# Patient Record
Sex: Female | Born: 1950 | Race: White | Hispanic: No | Smoking: Current every day smoker
Health system: Southern US, Community
[De-identification: ages and names within clinical notes are randomized; demographics above are authoritative.]

## PROBLEM LIST (undated history)

## (undated) DIAGNOSIS — I10 Essential (primary) hypertension: Secondary | ICD-10-CM

## (undated) DIAGNOSIS — T7840XA Allergy, unspecified, initial encounter: Secondary | ICD-10-CM

## (undated) DIAGNOSIS — F1011 Alcohol abuse, in remission: Secondary | ICD-10-CM

## (undated) DIAGNOSIS — F419 Anxiety disorder, unspecified: Secondary | ICD-10-CM

## (undated) DIAGNOSIS — F329 Major depressive disorder, single episode, unspecified: Secondary | ICD-10-CM

## (undated) DIAGNOSIS — Z8616 Personal history of COVID-19: Secondary | ICD-10-CM

## (undated) HISTORY — DX: Personal history of COVID-19: Z86.16

## (undated) HISTORY — DX: Anxiety disorder, unspecified: F41.9

## (undated) HISTORY — PX: COLONOSCOPY: SHX174

## (undated) HISTORY — DX: Essential (primary) hypertension: I10

## (undated) HISTORY — PX: ABLATION: SHX5711

## (undated) HISTORY — PX: DILATION AND CURETTAGE OF UTERUS: SHX78

## (undated) HISTORY — PX: ANAL FISSURE REPAIR: SHX2312

## (undated) HISTORY — DX: Alcohol abuse, in remission: F10.11

## (undated) HISTORY — DX: Allergy, unspecified, initial encounter: T78.40XA

## (undated) HISTORY — PX: TONSILLECTOMY: SUR1361

## (undated) HISTORY — DX: Major depressive disorder, single episode, unspecified: F32.9

---

## 1997-08-27 ENCOUNTER — Other Ambulatory Visit: Admission: RE | Admit: 1997-08-27 | Discharge: 1997-08-27 | Payer: Self-pay | Admitting: Family Medicine

## 1997-11-12 ENCOUNTER — Other Ambulatory Visit: Admission: RE | Admit: 1997-11-12 | Discharge: 1997-11-12 | Payer: Self-pay | Admitting: Family Medicine

## 2000-02-06 ENCOUNTER — Other Ambulatory Visit: Admission: RE | Admit: 2000-02-06 | Discharge: 2000-02-06 | Payer: Self-pay | Admitting: Obstetrics & Gynecology

## 2000-03-31 ENCOUNTER — Ambulatory Visit (HOSPITAL_COMMUNITY): Admission: RE | Admit: 2000-03-31 | Discharge: 2000-03-31 | Payer: Self-pay | Admitting: Obstetrics & Gynecology

## 2000-03-31 ENCOUNTER — Encounter (INDEPENDENT_AMBULATORY_CARE_PROVIDER_SITE_OTHER): Payer: Self-pay

## 2004-02-23 ENCOUNTER — Other Ambulatory Visit: Admission: RE | Admit: 2004-02-23 | Discharge: 2004-02-23 | Payer: Self-pay | Admitting: Obstetrics and Gynecology

## 2004-04-29 ENCOUNTER — Ambulatory Visit: Payer: Self-pay | Admitting: Cardiology

## 2004-07-06 ENCOUNTER — Ambulatory Visit: Payer: Self-pay | Admitting: Cardiology

## 2004-07-14 ENCOUNTER — Ambulatory Visit: Payer: Self-pay | Admitting: Cardiology

## 2004-08-04 ENCOUNTER — Ambulatory Visit: Payer: Self-pay | Admitting: Family Medicine

## 2004-08-16 ENCOUNTER — Ambulatory Visit: Payer: Self-pay | Admitting: Family Medicine

## 2004-10-18 ENCOUNTER — Ambulatory Visit: Payer: Self-pay | Admitting: Cardiology

## 2005-01-20 ENCOUNTER — Ambulatory Visit: Payer: Self-pay | Admitting: Cardiology

## 2005-07-20 ENCOUNTER — Other Ambulatory Visit: Admission: RE | Admit: 2005-07-20 | Discharge: 2005-07-20 | Payer: Self-pay | Admitting: Obstetrics & Gynecology

## 2006-03-08 ENCOUNTER — Encounter: Admission: RE | Admit: 2006-03-08 | Discharge: 2006-03-08 | Payer: Self-pay | Admitting: Obstetrics & Gynecology

## 2007-04-04 ENCOUNTER — Ambulatory Visit: Payer: Self-pay | Admitting: Cardiology

## 2007-11-05 ENCOUNTER — Ambulatory Visit: Payer: Self-pay | Admitting: Family Medicine

## 2007-11-05 DIAGNOSIS — J069 Acute upper respiratory infection, unspecified: Secondary | ICD-10-CM | POA: Insufficient documentation

## 2008-03-25 ENCOUNTER — Ambulatory Visit: Payer: Self-pay | Admitting: Cardiology

## 2009-02-03 ENCOUNTER — Telehealth (INDEPENDENT_AMBULATORY_CARE_PROVIDER_SITE_OTHER): Payer: Self-pay | Admitting: Internal Medicine

## 2009-03-24 ENCOUNTER — Telehealth: Payer: Self-pay | Admitting: Cardiology

## 2009-04-02 ENCOUNTER — Encounter (INDEPENDENT_AMBULATORY_CARE_PROVIDER_SITE_OTHER): Payer: Self-pay | Admitting: *Deleted

## 2009-04-27 DIAGNOSIS — E785 Hyperlipidemia, unspecified: Secondary | ICD-10-CM | POA: Insufficient documentation

## 2009-04-27 DIAGNOSIS — I10 Essential (primary) hypertension: Secondary | ICD-10-CM | POA: Insufficient documentation

## 2009-04-27 HISTORY — DX: Hyperlipidemia, unspecified: E78.5

## 2009-04-27 HISTORY — DX: Essential (primary) hypertension: I10

## 2009-06-09 ENCOUNTER — Encounter (INDEPENDENT_AMBULATORY_CARE_PROVIDER_SITE_OTHER): Payer: Self-pay | Admitting: *Deleted

## 2010-06-14 NOTE — Letter (Signed)
Summary: Appointment - Missed  [facility]     [place], [place]    Phone:   Fax:      June 09, 2009 MRN: [id number]   [facility] [address] [place], [place]  [address]   Dear [name] [name],  Our records indicate you missed your appointment on 06-08-2009 with  Dr. Daleen Squibb  It is very important that we reach you to reschedule this appointment. We look forward to participating in your health care needs. Please contact us at the number listed above at your earliest convenience to reschedule this appointment.     Sincerely,   Lorne Skeens  [facility] Scheduling Team

## 2010-09-27 NOTE — Assessment & Plan Note (Signed)
[facility]                            CARDIOLOGY OFFICE NOTE   NAME:Grotz, SHARYAH BOSTWICK                         MRN:          [id number]  DATE:04/04/2007                            DOB:          1950-06-28    Ms. Teems returns today for further management of her multiple  cardiovascular risk factors.   It has been a couple of years since I have last seen her.   She says don't yell at me 'cause I stopped my statin, and I'm still  smoking.   She has had no symptoms of angina or ischemia.   Unfortunately, her last lipid profile showed a total cholesterol of 244,  HDL of 39, LDL of 178, triglycerides of 134.  She had stopped her  Vytorin last year because she cramped in her legs and was an unhappy  person.   She is still smoking, but has cut back significantly.  She is not  walking on a regular basis.  She says her blood pressure is under good  control.   MEDICATIONS:  1. Diovan 160/25 daily.  2. Vitamin D 1000 units daily.  3. Caltrate 600 mg daily.  4. Fish oil 1000 mg.  5. Flax seed oil.   PHYSICAL EXAMINATION:  VITAL SIGNS:  Her blood pressure today is 138/78,  pulse 78 and regular, weight is 155.  HEENT:  Normocephalic and atraumatic.  Pupils are equal, round and  reactive to light and accommodation.  Extraocular movements intact.  Sclerae are clear.  Facial symmetry is normal.  Carotid upstrokes are  equal bilaterally without bruits.  No JVD.  Thyroid is not enlarged.  Trachea is midline.  LUNGS:  Clear.  HEART:  Soft S1 and S2 without gallops, rubs, or murmurs.  PMI cannot be  appreciated.  EXTREMITIES:  No clubbing, cyanosis, or edema.  Pulses are intact.  NEUROLOGIC:  Intact.   EKG is normal.   ASSESSMENT AND PLAN:  I had a nice chat with Mrs. Reinoso.  I have  recommended 3 hours of walking per week, try Crestor 10 mg a day, which  she had tried in the past and seemed to tolerate, keep blood pressure at  130/80 and monitored on a  regular basis, and cut cigarettes as much as  possible.   Will check lipids and LFT's in about 2 months.  I will see her back in a  year.     Thomas C. Daleen Squibb, MD, [facility]  Electronically Signed    TCW/MedQ  DD: 04/04/2007  DT: 04/04/2007  Job #: [id number]   cc:   Freddy Finner, M.D.

## 2010-09-27 NOTE — Assessment & Plan Note (Signed)
[facility]                            CARDIOLOGY OFFICE NOTE   NAME:[name], [name] IGOE                         MRN:          [id number]  DATE:03/25/2008                            DOB:          1950/07/27    [name] [name] comes in today for followup of her multiple cardiovascular  risk factors.   She took my message to heart and quit smoking.  She has quit for 6  months!   She is stressed because she has gained some weight.  She also recently  developed some problems with her left ankle and has not been able to  walk.   MEDICATIONS:  She is taking her Crestor 10 mg a day, Diovan HCTZ 160/25  daily.  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  She  takes Caltrate and vitamin D.   She denies any orthopnea, PND, peripheral edema, tachy palpitations, or  chest pain.   PHYSICAL EXAMINATION:  VITAL SIGNS:  Her blood pressure today is 141/87  and her pulse is 67 and regular.  HEENT:  Normal.  NECK:  Carotid upstrokes were equal bilaterally without bruits, no JVD.  Thyroid is not enlarged.  Trachea is midline.  LUNGS:  Clear to auscultation and percussion without rhonchi or rales.  HEART:  A poorly appreciated PMI.  Normal S1 and S2.  ABDOMEN:  Soft, good bowel sounds.  No midline bruits.  EXTREMITIES:  No cyanosis, clubbing, or edema.  Pulses are intact.   Her EKG is normal.   I am delighted that [name] [name] is doing so well.  We will have her  return for a fasting CMP and lipids.  She will continue to take her  current medications.  I have encouraged her to try to drop her weight.  Now, that she has been quit smoking for 6 months.  I will see her back  again in 3 months for accountability.     Thomas C. Daleen Squibb, MD, [facility]  Electronically Signed    TCW/MedQ  DD: 03/25/2008  DT: 03/26/2008  Job #: [id number]   cc:   Freddy Finner, M.D.

## 2010-09-30 NOTE — Op Note (Signed)
[facility]  Patient:    [name] [name], [name] [name]                       MRN: [id number] Proc. Date: 03/31/00 Adm. Date:  [id number] Disc. Date: [id number] Attending:  Minette Headland                           Operative Report  PREOPERATIVE DIAGNOSIS:       Postmenopausal bleeding on iron replacement therapy, echogenic focus within the endometrium possibly consistent with a polyp.  Endometrial stripe thickness 6 mm.  POSTOPERATIVE DIAGNOSIS:      Postmenopausal bleeding on iron replacement therapy, echogenic focus within the endometrium possibly consistent with a polyp.  Endometrial stripe thickness 6 mm.  OPERATIVE PROCEDURE:          Hysteroscopy, dilation and curettage.  SURGEON:                      Freddy Finner, M.D.  ANESTHESIA:                   General.  INTRAOPERATIVE COMPLICATIONS:                None.  ESTIMATED BLOOD LOSS:         20 cc.  INTRAOPERATIVE SORBITOL DEFICIT:                      20 cc.  INDICATIONS:                  Patient is a 60 year old gravida 3, para 2, who was started on ______ for hormone replacement therapy and has had an episode of irregular vaginal bleeding.  This persisted for several days.  A pelvic ultrasound was obtained in the office which showed an area of echogenic focus within the uterine cavity possibly consistent with a polyp and showed an endometrial stripe of 6 mm.  Patient is now admitted for hysteroscopy and D&C. Intraoperative findings were obscured by blood clots within the endometrial cavity but no obvious polyps were seen and no obvious abnormality was seen. The uterus itself sounded to 8 to 8.5 cm.  DESCRIPTION OF PROCEDURE:     Following admission, the patient was brought to the operating room, placed under adequate general anesthesia, and placed in the dorsal lithotomy position using the [facility] stirrup system.  Betadine prep was carried out in the usual fashion.  Sterile drapes were  applied.  The cervix was visualized, grasped with a single toothed tenaculum, and progressively dilated with the [facility] dilators to 23.  The 12.5 degree [facility] hysteroscope was introduced using Sorbitol 3% as the distending medium. Examination of the cavity revealed no definite abnormalities, but it was obscured by clots and fibrin deposition.  Gentle thorough curettage and exploration with Randall stone forceps was accomplished.  The cavity then appeared to be normal on reinspection.  The procedure was terminated at this point.  Patient was awakened, taken to recovery in good condition, and will be discharged in the immediate postoperative period to follow up in approximately two weeks.  She is to call for fever, severe pain, or heavy bleeding.  She is to take Darvocet-N 100 as needed for postoperative pain. DD:  04/03/00 TD:  04/03/00 Job: [id number] LKG/MW102

## 2012-11-12 LAB — HM PAP SMEAR: HM Pap smear: NORMAL

## 2013-02-12 ENCOUNTER — Encounter: Payer: Self-pay | Admitting: Gastroenterology

## 2013-04-07 ENCOUNTER — Ambulatory Visit (AMBULATORY_SURGERY_CENTER): Payer: Self-pay

## 2013-04-07 ENCOUNTER — Encounter: Payer: Self-pay | Admitting: Gastroenterology

## 2013-04-07 VITALS — Ht 60.5 in | Wt 152.0 lb

## 2013-04-07 DIAGNOSIS — Z1211 Encounter for screening for malignant neoplasm of colon: Secondary | ICD-10-CM

## 2013-04-07 MED ORDER — SUPREP BOWEL PREP KIT 17.5-3.13-1.6 GM/177ML PO SOLN: 1.0000 | Freq: Once | ORAL | Status: DC

## 2013-04-22 ENCOUNTER — Ambulatory Visit (AMBULATORY_SURGERY_CENTER): Payer: BC Managed Care – PPO | Admitting: Gastroenterology

## 2013-04-22 ENCOUNTER — Encounter: Payer: Self-pay | Admitting: Gastroenterology

## 2013-04-22 VITALS — BP 104/78 | HR 63 | Temp 97.5°F | Resp 25 | Ht 60.0 in | Wt 152.0 lb

## 2013-04-22 DIAGNOSIS — D126 Benign neoplasm of colon, unspecified: Secondary | ICD-10-CM

## 2013-04-22 DIAGNOSIS — K648 Other hemorrhoids: Secondary | ICD-10-CM

## 2013-04-22 DIAGNOSIS — Z1211 Encounter for screening for malignant neoplasm of colon: Secondary | ICD-10-CM

## 2013-04-22 MED ORDER — SODIUM CHLORIDE 0.9 % IV SOLN: 500.0000 mL | INTRAVENOUS | Status: DC

## 2013-04-22 NOTE — Progress Notes (Signed)
Called to room to assist during endoscopic procedure.  Patient ID and intended procedure confirmed with present staff. Received instructions for my participation in the procedure from the performing physician.  

## 2013-04-22 NOTE — Op Note (Addendum)
[facility] [address] [place], [address]   COLONOSCOPY PROCEDURE REPORT  PATIENT: [name] [name], [name] J.  MR#: [id number] BIRTHDATE: Feb 11, 1951 , 62  yrs. old GENDER: Female ENDOSCOPIST: Louis Meckel, MD REFERRED BY: PROCEDURE DATE:  04/22/2013 PROCEDURE:   Colonoscopy with cold biopsy polypectomy First Screening Colonoscopy - Avg.  risk and is 50 yrs.  old or older Yes.  Prior Negative Screening - Now for repeat screening. N/A  History of Adenoma - Now for follow-up colonoscopy & has been > or = to 3 yrs.  N/A  Polyps Removed Today? Yes. ASA CLASS:   Class II INDICATIONS:average risk screening. MEDICATIONS: MAC sedation, administered by CRNA and propofol (Diprivan) 300mg  IV  DESCRIPTION OF PROCEDURE:   After the risks benefits and alternatives of the procedure were thoroughly explained, informed consent was obtained.  A digital rectal exam revealed several skin tags.   The LB [id number]  endoscope was introduced through the anus and advanced to the cecum, which was identified by both the appendix and ileocecal valve. No adverse events experienced. The quality of the prep was Suprep good  The instrument was then slowly withdrawn as the colon was fully examined.      COLON FINDINGS: Two sessile polyps were found at the hepatic flexure.  A polypectomy was performed with cold forceps.   Internal hemorrhoids were found.   The colon was otherwise normal.  There was no diverticulosis, inflammation, polyps or cancers unless previously stated.  Retroflexed views revealed no abnormalities. The time to cecum=4 minutes 28 seconds.  Withdrawal time=9 minutes 19 seconds.  The scope was withdrawn and the procedure completed. COMPLICATIONS: There were no complications.  ENDOSCOPIC IMPRESSION: 1.   Two sessile polyps were found at the hepatic flexure; polypectomy was performed with cold forceps 2.   Internal hemorrhoids 3.   The colon was otherwise  normal  RECOMMENDATIONS: You will receive a letter within 2 weeks with followup recommendations based on the pathology findings. Band ligation of internal hemorrhoids   eSigned:  Louis Meckel, MD 04/30/2013 2:43 PM Revised: 04/30/2013 2:43 PM  cc:   PATIENT NAME:  [name]. MR#: [id number]

## 2013-04-22 NOTE — Progress Notes (Signed)
Report to pacu rn, vss, bbs=clear 

## 2013-04-22 NOTE — Patient Instructions (Signed)

## 2013-04-22 NOTE — Progress Notes (Signed)
Patient did not experience any of the following events: a burn prior to discharge; a fall within the facility; wrong site/side/patient/procedure/implant event; or a hospital transfer or hospital admission upon discharge from the facility. (G8907) Patient did not have preoperative order for IV antibiotic SSI prophylaxis. (G8918)  

## 2013-04-23 ENCOUNTER — Telehealth: Payer: Self-pay | Admitting: *Deleted

## 2013-04-23 NOTE — Telephone Encounter (Signed)
Message copied by Marlowe Kays on Wed Apr 23, 2013  8:21 AM ------      Message from: Melvia Heaps D      Created: Tue Apr 22, 2013 10:40 AM       Please schedule this patient for band ligation ------

## 2013-04-23 NOTE — Telephone Encounter (Signed)
Name identifier, left message, follow-up 

## 2013-04-28 NOTE — Telephone Encounter (Signed)
Letter mailed today of appointment

## 2013-04-30 ENCOUNTER — Encounter: Payer: Self-pay | Admitting: Gastroenterology

## 2013-06-02 ENCOUNTER — Encounter: Payer: Self-pay | Admitting: Gastroenterology

## 2013-06-02 ENCOUNTER — Ambulatory Visit (INDEPENDENT_AMBULATORY_CARE_PROVIDER_SITE_OTHER): Payer: BC Managed Care – PPO | Admitting: Gastroenterology

## 2013-06-02 VITALS — BP 130/80 | HR 72 | Ht 60.25 in | Wt 155.1 lb

## 2013-06-02 DIAGNOSIS — K648 Other hemorrhoids: Secondary | ICD-10-CM | POA: Insufficient documentation

## 2013-06-02 HISTORY — PX: HEMORRHOID BANDING: SHX5850

## 2013-06-02 NOTE — Progress Notes (Signed)
PROCEDURE NOTE: The patient presents with symptomatic grade *2**  hemorrhoids, requesting rubber band ligation of his/her hemorrhoidal disease.  All risks, benefits and alternative forms of therapy were described and informed consent was obtained.   The anorectum was pre-medicated with lubricant and nitroglycerine ointment The decision was made to band the *right anterior** internal hemorrhoid, and the [place] was used to perform band ligation without complication.  Digital anorectal examination was then performed to assure proper positioning of the band, and to adjust the banded tissue as required.  The patient was discharged home without pain or other issues.  Dietary and behavioral recommendations were given and along with follow-up instructions.    The patient will return in *2** for  follow-up and possible additional banding as required. No complications were encountered and the patient tolerated the procedure well.

## 2013-06-02 NOTE — Patient Instructions (Addendum)
You have been given a separate informational sheet regarding your tobacco use, the importance of quitting and local resources to help you quit.  Your 2nd  banding is scheduled on 06/27/2013 at 10:30am  [place]   1. The procedure you have had should have been relatively painless since the banding of the area involved does not have nerve endings and there is no pain sensation.  The rubber band cuts off the blood supply to the hemorrhoid and the band may fall off as soon as 48 hours after the banding (the band may occasionally be seen in the toilet bowl following a bowel movement). You may notice a temporary feeling of fullness in the rectum which should respond adequately to plain Tylenol or Motrin.  2. Following the banding, avoid strenuous exercise that evening and resume full activity the next day.  A sitz bath (soaking in a warm tub) or bidet is soothing, and can be useful for cleansing the area after bowel movements.     3. To avoid constipation, take two tablespoons of natural wheat bran, natural oat bran, flax, Benefiber or any over the counter fiber supplement and increase your water intake to 7-8 glasses daily.    4. Unless you have been prescribed anorectal medication, do not put anything inside your rectum for two weeks: No suppositories, enemas, fingers, etc.  5. Occasionally, you may have more bleeding than usual after the banding procedure.  This is often from the untreated hemorrhoids rather than the treated one.  Don't be concerned if there is a tablespoon or so of blood.  If there is more blood than this, lie flat with your bottom higher than your head and apply an ice pack to the area. If the bleeding does not stop within a half an hour or if you feel faint, call our office at [phone] or go to the emergency room.  6. Problems are not common; however, if there is a substantial amount of bleeding, severe pain, chills, fever or  difficulty passing urine (very rare) or other problems, you should call us at [phone] or report to the nearest emergency room.  7. Do not stay seated continuously for more than 2-3 hours for a day or two after the procedure.  Tighten your buttock muscles 10-15 times every two hours and take 10-15 deep breaths every 1-2 hours.  Do not spend more than a few minutes on the toilet if you cannot empty your bowel; instead re-visit the toilet at a later time.

## 2013-06-27 ENCOUNTER — Encounter: Payer: BC Managed Care – PPO | Admitting: Gastroenterology

## 2013-07-17 ENCOUNTER — Encounter: Payer: Self-pay | Admitting: Internal Medicine

## 2013-07-17 ENCOUNTER — Encounter: Payer: BC Managed Care – PPO | Admitting: Gastroenterology

## 2013-07-17 ENCOUNTER — Ambulatory Visit (INDEPENDENT_AMBULATORY_CARE_PROVIDER_SITE_OTHER): Payer: BC Managed Care – PPO | Admitting: Internal Medicine

## 2013-07-17 VITALS — BP 118/80 | HR 72 | Temp 98.2°F | Ht 60.5 in | Wt 153.0 lb

## 2013-07-17 DIAGNOSIS — Z0000 Encounter for general adult medical examination without abnormal findings: Secondary | ICD-10-CM

## 2013-07-17 DIAGNOSIS — Z23 Encounter for immunization: Secondary | ICD-10-CM

## 2013-07-17 LAB — COMPREHENSIVE METABOLIC PANEL
ALK PHOS: 46 U/L (ref 39–117)
ALT: 26 U/L (ref 0–35)
AST: 21 U/L (ref 0–37)
Albumin: 4.3 g/dL (ref 3.5–5.2)
BILIRUBIN TOTAL: 0.7 mg/dL (ref 0.3–1.2)
BUN: 16 mg/dL (ref 6–23)
CO2: 28 meq/L (ref 19–32)
CREATININE: 0.7 mg/dL (ref 0.4–1.2)
Calcium: 9.7 mg/dL (ref 8.4–10.5)
Chloride: 101 mEq/L (ref 96–112)
GFR: 87.52 mL/min (ref >60.00)
GLUCOSE: 93 mg/dL (ref 70–99)
Potassium: 3.9 mEq/L (ref 3.5–5.1)
Sodium: 136 mEq/L (ref 135–145)
Total Protein: 7.2 g/dL (ref 6.0–8.3)

## 2013-07-17 LAB — LIPID PANEL
Cholesterol: 275 mg/dL — ABNORMAL HIGH (ref 0–200)
HDL: 41 mg/dL (ref >39.00)
LDL Cholesterol: 209 mg/dL — ABNORMAL HIGH (ref 0–99)
TRIGLYCERIDES: 125 mg/dL (ref 0.0–149.0)
Total CHOL/HDL Ratio: 7
VLDL: 25 mg/dL (ref 0.0–40.0)

## 2013-07-17 LAB — CBC
HEMATOCRIT: 42.6 % (ref 36.0–46.0)
HEMOGLOBIN: 14.1 g/dL (ref 12.0–15.0)
MCHC: 33.2 g/dL (ref 30.0–36.0)
MCV: 98.1 fl (ref 78.0–100.0)
Platelets: 239 10*3/uL (ref 150.0–400.0)
RBC: 4.34 Mil/uL (ref 3.87–5.11)
RDW: 13.9 % (ref 11.5–14.6)
WBC: 7.8 10*3/uL (ref 4.5–10.5)

## 2013-07-17 LAB — HEMOGLOBIN A1C: Hgb A1c MFr Bld: 5.8 % (ref 4.6–6.5)

## 2013-07-17 LAB — TSH: TSH: 3.51 u[IU]/mL (ref 0.35–5.50)

## 2013-07-17 MED ORDER — AMLODIPINE-VALSARTAN-HCTZ 5-160-25 MG PO TABS: 1.0000 | ORAL_TABLET | Freq: Every day | ORAL | Status: DC

## 2013-07-17 NOTE — Addendum Note (Signed)
Addended by: Lurlean Nanny on: 07/17/2013 11:52 AM   Modules accepted: Orders

## 2013-07-17 NOTE — Patient Instructions (Addendum)

## 2013-07-17 NOTE — Progress Notes (Signed)
HPI  Pt presents to the clinic today to establish care. [name] [name] is transferring care from Dr. Nori Riis. [name] [name] has no concerns today.  Flu: 2012 Tetanus: more than 10 years ago Zostovax: never Pap Smear: 01/2013  Mammogram: 01/2013 Colon Screening: 05/2013 Eye Doctor: yearly Dentist: as needed   Past Medical History  Diagnosis Date  . Anxiety   . Hypertension   . Allergy     Current Outpatient Prescriptions  Medication Sig Dispense Refill  . ALPRAZolam (XANAX) 0.25 MG tablet Take 0.125 mg by mouth at bedtime as needed for anxiety.       . Amlodipine-Valsartan-HCTZ 5-160-25 MG TABS Take by mouth.      . B Complex-C (SUPER B COMPLEX PO) Take 1 capsule by mouth daily as needed.      . Cholecalciferol (VITAMIN D-3 PO) Take 1 capsule by mouth daily.      . Flaxseed, Linseed, (FLAX SEEDS PO) Take 1 capsule by mouth daily.      . Multiple Vitamin (MULTIVITAMIN) tablet Take 1 tablet by mouth daily.      . Multiple Vitamins-Minerals (HAIR/SKIN/NAILS PO) Take 1 capsule by mouth daily.      . Omega-3 Fatty Acids (FISH OIL) 1000 MG CAPS Take 2 capsules by mouth daily.      Marland Kitchen oxyCODONE-acetaminophen (PERCOCET/ROXICET) 5-325 MG per tablet Take 0.5 tablets by mouth every 4 (four) hours as needed for severe pain (after eye surgery).       No current facility-administered medications for this visit.    Allergies  Allergen Reactions  . Erythromycin     REACTION: GI upset    Family History  Problem Relation Age of Onset  . Colon cancer Neg Hx   . Diabetes Mother   . Stroke Mother   . Other Mother     colon sugery  . Polymyalgia rheumatica Mother   . Heart disease Father   . Diabetes Father     History   Social History  . Marital Status: Married    Spouse Name: N/A    Number of Children: 2  . Years of Education: N/A   Occupational History  . self employed Other   Social History Main Topics  . Smoking status: Current Every Day Smoker    Types: Cigarettes  . Smokeless tobacco: Never  Used  . Alcohol Use: 6.0 oz/week    10 Cans of beer per week  . Drug Use: No  . Sexual Activity: Not on file   Other Topics Concern  . Not on file   Social History Narrative  . No narrative on file    ROS:  Constitutional: Denies fever, malaise, fatigue, headache or abrupt weight changes.  HEENT: Denies eye pain, eye redness, ear pain, ringing in the ears, wax buildup, runny nose, nasal congestion, bloody nose, or sore throat. Respiratory: Denies difficulty breathing, shortness of breath, cough or sputum production.   Cardiovascular: Denies chest pain, chest tightness, palpitations or swelling in the hands or feet.  Gastrointestinal: Denies abdominal pain, bloating, constipation, diarrhea or blood in the stool.  GU: Denies frequency, urgency, pain with urination, blood in urine, odor or discharge. Musculoskeletal: Denies decrease in range of motion, difficulty with gait, muscle pain or joint pain and swelling.  Skin: Denies redness, rashes, lesions or ulcercations.  Neurological: Denies dizziness, difficulty with memory, difficulty with speech or problems with balance and coordination.   No other specific complaints in a complete review of systems (except as listed in HPI above).  PE:  BP 118/80  Pulse 72  Temp(Src) 98.2 F (36.8 C) (Oral)  Ht 5' 0.5" (1.537 m)  Wt 153 lb (69.4 kg)  BMI 29.38 kg/m2  SpO2 99% Wt Readings from Last 3 Encounters:  07/17/13 153 lb (69.4 kg)  06/02/13 155 lb 2 oz (70.364 kg)  04/22/13 152 lb (68.947 kg)    General: Appears her stated age, overweight but well developed, well nourished in NAD. HEENT: Head: normal shape and size; Eyes: sclera white, no icterus, conjunctiva pink, PERRLA and EOMs intact; Ears: Tm's gray and intact, normal light reflex; Nose: mucosa pink and moist, septum midline; Throat/Mouth: Teeth present, mucosa pink and moist, no lesions or ulcerations noted.  Neck: Normal range of motion. Neck supple, trachea midline. No  massses, lumps or thyromegaly present.  Cardiovascular: Normal rate and rhythm. S1,S2 noted.  No murmur, rubs or gallops noted. No JVD or BLE edema. No carotid bruits noted. Pulmonary/Chest: Normal effort and positive vesicular breath sounds. No respiratory distress. No wheezes, rales or ronchi noted.  Abdomen: Soft and nontender. Normal bowel sounds, no bruits noted. No distention or masses noted. Liver, spleen and kidneys non palpable. Musculoskeletal: Normal range of motion. No signs of joint swelling. No difficulty with gait.  Neurological: Alert and oriented. Cranial nerves II-XII intact. Coordination normal. +DTRs bilaterally. Psychiatric: Mood and affect normal. Behavior is normal. Judgment and thought content normal.      Assessment and Plan:  Prevent Health Maintenance:  Pt declines flu vaccine today Will give Tdap today [name] [name] will find out about cost of shingles vaccine Will obtain screening labs today Encouraged her to stop smoking Encouraged her to work on diet and exercise  RTC in 1 year or sooner if needed

## 2013-07-17 NOTE — Progress Notes (Signed)
Pre visit review using our clinic review tool, if applicable. No additional management support is needed unless otherwise documented below in the visit note. 

## 2013-07-17 NOTE — Addendum Note (Signed)
Addended by: Lurlean Nanny on: 07/17/2013 11:55 AM   Modules accepted: Orders

## 2014-01-27 ENCOUNTER — Telehealth: Payer: Self-pay

## 2014-01-27 MED ORDER — VALSARTAN-HYDROCHLOROTHIAZIDE 160-25 MG PO TABS: 1.0000 | ORAL_TABLET | Freq: Every day | ORAL | Status: DC

## 2014-01-27 NOTE — Telephone Encounter (Signed)
Pt left v/m requesting refill valsartan-HCTZ 160-25 to [facility] [place]; spoke with Seth Bake at [facility] and pt has been getting valsartan- HCTZ 160-25; pts med list has amlodipine-losartan-HCTZ 5-160-25.Please advise. Pt request cb when refilled.

## 2014-01-27 NOTE — Telephone Encounter (Signed)
Please refill valsartan hct 160-25. Take other medication off med list

## 2014-01-27 NOTE — Addendum Note (Signed)
Addended by: Lurlean Nanny on: 01/27/2014 11:43 AM   Modules accepted: Orders

## 2014-01-27 NOTE — Telephone Encounter (Signed)
Rx changed and sent to pharmacy as instructed

## 2014-10-22 ENCOUNTER — Encounter: Payer: Self-pay | Admitting: Internal Medicine

## 2014-10-22 ENCOUNTER — Ambulatory Visit (INDEPENDENT_AMBULATORY_CARE_PROVIDER_SITE_OTHER): Payer: 59 | Admitting: Internal Medicine

## 2014-10-22 VITALS — BP 126/82 | HR 71 | Temp 98.3°F | Wt 142.0 lb

## 2014-10-22 DIAGNOSIS — F411 Generalized anxiety disorder: Secondary | ICD-10-CM | POA: Insufficient documentation

## 2014-10-22 DIAGNOSIS — E785 Hyperlipidemia, unspecified: Secondary | ICD-10-CM | POA: Diagnosis not present

## 2014-10-22 DIAGNOSIS — F329 Major depressive disorder, single episode, unspecified: Secondary | ICD-10-CM | POA: Insufficient documentation

## 2014-10-22 DIAGNOSIS — I10 Essential (primary) hypertension: Secondary | ICD-10-CM | POA: Diagnosis not present

## 2014-10-22 DIAGNOSIS — T753XXA Motion sickness, initial encounter: Secondary | ICD-10-CM

## 2014-10-22 DIAGNOSIS — F419 Anxiety disorder, unspecified: Secondary | ICD-10-CM | POA: Insufficient documentation

## 2014-10-22 HISTORY — DX: Generalized anxiety disorder: F41.1

## 2014-10-22 LAB — CBC
HEMATOCRIT: 43.2 % (ref 36.0–46.0)
Hemoglobin: 14.6 g/dL (ref 12.0–15.0)
MCHC: 33.7 g/dL (ref 30.0–36.0)
MCV: 98.5 fl (ref 78.0–100.0)
PLATELETS: 259 10*3/uL (ref 150.0–400.0)
RBC: 4.39 Mil/uL (ref 3.87–5.11)
RDW: 14.1 % (ref 11.5–15.5)
WBC: 9.5 10*3/uL (ref 4.0–10.5)

## 2014-10-22 LAB — COMPREHENSIVE METABOLIC PANEL
ALBUMIN: 4.6 g/dL (ref 3.5–5.2)
ALK PHOS: 56 U/L (ref 39–117)
ALT: 23 U/L (ref 0–35)
AST: 20 U/L (ref 0–37)
BUN: 21 mg/dL (ref 6–23)
CHLORIDE: 100 meq/L (ref 96–112)
CO2: 30 mEq/L (ref 19–32)
Calcium: 9.9 mg/dL (ref 8.4–10.5)
Creatinine, Ser: 0.66 mg/dL (ref 0.40–1.20)
GFR: 96.36 mL/min (ref >60.00)
Glucose, Bld: 95 mg/dL (ref 70–99)
Potassium: 3.7 mEq/L (ref 3.5–5.1)
Sodium: 135 mEq/L (ref 135–145)
TOTAL PROTEIN: 7.4 g/dL (ref 6.0–8.3)
Total Bilirubin: 0.4 mg/dL (ref 0.2–1.2)

## 2014-10-22 LAB — LIPID PANEL
CHOL/HDL RATIO: 6
CHOLESTEROL: 287 mg/dL — AB (ref 0–200)
HDL: 51.4 mg/dL (ref >39.00)
LDL CALC: 219 mg/dL — AB (ref 0–99)
NonHDL: 235.6
Triglycerides: 81 mg/dL (ref 0.0–149.0)
VLDL: 16.2 mg/dL (ref 0.0–40.0)

## 2014-10-22 MED ORDER — ALPRAZOLAM 0.25 MG PO TABS: 0.1250 mg | ORAL_TABLET | Freq: Every evening | ORAL | Status: DC | PRN

## 2014-10-22 MED ORDER — VALSARTAN-HYDROCHLOROTHIAZIDE 160-25 MG PO TABS: 1.0000 | ORAL_TABLET | Freq: Every day | ORAL | Status: DC

## 2014-10-22 MED ORDER — SCOPOLAMINE 1 MG/3DAYS TD PT72: 1.0000 | MEDICATED_PATCH | TRANSDERMAL | Status: DC

## 2014-10-22 NOTE — Patient Instructions (Signed)

## 2014-10-22 NOTE — Progress Notes (Signed)
Subjective:    Patient ID: Golden Circle, female    DOB: 18-Jul-1950, 64 y.o.   MRN: [id number]  HPI  Pt presents to the clinic today to follow up chronic medical condtions:  HTN: She is taking the Valsartan HCT daily as prescribed. She has not noticed any adverse effects. Her BP today is 126/82.  HLD: Her last LDL was 209. She is not on a statin (intolerant) but reports she takes red yeast rice and fish oil. She does try to consume a low fat diet.  Anxiety: Chronic but stable. She lost both of her parents in the last 96 months. She takes Xanax, only 90 tablets in the last 2 years. She does break them in half. She denies depression, SI/HI.  She reports she is going on a month long vacation to [place]. She would like a Scopolamine patch to prevent motion sickness for the flight. Review of Systems  Past Medical History  Diagnosis Date  . Anxiety   . Hypertension   . Allergy     Current Outpatient Prescriptions  Medication Sig Dispense Refill  . ALPRAZolam (XANAX) 0.25 MG tablet Take 0.125 mg by mouth at bedtime as needed for anxiety.     . B Complex-C (SUPER B COMPLEX PO) Take 1 capsule by mouth daily as needed.    . Cholecalciferol (VITAMIN D-3 PO) Take 1 capsule by mouth daily.    . Flaxseed, Linseed, (FLAX SEEDS PO) Take 1 capsule by mouth daily.    . Multiple Vitamin (MULTIVITAMIN) tablet Take 1 tablet by mouth daily.    . Multiple Vitamins-Minerals (HAIR/SKIN/NAILS PO) Take 1 capsule by mouth daily.    . Omega-3 Fatty Acids (FISH OIL) 1000 MG CAPS Take 2 capsules by mouth daily.    Marland Kitchen oxyCODONE-acetaminophen (PERCOCET/ROXICET) 5-325 MG per tablet Take 0.5 tablets by mouth every 4 (four) hours as needed for severe pain (after eye surgery).    . valsartan-hydrochlorothiazide (DIOVAN-HCT) 160-25 MG per tablet Take 1 tablet by mouth daily. 90 tablet 2   No current facility-administered medications for this visit.    Allergies  Allergen Reactions  . Erythromycin    REACTION: GI upset    Family History  Problem Relation Age of Onset  . Colon cancer Neg Hx   . Cancer Neg Hx   . Diabetes Mother   . Stroke Mother   . Other Mother     colon sugery  . Polymyalgia rheumatica Mother   . Heart disease Father   . Diabetes Father     History   Social History  . Marital Status: Married    Spouse Name: N/A  . Number of Children: 2  . Years of Education: N/A   Occupational History  . self employed Other   Social History Main Topics  . Smoking status: Current Every Day Smoker -- 0.75 packs/day    Types: Cigarettes  . Smokeless tobacco: Never Used  . Alcohol Use: 6.0 oz/week    10 Cans of beer per week  . Drug Use: No  . Sexual Activity: Not on file   Other Topics Concern  . Not on file   Social History Narrative     Constitutional: Denies fever, malaise, fatigue, headache or abrupt weight changes.  Respiratory: Denies difficulty breathing, shortness of breath, cough or sputum production.   Cardiovascular: Denies chest pain, chest tightness, palpitations or swelling in the hands or feet.  Skin: Denies redness, rashes, lesions or ulcercations.  Neurological: Denies dizziness, difficulty with memory,  difficulty with speech or problems with balance and coordination.  Psych: Pt reports anxiety. Denies depression, SI/HI.  No other specific complaints in a complete review of systems (except as listed in HPI above).     Objective:   Physical Exam  BP 126/82 mmHg  Pulse 71  Temp(Src) 98.3 F (36.8 C) (Oral)  Wt 142 lb (64.411 kg)  SpO2 99% Wt Readings from Last 3 Encounters:  10/22/14 142 lb (64.411 kg)  07/17/13 153 lb (69.4 kg)  06/02/13 155 lb 2 oz (70.364 kg)    General: Appears her stated age, well developed, well nourished in NAD. Skin: Warm, dry and intact. No rashes, lesions or ulcerations noted. Cardiovascular: Normal rate and rhythm. S1,S2 noted.  No murmur, rubs or gallops noted. No BLE edema. No carotid bruits  noted. Pulmonary/Chest: Normal effort and positive vesicular breath sounds. No respiratory distress. No wheezes, rales or ronchi noted.  eys non palpable. Neurological: Alert and oriented.  Psychiatric: Mood and affect normal. Behavior is normal. Judgment and thought content normal.     BMET    Component Value Date/Time   NA 136 07/17/2013 1154   K 3.9 07/17/2013 1154   CL 101 07/17/2013 1154   CO2 28 07/17/2013 1154   GLUCOSE 93 07/17/2013 1154   BUN 16 07/17/2013 1154   CREATININE 0.7 07/17/2013 1154   CALCIUM 9.7 07/17/2013 1154    Lipid Panel     Component Value Date/Time   CHOL 275* 07/17/2013 1154   TRIG 125.0 07/17/2013 1154   HDL 41.00 07/17/2013 1154   CHOLHDL 7 07/17/2013 1154   VLDL 25.0 07/17/2013 1154   LDLCALC 209* 07/17/2013 1154    CBC    Component Value Date/Time   WBC 7.8 07/17/2013 1154   RBC 4.34 07/17/2013 1154   HGB 14.1 07/17/2013 1154   HCT 42.6 07/17/2013 1154   PLT 239.0 07/17/2013 1154   MCV 98.1 07/17/2013 1154   MCHC 33.2 07/17/2013 1154   RDW 13.9 07/17/2013 1154    Hgb A1C Lab Results  Component Value Date   HGBA1C 5.8 07/17/2013         Assessment & Plan:   Motion Sickness:  eRx for Scopolamine patch  Make an appt for your annual exam, RTC in 6 months to follow up chronic conditions

## 2014-10-22 NOTE — Assessment & Plan Note (Signed)
Well controlled on Diovan HCT Will check CBC and CMET today Medication refilled per request

## 2014-10-22 NOTE — Progress Notes (Signed)
Pre visit review using our clinic review tool, if applicable. No additional management support is needed unless otherwise documented below in the visit note. 

## 2014-10-22 NOTE — Assessment & Plan Note (Signed)
LDL not a goal She is statin intolerant Will check Lipid profile and CMET today Continue red yeast rice and fish oil

## 2014-10-22 NOTE — Assessment & Plan Note (Signed)
Controlled with prn Xanax CMET today  Medication refilled.

## 2014-12-03 ENCOUNTER — Telehealth: Payer: Self-pay

## 2014-12-03 NOTE — Telephone Encounter (Signed)
Called patient but was unable to reach. I left a voicemail notifying patient that she was due for a mammogram. Will await call back.

## 2014-12-24 ENCOUNTER — Other Ambulatory Visit: Payer: Self-pay | Admitting: Internal Medicine

## 2014-12-24 DIAGNOSIS — Z0000 Encounter for general adult medical examination without abnormal findings: Secondary | ICD-10-CM

## 2014-12-30 ENCOUNTER — Other Ambulatory Visit (INDEPENDENT_AMBULATORY_CARE_PROVIDER_SITE_OTHER): Payer: 59

## 2014-12-30 DIAGNOSIS — Z0000 Encounter for general adult medical examination without abnormal findings: Secondary | ICD-10-CM

## 2014-12-30 LAB — LIPID PANEL
CHOL/HDL RATIO: 7
Cholesterol: 281 mg/dL — ABNORMAL HIGH (ref 0–200)
HDL: 41 mg/dL (ref >39.00)
LDL Cholesterol: 212 mg/dL — ABNORMAL HIGH (ref 0–99)
NONHDL: 240.3
TRIGLYCERIDES: 144 mg/dL (ref 0.0–149.0)
VLDL: 28.8 mg/dL (ref 0.0–40.0)

## 2014-12-30 LAB — CBC
HEMATOCRIT: 43.8 % (ref 36.0–46.0)
HEMOGLOBIN: 14.9 g/dL (ref 12.0–15.0)
MCHC: 34 g/dL (ref 30.0–36.0)
MCV: 98 fl (ref 78.0–100.0)
PLATELETS: 260 10*3/uL (ref 150.0–400.0)
RBC: 4.47 Mil/uL (ref 3.87–5.11)
RDW: 14 % (ref 11.5–15.5)
WBC: 7.5 10*3/uL (ref 4.0–10.5)

## 2014-12-30 LAB — COMPREHENSIVE METABOLIC PANEL
ALT: 18 U/L (ref 0–35)
AST: 19 U/L (ref 0–37)
Albumin: 4.4 g/dL (ref 3.5–5.2)
Alkaline Phosphatase: 49 U/L (ref 39–117)
BILIRUBIN TOTAL: 0.6 mg/dL (ref 0.2–1.2)
BUN: 18 mg/dL (ref 6–23)
CALCIUM: 9.8 mg/dL (ref 8.4–10.5)
CO2: 30 meq/L (ref 19–32)
Chloride: 100 mEq/L (ref 96–112)
Creatinine, Ser: 0.7 mg/dL (ref 0.40–1.20)
GFR: 89.98 mL/min (ref >60.00)
GLUCOSE: 109 mg/dL — AB (ref 70–99)
POTASSIUM: 3.9 meq/L (ref 3.5–5.1)
Sodium: 137 mEq/L (ref 135–145)
Total Protein: 7.2 g/dL (ref 6.0–8.3)

## 2015-01-04 ENCOUNTER — Encounter: Payer: 59 | Admitting: Internal Medicine

## 2015-01-08 ENCOUNTER — Ambulatory Visit (INDEPENDENT_AMBULATORY_CARE_PROVIDER_SITE_OTHER): Payer: 59 | Admitting: Internal Medicine

## 2015-01-08 ENCOUNTER — Encounter: Payer: Self-pay | Admitting: Internal Medicine

## 2015-01-08 VITALS — BP 118/80 | HR 78 | Temp 98.2°F | Ht 60.0 in | Wt 143.0 lb

## 2015-01-08 DIAGNOSIS — E785 Hyperlipidemia, unspecified: Secondary | ICD-10-CM

## 2015-01-08 DIAGNOSIS — Z0000 Encounter for general adult medical examination without abnormal findings: Secondary | ICD-10-CM | POA: Diagnosis not present

## 2015-01-08 DIAGNOSIS — R7301 Impaired fasting glucose: Secondary | ICD-10-CM

## 2015-01-08 NOTE — Patient Instructions (Signed)

## 2015-01-08 NOTE — Progress Notes (Signed)
Subjective:    Patient ID: Golden Circle, female    DOB: 10-31-1950, 64 y.o.   MRN: [id number]  HPI  Pt presents to the clinic today for her annual exam.  Flu: never Tetanus: 07/2013 Zostovax: never, but would like to get one Pap Smear: 01/2013 Mammogram: 01/2013 Bone Density: 01/2013 Colon Screening: 04/2013 Vision Screening: biannually Dentist: annually  Diet: She eats fish twice a week. She does eat fruits and veggies. She tries to avoid fried foods. She drinks mostly water. Exercise: She has been getting 10000 steps in on the weekends  She does continue to smoke and is not ready to quit.  Review of Systems      Past Medical History  Diagnosis Date  . Anxiety   . Hypertension   . Allergy     Current Outpatient Prescriptions  Medication Sig Dispense Refill  . ALPRAZolam (XANAX) 0.25 MG tablet Take 0.5 tablets (0.125 mg total) by mouth at bedtime as needed for anxiety. 30 tablet 0  . B Complex-C (SUPER B COMPLEX PO) Take 1 capsule by mouth daily as needed.    . Cholecalciferol (VITAMIN D-3 PO) Take 1 capsule by mouth daily.    . Flaxseed, Linseed, (FLAX SEEDS PO) Take 1 capsule by mouth daily.    . Multiple Vitamin (MULTIVITAMIN) tablet Take 1 tablet by mouth daily.    . Multiple Vitamins-Minerals (HAIR/SKIN/NAILS PO) Take 1 capsule by mouth daily.    . Omega-3 Fatty Acids (FISH OIL) 1000 MG CAPS Take 2 capsules by mouth daily.    Marland Kitchen oxyCODONE-acetaminophen (PERCOCET/ROXICET) 5-325 MG per tablet Take 0.5 tablets by mouth every 4 (four) hours as needed for severe pain (after eye surgery).    Marland Kitchen scopolamine (TRANSDERM-SCOP) 1 MG/3DAYS Place 1 patch (1.5 mg total) onto the skin every 3 (three) days. 10 patch 0  . valsartan-hydrochlorothiazide (DIOVAN-HCT) 160-25 MG per tablet Take 1 tablet by mouth daily. 90 tablet 0   No current facility-administered medications for this visit.    Allergies  Allergen Reactions  . Erythromycin     REACTION: GI upset    Family History    Problem Relation Age of Onset  . Colon cancer Neg Hx   . Cancer Neg Hx   . Diabetes Mother   . Stroke Mother   . Other Mother     colon sugery  . Polymyalgia rheumatica Mother   . Heart disease Father   . Diabetes Father     Social History   Social History  . Marital Status: Married    Spouse Name: N/A  . Number of Children: 2  . Years of Education: N/A   Occupational History  . self employed Other   Social History Main Topics  . Smoking status: Current Every Day Smoker -- 0.75 packs/day    Types: Cigarettes  . Smokeless tobacco: Never Used  . Alcohol Use: 6.0 oz/week    10 Cans of beer per week  . Drug Use: No  . Sexual Activity: Not on file   Other Topics Concern  . Not on file   Social History Narrative     Constitutional: Denies fever, malaise, fatigue, headache or abrupt weight changes.  HEENT: Denies eye pain, eye redness, ear pain, ringing in the ears, wax buildup, runny nose, nasal congestion, bloody nose, or sore throat. Respiratory: Denies difficulty breathing, shortness of breath, cough or sputum production.   Cardiovascular: Denies chest pain, chest tightness, palpitations or swelling in the hands or feet.  Gastrointestinal: Denies  abdominal pain, bloating, constipation, diarrhea or blood in the stool.  GU: Denies urgency, frequency, pain with urination, burning sensation, blood in urine, odor or discharge. Musculoskeletal: Denies decrease in range of motion, difficulty with gait, muscle pain or joint pain and swelling.  Skin: Denies redness, rashes, lesions or ulcercations.  Neurological: Denies dizziness, difficulty with memory, difficulty with speech or problems with balance and coordination.  Psych: Denies anxiety, depression, SI/HI.  No other specific complaints in a complete review of systems (except as listed in HPI above).  Objective:   Physical Exam BP 118/80 mmHg  Pulse 78  Temp(Src) 98.2 F (36.8 C) (Oral)  Ht 5' (1.524 m)  Wt 143 lb  (64.864 kg)  BMI 27.93 kg/m2  SpO2 98% Wt Readings from Last 3 Encounters:  01/08/15 143 lb (64.864 kg)  10/22/14 142 lb (64.411 kg)  07/17/13 153 lb (69.4 kg)    General: Appears her stated age, obese in NAD. Skin: Warm, dry and intact. No rashes, lesions or ulcerations noted. HEENT: Head: normal shape and size; Eyes: sclera white, no icterus, conjunctiva pink, PERRLA and EOMs intact; Ears: Tm's gray and intact, normal light reflex; Throat/Mouth: Teeth present, mucosa pink and moist, no exudate, lesions or ulcerations noted.  Neck: Neck supple, trachea midline. No masses, lumps or thyromegaly present.  Cardiovascular: Normal rate and rhythm. S1,S2 noted.  No murmur, rubs or gallops noted. No JVD or BLE edema. No carotid bruits noted. Pulmonary/Chest: Normal effort and positive vesicular breath sounds with decreased expiratory phase. No respiratory distress. No wheezes, rales or ronchi noted.  Abdomen: Soft and nontender. Normal bowel sounds, no bruits noted. No distention or masses noted. Liver, spleen and kidneys non palpable. Musculoskeletal: Normal range of motion. Strength 5/5 BUE/BLE. No difficulty with gait.  Neurological: Alert and oriented. Cranial nerves II-XII grossly intact. Coordination normal.  Psychiatric: Mood and affect normal. Behavior is normal. Judgment and thought content normal.     BMET    Component Value Date/Time   NA 137 12/30/2014 0927   K 3.9 12/30/2014 0927   CL 100 12/30/2014 0927   CO2 30 12/30/2014 0927   GLUCOSE 109* 12/30/2014 0927   BUN 18 12/30/2014 0927   CREATININE 0.70 12/30/2014 0927   CALCIUM 9.8 12/30/2014 0927    Lipid Panel     Component Value Date/Time   CHOL 281* 12/30/2014 0927   TRIG 144.0 12/30/2014 0927   HDL 41.00 12/30/2014 0927   CHOLHDL 7 12/30/2014 0927   VLDL 28.8 12/30/2014 0927   LDLCALC 212* 12/30/2014 0927    CBC    Component Value Date/Time   WBC 7.5 12/30/2014 0927   RBC 4.47 12/30/2014 0927   HGB 14.9  12/30/2014 0927   HCT 43.8 12/30/2014 0927   PLT 260.0 12/30/2014 0927   MCV 98.0 12/30/2014 0927   MCHC 34.0 12/30/2014 0927   RDW 14.0 12/30/2014 0927    Hgb A1C Lab Results  Component Value Date   HGBA1C 5.8 07/17/2013          Assessment & Plan:   Preventative Health Maintenance:  Advised her to get a flu shot in the fall of 2016 She will call insurance company to see where her shingles vaccine is covered Tetanus UTD Pap Smear due 2019 She will call and schedule an appt for mammogram and bone density - done at [facility] CBC, CMET and Lipid profile from 8/17 reviewed- advised her to consume a low fat diet (will recheck lipid profile and A1C in 3  months) Encouraged her to continue seeing an eye doctor and dentist annually  RTC in 4 months to follow up chronic conditions

## 2015-01-08 NOTE — Progress Notes (Signed)
Pre visit review using our clinic review tool, if applicable. No additional management support is needed unless otherwise documented below in the visit note. 

## 2015-01-21 ENCOUNTER — Other Ambulatory Visit: Payer: Self-pay | Admitting: Internal Medicine

## 2015-04-13 ENCOUNTER — Other Ambulatory Visit (INDEPENDENT_AMBULATORY_CARE_PROVIDER_SITE_OTHER): Payer: 59

## 2015-04-13 ENCOUNTER — Encounter: Payer: Self-pay | Admitting: Radiology

## 2015-04-13 DIAGNOSIS — R7301 Impaired fasting glucose: Secondary | ICD-10-CM

## 2015-04-13 DIAGNOSIS — E785 Hyperlipidemia, unspecified: Secondary | ICD-10-CM | POA: Diagnosis not present

## 2015-04-13 LAB — LIPID PANEL
CHOL/HDL RATIO: 7
Cholesterol: 262 mg/dL — ABNORMAL HIGH (ref 0–200)
HDL: 39.9 mg/dL (ref >39.00)
LDL CALC: 196 mg/dL — AB (ref 0–99)
NonHDL: 221.79
TRIGLYCERIDES: 127 mg/dL (ref 0.0–149.0)
VLDL: 25.4 mg/dL (ref 0.0–40.0)

## 2015-04-13 LAB — HEMOGLOBIN A1C: Hgb A1c MFr Bld: 5.7 % (ref 4.6–6.5)

## 2015-04-29 ENCOUNTER — Encounter: Payer: Self-pay | Admitting: Internal Medicine

## 2015-07-12 ENCOUNTER — Ambulatory Visit: Payer: 59 | Admitting: Internal Medicine

## 2015-08-03 ENCOUNTER — Other Ambulatory Visit: Payer: Self-pay | Admitting: Internal Medicine

## 2016-01-28 ENCOUNTER — Other Ambulatory Visit: Payer: Self-pay | Admitting: Internal Medicine

## 2016-04-28 ENCOUNTER — Other Ambulatory Visit: Payer: Self-pay

## 2016-04-28 MED ORDER — VALSARTAN-HYDROCHLOROTHIAZIDE 160-25 MG PO TABS: 1.0000 | ORAL_TABLET | Freq: Every day | ORAL | 0 refills | Status: DC

## 2016-04-28 NOTE — Telephone Encounter (Signed)
Pt request refill valsartan HCTZ to [facility]. Pt has annual scheduled 05/10/16; last annual 01/08/15. Pt needs 90 days due to ins. Refilled x 1 and pt will keep appt.

## 2016-05-10 ENCOUNTER — Encounter: Payer: Self-pay | Admitting: Internal Medicine

## 2016-05-10 ENCOUNTER — Ambulatory Visit (INDEPENDENT_AMBULATORY_CARE_PROVIDER_SITE_OTHER): Payer: BLUE CROSS/BLUE SHIELD | Admitting: Internal Medicine

## 2016-05-10 VITALS — BP 132/80 | HR 65 | Temp 98.3°F | Ht 60.0 in | Wt 135.5 lb

## 2016-05-10 DIAGNOSIS — Z23 Encounter for immunization: Secondary | ICD-10-CM | POA: Diagnosis not present

## 2016-05-10 DIAGNOSIS — Z1239 Encounter for other screening for malignant neoplasm of breast: Secondary | ICD-10-CM

## 2016-05-10 DIAGNOSIS — Z1159 Encounter for screening for other viral diseases: Secondary | ICD-10-CM | POA: Diagnosis not present

## 2016-05-10 DIAGNOSIS — Z0000 Encounter for general adult medical examination without abnormal findings: Secondary | ICD-10-CM | POA: Diagnosis not present

## 2016-05-10 DIAGNOSIS — I10 Essential (primary) hypertension: Secondary | ICD-10-CM | POA: Diagnosis not present

## 2016-05-10 DIAGNOSIS — E7800 Pure hypercholesterolemia, unspecified: Secondary | ICD-10-CM | POA: Diagnosis not present

## 2016-05-10 DIAGNOSIS — Z1231 Encounter for screening mammogram for malignant neoplasm of breast: Secondary | ICD-10-CM

## 2016-05-10 DIAGNOSIS — F411 Generalized anxiety disorder: Secondary | ICD-10-CM | POA: Diagnosis not present

## 2016-05-10 DIAGNOSIS — Z114 Encounter for screening for human immunodeficiency virus [HIV]: Secondary | ICD-10-CM

## 2016-05-10 MED ORDER — TRAMADOL HCL 50 MG PO TABS: 50.0000 mg | ORAL_TABLET | Freq: Three times a day (TID) | ORAL | 0 refills | Status: DC | PRN

## 2016-05-10 MED ORDER — ALPRAZOLAM 0.25 MG PO TABS: 0.1250 mg | ORAL_TABLET | Freq: Every evening | ORAL | 0 refills | Status: DC | PRN

## 2016-05-10 NOTE — Assessment & Plan Note (Signed)
Controlled on Diovan Will monitor CMET today

## 2016-05-10 NOTE — Patient Instructions (Signed)

## 2016-05-10 NOTE — Addendum Note (Signed)
Addended by: Lurlean Nanny on: 05/10/2016 05:02 PM   Modules accepted: Orders

## 2016-05-10 NOTE — Assessment & Plan Note (Signed)
Will check CMET and lipid profile today Encouraged her to consume a low fat diet If LDL > 130, will discuss statin therapy again Continue Red Yeast Rice for now

## 2016-05-10 NOTE — Progress Notes (Signed)
Subjective:    Patient ID: [name] [name], female    DOB: 1950-11-17, 65 y.o.   MRN: [id number]  HPI  Pt presents to the clinic today for her annual exam. She is also due to follow up chronic conditions.  Seasonal Allergies: Worse in the spring and fall. She does Claritin and Zyrtec OTC as needed  Anxiety: She takes Xanax very rarely. She is requesting a refill of the Xanax today.  HTN: She is taking Diovan as prescribed. Her BP today is 132/80. ECG from 10/2014 reviewed.  HLD: Her last LDL was 196, 03/2015. She is taking Red Yeast Rice OTC. I asked her to start Rosuvastatin, but she never contacted me back about this. She does try to consume a low fat diet.  Flu: never Tetanus: 07/2013 Zostovax: never Pap Smear: 11/2012 Mammogram: 2007 in [place], but she reports she had one in 2014 Bone Density: 2014 per her report Colon Screening: 04/2013 Vision Screening: biannually Dentist: annually  Diet: She does eat meat. She consumes fruits and veggies daily. She tries to avoid fried food. She drinks mostly green tea. Exercise: None.  Review of Systems      Past Medical History:  Diagnosis Date  . Allergy   . Anxiety   . Hypertension     Current Outpatient Prescriptions  Medication Sig Dispense Refill  . ALPRAZolam (XANAX) 0.25 MG tablet Take 0.5 tablets (0.125 mg total) by mouth at bedtime as needed for anxiety. 30 tablet 0  . aspirin 81 MG tablet Take 81 mg by mouth daily.    . Red Yeast Rice Extract (RED YEAST RICE PO) Take 1 capsule by mouth daily.    . traMADol (ULTRAM-ER) 100 MG 24 hr tablet Take 100 mg by mouth daily as needed for pain.    . valsartan-hydrochlorothiazide (DIOVAN-HCT) 160-25 MG tablet Take 1 tablet by mouth daily. 90 tablet 0   No current facility-administered medications for this visit.     Allergies  Allergen Reactions  . Erythromycin     REACTION: GI upset    Family History  Problem Relation Age of Onset  . Colon cancer Neg Hx   . Cancer Neg Hx    . Diabetes Mother   . Stroke Mother   . Other Mother     colon sugery  . Polymyalgia rheumatica Mother   . Heart disease Father   . Diabetes Father     Social History   Social History  . Marital status: Married    Spouse name: N/A  . Number of children: 2  . Years of education: N/A   Occupational History  . self employed Other   Social History Main Topics  . Smoking status: Current Every Day Smoker    Packs/day: 0.75    Types: Cigarettes  . Smokeless tobacco: Never Used  . Alcohol use 6.0 oz/week    10 Cans of beer per week  . Drug use: No  . Sexual activity: Not on file   Other Topics Concern  . Not on file   Social History Narrative  . No narrative on file     Constitutional: Denies fever, malaise, fatigue, headache or abrupt weight changes.  HEENT: Denies eye pain, eye redness, ear pain, ringing in the ears, wax buildup, runny nose, nasal congestion, bloody nose, or sore throat. Respiratory: Denies difficulty breathing, shortness of breath, cough or sputum production.   Cardiovascular: Denies chest pain, chest tightness, palpitations or swelling in the hands or feet.  Gastrointestinal: Denies  abdominal pain, bloating, constipation, diarrhea or blood in the stool.  GU: Denies urgency, frequency, pain with urination, burning sensation, blood in urine, odor or discharge. Musculoskeletal: Denies decrease in range of motion, difficulty with gait, muscle pain or joint pain and swelling.  Skin: Denies redness, rashes, lesions or ulcercations.  Neurological: Denies dizziness, difficulty with memory, difficulty with speech or problems with balance and coordination.  Psych: Pt has history of anxiety. Denies depression, SI/HI.  No other specific complaints in a complete review of systems (except as listed in HPI above).  Objective:   Physical Exam   BP 132/80   Pulse 65   Temp 98.3 F (36.8 C) (Oral)   Ht 5' (1.524 m)   Wt 135 lb 8 oz (61.5 kg)   SpO2 98%   BMI  26.46 kg/m  Wt Readings from Last 3 Encounters:  05/10/16 135 lb 8 oz (61.5 kg)  01/08/15 143 lb (64.9 kg)  10/22/14 142 lb (64.4 kg)    General: Appears her stated age, well developed, well nourished in NAD. Skin: Warm, dry and intact.  HEENT: Head: normal shape and size; Eyes: sclera white, no icterus, conjunctiva pink, PERRLA and EOMs intact; Ears: Tm's gray and intact, normal light reflex, bilateral serous effusion bilaterally; Throat/Mouth: Teeth present, mucosa pink and moist, no exudate, lesions or ulcerations noted.  Neck:  Neck supple, trachea midline. No masses, lumps or thyromegaly present.  Cardiovascular: Normal rate and rhythm. S1,S2 noted.  No murmur, rubs or gallops noted. No JVD or BLE edema. No carotid bruits noted. Pulmonary/Chest: Normal effort and positive vesicular breath sounds. No respiratory distress. No wheezes, rales or ronchi noted.  Abdomen: Soft and nontender. Normal bowel sounds. No distention or masses noted. Liver, spleen and kidneys non palpable. Musculoskeletal: Normal range of motion. Strength 5/5 BUE/BLE. No difficulty with gait.  Neurological: Alert and oriented. Cranial nerves II-XII grossly intact. Coordination normal.  Psychiatric: Mood and affect normal. Behavior is normal. Judgment and thought content normal.     BMET    Component Value Date/Time   NA 137 12/30/2014 0927   K 3.9 12/30/2014 0927   CL 100 12/30/2014 0927   CO2 30 12/30/2014 0927   GLUCOSE 109 (H) 12/30/2014 0927   BUN 18 12/30/2014 0927   CREATININE 0.70 12/30/2014 0927   CALCIUM 9.8 12/30/2014 0927    Lipid Panel     Component Value Date/Time   CHOL 262 (H) 04/13/2015 0837   TRIG 127.0 04/13/2015 0837   HDL 39.90 04/13/2015 0837   CHOLHDL 7 04/13/2015 0837   VLDL 25.4 04/13/2015 0837   LDLCALC 196 (H) 04/13/2015 0837    CBC    Component Value Date/Time   WBC 7.5 12/30/2014 0927   RBC 4.47 12/30/2014 0927   HGB 14.9 12/30/2014 0927   HCT 43.8 12/30/2014 0927     PLT 260.0 12/30/2014 0927   MCV 98.0 12/30/2014 0927   MCHC 34.0 12/30/2014 0927   RDW 14.0 12/30/2014 0927    Hgb A1C Lab Results  Component Value Date   HGBA1C 5.7 04/13/2015           Assessment & Plan:   Preventative Health Maintenance:  Flu shot today Tetanus UTD She will call insurance company about shingles vaccine Mammogram ordered- she will call to schedule Pap smear and bone density due 2019 Colon Screening UTD Encouraged her to consume a balanced diet and exercise regimen Advised her to see an eye doctor and dentist annually Will check CBC, CMET,  Lipid, HIV and Hep C today  RTC in 1 year, sooner if needed Webb Silversmith, NP

## 2016-05-10 NOTE — Assessment & Plan Note (Signed)
Chronic but stable on Xanax Will monitor for now

## 2016-05-11 LAB — COMPREHENSIVE METABOLIC PANEL
ALBUMIN: 4.4 g/dL (ref 3.5–5.2)
ALK PHOS: 52 U/L (ref 39–117)
ALT: 17 U/L (ref 0–35)
AST: 19 U/L (ref 0–37)
BUN: 12 mg/dL (ref 6–23)
CO2: 31 mEq/L (ref 19–32)
Calcium: 9.6 mg/dL (ref 8.4–10.5)
Chloride: 99 mEq/L (ref 96–112)
Creatinine, Ser: 0.7 mg/dL (ref 0.40–1.20)
GFR: 89.58 mL/min (ref >60.00)
GLUCOSE: 87 mg/dL (ref 70–99)
POTASSIUM: 3.8 meq/L (ref 3.5–5.1)
SODIUM: 137 meq/L (ref 135–145)
TOTAL PROTEIN: 7.1 g/dL (ref 6.0–8.3)
Total Bilirubin: 0.5 mg/dL (ref 0.2–1.2)

## 2016-05-11 LAB — LIPID PANEL
CHOLESTEROL: 267 mg/dL — AB (ref 0–200)
HDL: 50 mg/dL (ref >39.00)
LDL Cholesterol: 195 mg/dL — ABNORMAL HIGH (ref 0–99)
NONHDL: 217.01
Total CHOL/HDL Ratio: 5
Triglycerides: 109 mg/dL (ref 0.0–149.0)
VLDL: 21.8 mg/dL (ref 0.0–40.0)

## 2016-05-11 LAB — HIV ANTIBODY (ROUTINE TESTING W REFLEX): HIV: NONREACTIVE

## 2016-05-11 LAB — CBC
HEMATOCRIT: 40 % (ref 36.0–46.0)
Hemoglobin: 13.8 g/dL (ref 12.0–15.0)
MCHC: 34.4 g/dL (ref 30.0–36.0)
MCV: 97.1 fl (ref 78.0–100.0)
Platelets: 225 10*3/uL (ref 150.0–400.0)
RBC: 4.12 Mil/uL (ref 3.87–5.11)
RDW: 13.6 % (ref 11.5–15.5)
WBC: 7.6 10*3/uL (ref 4.0–10.5)

## 2016-05-11 LAB — HEPATITIS C ANTIBODY: HCV AB: NEGATIVE

## 2016-05-12 MED ORDER — ROSUVASTATIN CALCIUM 10 MG PO TABS: 10.0000 mg | ORAL_TABLET | Freq: Every day | ORAL | 2 refills | Status: DC

## 2016-05-12 NOTE — Addendum Note (Signed)
Addended by: Lurlean Nanny on: 05/12/2016 04:13 PM   Modules accepted: Orders

## 2016-05-30 NOTE — Addendum Note (Signed)
Addended by: Lurlean Nanny on: 05/30/2016 09:36 AM   Modules accepted: Orders

## 2016-06-14 ENCOUNTER — Other Ambulatory Visit: Payer: Self-pay

## 2016-06-14 MED ORDER — ROSUVASTATIN CALCIUM 10 MG PO TABS: 10.0000 mg | ORAL_TABLET | Freq: Every day | ORAL | 0 refills | Status: DC

## 2016-06-14 NOTE — Telephone Encounter (Signed)
Pt left v/m; pt traveling and request # 90 for rosuvastatin. Done and spoke with Bea at [facility] and cancelled the # 30 with refills . Left detailed message on v/m per DPR.

## 2016-07-31 ENCOUNTER — Other Ambulatory Visit: Payer: Self-pay | Admitting: Internal Medicine

## 2016-09-12 ENCOUNTER — Other Ambulatory Visit: Payer: Self-pay | Admitting: Internal Medicine

## 2016-11-30 ENCOUNTER — Telehealth: Payer: Self-pay

## 2016-11-30 MED ORDER — LOSARTAN POTASSIUM-HCTZ 100-25 MG PO TABS: 1.0000 | ORAL_TABLET | Freq: Every day | ORAL | 0 refills | Status: DC

## 2016-11-30 NOTE — Telephone Encounter (Signed)
Change to Losartan HCT 100-25 mg 1 tab PO daily

## 2016-11-30 NOTE — Addendum Note (Signed)
Addended by: Lurlean Nanny on: 11/30/2016 05:17 PM   Modules accepted: Orders

## 2016-11-30 NOTE — Telephone Encounter (Signed)
Pt is currently on Valsartan HCT, she would like a substitute for this prescription.

## 2016-11-30 NOTE — Telephone Encounter (Signed)
Pt is aware as instructed--- Rx sent through e-scribe and pt wanted to f/u--- 3 week f/u appt schedule

## 2016-12-01 ENCOUNTER — Other Ambulatory Visit: Payer: Self-pay | Admitting: Internal Medicine

## 2016-12-01 DIAGNOSIS — F411 Generalized anxiety disorder: Secondary | ICD-10-CM

## 2016-12-01 NOTE — Telephone Encounter (Signed)
Last filled 05/10/16 at last OV CPE--- please advise if okay to refill

## 2016-12-01 NOTE — Telephone Encounter (Signed)
Rx called in to pharmacy. 

## 2016-12-01 NOTE — Telephone Encounter (Signed)
Ok to phone in Xanax 

## 2016-12-05 ENCOUNTER — Other Ambulatory Visit: Payer: Self-pay | Admitting: Internal Medicine

## 2016-12-05 NOTE — Telephone Encounter (Signed)
Ok to phone in Tramadol 

## 2016-12-05 NOTE — Telephone Encounter (Signed)
Last filled 04/2016... Please advise

## 2016-12-06 NOTE — Telephone Encounter (Signed)
Rx called in to pharmacy. 

## 2016-12-25 ENCOUNTER — Encounter: Payer: Self-pay | Admitting: Internal Medicine

## 2016-12-25 ENCOUNTER — Ambulatory Visit (INDEPENDENT_AMBULATORY_CARE_PROVIDER_SITE_OTHER): Payer: BLUE CROSS/BLUE SHIELD | Admitting: Internal Medicine

## 2016-12-25 VITALS — BP 128/80 | HR 73 | Temp 98.3°F | Wt 131.8 lb

## 2016-12-25 DIAGNOSIS — R413 Other amnesia: Secondary | ICD-10-CM | POA: Diagnosis not present

## 2016-12-25 DIAGNOSIS — I10 Essential (primary) hypertension: Secondary | ICD-10-CM | POA: Diagnosis not present

## 2016-12-25 DIAGNOSIS — R41840 Attention and concentration deficit: Secondary | ICD-10-CM

## 2016-12-25 NOTE — Progress Notes (Signed)
Subjective:    Patient ID: [name] [name], female    DOB: 04-02-1951, 66 y.o.   MRN: [id number]  HPI  Pt presents to the clinic today for 3 week follow up of HTN. Her BP medication was changed from Valsartan HCT to Losartan HCT. She has been taking the medication as prescribed and denies adverse side effects. Her BP today is 128/80.   She also has concerns about inattention, difficulty focusing, difficulty completing tasks and remembering where she put things. She reports she has always known that she has had ADD but she has never sought treatment for it. She reports her boss recently called her into the office to discuss this. She is requesting medication to help her focus today.  Review of Systems  Past Medical History:  Diagnosis Date  . Allergy   . Anxiety   . Hypertension     Current Outpatient Prescriptions  Medication Sig Dispense Refill  . ALPRAZolam (XANAX) 0.25 MG tablet TAKE 1/2 TABLET BY MOUTH EVERY NIGHT AT BEDTIME. 30 tablet 0  . aspirin 81 MG tablet Take 81 mg by mouth daily.    Marland Kitchen losartan-hydrochlorothiazide (HYZAAR) 100-25 MG tablet Take 1 tablet by mouth daily. 30 tablet 0  . Red Yeast Rice Extract (RED YEAST RICE PO) Take 2 capsules by mouth daily.     . rosuvastatin (CRESTOR) 10 MG tablet TAKE 1 TABLET BY MOUTH DAILY 90 tablet 1  . traMADol (ULTRAM) 50 MG tablet TAKE 1 TABLET BY MOUTH EVERY 8 HOURS AS NEEDED FOR PAIN. 30 tablet 0  . valsartan-hydrochlorothiazide (DIOVAN-HCT) 160-25 MG tablet TAKE 1 TABLET BY MOUTH DAILY 90 tablet 1   No current facility-administered medications for this visit.     Allergies  Allergen Reactions  . Erythromycin     REACTION: GI upset    Family History  Problem Relation Age of Onset  . Diabetes Mother   . Stroke Mother   . Other Mother        colon sugery  . Polymyalgia rheumatica Mother   . Heart disease Father   . Diabetes Father   . Colon cancer Neg Hx   . Cancer Neg Hx     Social History   Social History  .  Marital status: Married    Spouse name: N/A  . Number of children: 2  . Years of education: N/A   Occupational History  . self employed Other   Social History Main Topics  . Smoking status: Current Every Day Smoker    Packs/day: 0.75    Types: Cigarettes  . Smokeless tobacco: Never Used  . Alcohol use 6.0 oz/week    10 Cans of beer per week  . Drug use: No  . Sexual activity: Not on file   Other Topics Concern  . Not on file   Social History Narrative  . No narrative on file     Constitutional: Denies fever, malaise, fatigue, headache or abrupt weight changes.  Respiratory: Denies difficulty breathing, shortness of breath, cough or sputum production.   Cardiovascular: Denies chest pain, chest tightness, palpitations or swelling in the hands or feet.  Neurological: Pt reports inattention, difficulty focusing, trouble remembering. Denies dizziness, difficulty with speech or problems with balance and coordination.  Psych: Pt has a history of anxiety. Denies depression, SI/HI.  No other specific complaints in a complete review of systems (except as listed in HPI above).     Objective:   Physical Exam  BP 128/80   Pulse 73  Temp 98.3 F (36.8 C) (Oral)   Wt 131 lb 12 oz (59.8 kg)   SpO2 98%   BMI 25.73 kg/m  Wt Readings from Last 3 Encounters:  12/25/16 131 lb 12 oz (59.8 kg)  05/10/16 135 lb 8 oz (61.5 kg)  01/08/15 143 lb (64.9 kg)    General: Appears her stated age, well developed, well nourished in NAD. Cardiovascular: Normal rate and rhythm.  Pulmonary/Chest: Normal effort and positive vesicular breath sounds. No respiratory distress. No wheezes, rales or ronchi noted.  Neurological: Alert and oriented.   Psychiatric: Mood and affect normal. Behavior is normal. Judgment and thought content normal.     BMET    Component Value Date/Time   NA 137 05/10/2016 1620   K 3.8 05/10/2016 1620   CL 99 05/10/2016 1620   CO2 31 05/10/2016 1620   GLUCOSE 87  05/10/2016 1620   BUN 12 05/10/2016 1620   CREATININE 0.70 05/10/2016 1620   CALCIUM 9.6 05/10/2016 1620    Lipid Panel     Component Value Date/Time   CHOL 267 (H) 05/10/2016 1620   TRIG 109.0 05/10/2016 1620   HDL 50.00 05/10/2016 1620   CHOLHDL 5 05/10/2016 1620   VLDL 21.8 05/10/2016 1620   LDLCALC 195 (H) 05/10/2016 1620    CBC    Component Value Date/Time   WBC 7.6 05/10/2016 1620   RBC 4.12 05/10/2016 1620   HGB 13.8 05/10/2016 1620   HCT 40.0 05/10/2016 1620   PLT 225.0 05/10/2016 1620   MCV 97.1 05/10/2016 1620   MCHC 34.4 05/10/2016 1620   RDW 13.6 05/10/2016 1620    Hgb A1C Lab Results  Component Value Date   HGBA1C 5.7 04/13/2015            Assessment & Plan:   Inattention, Problems Focusing, Difficulty with Memory:  Does not seem c/w MCI Referral placed to Dr. Lurline Hare for ADD testing  RTC  In 5 months for your Welcome to Medicare Exam Webb Silversmith, NP

## 2016-12-25 NOTE — Patient Instructions (Signed)

## 2017-01-01 ENCOUNTER — Other Ambulatory Visit: Payer: Self-pay | Admitting: Internal Medicine

## 2017-02-02 ENCOUNTER — Other Ambulatory Visit: Payer: Self-pay

## 2017-02-02 MED ORDER — LOSARTAN POTASSIUM-HCTZ 100-25 MG PO TABS: 1.0000 | ORAL_TABLET | Freq: Every day | ORAL | 1 refills | Status: DC

## 2017-02-02 NOTE — Telephone Encounter (Signed)
Pt seen 12/25/16 and BP doing well on losartan HCTZ. Pt request # 90 to [facility]. Advised pt done. Pt said BP has been OK and pt does not feel any different since changed med which is a good thing. Pt will call to schedule f/u appt first of yr.

## 2017-02-26 ENCOUNTER — Telehealth: Payer: Self-pay | Admitting: Internal Medicine

## 2017-02-26 NOTE — Telephone Encounter (Signed)
Spoke with patient.  She continues taking benadryl and applying topical creams.  Poison ivy/oak is spreading and does not want to go to urgent care.  She will see Avie Echevaria, NP tomorrow (02/27/17 at 1015am).

## 2017-02-26 NOTE — Telephone Encounter (Signed)
Patient Name: [name] [name]  DOB: [birth year]    Initial Comment Caller states has poison ivy or poison oak; in private places now; [place] NOW    Nurse Assessment  Nurse: Joline Salt, RN, Malachy Mood Date/Time Eilene Ghazi Time): 02/26/2017 1:21:10 PM  Confirm and document reason for call. If symptomatic, describe symptoms. ---Caller states has poison ivy or poison oak in private places now. This has been there for several days, on shoulders, face, chest, and arms. She is taking benadryl.  Does the patient have any new or worsening symptoms? ---Yes  Will a triage be completed? ---Yes  Related visit to physician within the last 2 weeks? ---N/A  Does the PT have any chronic conditions? (i.e. diabetes, asthma, etc.) ---Yes  List chronic conditions. ---hypertension  Is this a behavioral health or substance abuse call? ---No     Guidelines    Guideline Title Affirmed Question Affirmed Notes  Poison Ivy - [place] [1] Severe poison ivy, oak, or sumac reaction in the past AND [2] face involved    Final Disposition User   See Physician within 4 Hours (or PCP triage) Joline Salt, RN, Malachy Mood    Comments  Caller cannot get appt with [place] and does not wish to go to urgent care, would rather wait and be seen at PCP office.  Caller wants to try to be worked in to the office, she does not want to go to [facility].   Referrals  GO TO FACILITY UNDECIDED   Caller Disagree/Comply Disagree  Caller Understands Yes  PreDisposition Did not know what to do

## 2017-02-27 ENCOUNTER — Ambulatory Visit (INDEPENDENT_AMBULATORY_CARE_PROVIDER_SITE_OTHER): Payer: BLUE CROSS/BLUE SHIELD | Admitting: Internal Medicine

## 2017-02-27 ENCOUNTER — Encounter: Payer: Self-pay | Admitting: Internal Medicine

## 2017-02-27 VITALS — BP 128/80 | HR 80 | Temp 98.4°F | Wt 134.0 lb

## 2017-02-27 DIAGNOSIS — L255 Unspecified contact dermatitis due to plants, except food: Secondary | ICD-10-CM | POA: Diagnosis not present

## 2017-02-27 MED ORDER — PREDNISONE 10 MG PO TABS: ORAL_TABLET | ORAL | 0 refills | Status: DC

## 2017-02-27 MED ORDER — TRAMADOL HCL 50 MG PO TABS: 50.0000 mg | ORAL_TABLET | Freq: Three times a day (TID) | ORAL | 0 refills | Status: DC | PRN

## 2017-02-27 NOTE — Patient Instructions (Signed)
Poison Ivy Dermatitis Poison ivy dermatitis is redness and soreness (inflammation) of the skin. It is caused by a chemical that is found on the leaves of the poison ivy plant. You may also have itching, a rash, and blisters. Symptoms often clear up in 1-2 weeks. You may get this condition by touching a poison ivy plant. You can also get it by touching something that has the chemical on it. This may include animals or objects that have come in contact with the plant. Follow these instructions at home: General instructions  Take or apply over-the-counter and prescription medicines only as told by your doctor.  If you touch poison ivy, wash your skin with soap and cold water right away.  Use hydrocortisone creams or calamine lotion as needed to help with itching.  Take oatmeal baths as needed. Use colloidal oatmeal. You can get this at a pharmacy or grocery store. Follow the instructions on the package.  Do not scratch or rub your skin.  While you have the rash, wash your clothes right after you wear them. Prevention  Know what poison ivy looks like so you can avoid it. This plant has three leaves with flowering branches on a single stem. The leaves are glossy. They have uneven edges that come to a point at the front.  If you have touched poison ivy, wash with soap and water right away. Be sure to wash under your fingernails.  When hiking or camping, wear long pants, a long-sleeved shirt, tall socks, and hiking boots. You can also use a lotion on your skin that helps to prevent contact with the chemical on the plant.  If you think that your clothes or outdoor gear came in contact with poison ivy, rinse them off with a garden hose before you bring them inside your house. Contact a doctor if:  You have open sores in the rash area.  You have more redness, swelling, or pain in the affected area.  You have redness that spreads beyond the rash area.  You have fluid, blood, or pus coming from  the affected area.  You have a fever.  You have a rash over a large area of your body.  You have a rash on your eyes, mouth, or genitals.  Your rash does not get better after a few days. Get help right away if:  Your face swells or your eyes swell shut.  You have trouble breathing.  You have trouble swallowing. This information is not intended to replace advice given to you by your health care provider. Make sure you discuss any questions you have with your health care provider. Document Released: 06/03/2010 Document Revised: 10/07/2015 Document Reviewed: 10/07/2014 Elsevier Interactive Patient Education  2018 Elsevier Inc.  

## 2017-02-27 NOTE — Progress Notes (Signed)
Subjective:    Patient ID: [name] [name], female    DOB: 12/16/1950, 66 y.o.   MRN: [id number]  HPI  Pt presents to the clinic today with c/o a rash. She reports this started 3-4 days ago. It started out on her face and upper arms. She reports it has now spread to her groin area. The rash is very itchy. She has been working out in her back yard, and has come in contact with poison ivy. She has tried Benadryl, Chartered loss adjuster and Hydrocortisone cream with minimal relief.   Review of Systems      Past Medical History:  Diagnosis Date  . Allergy   . Anxiety   . Hypertension     Current Outpatient Prescriptions  Medication Sig Dispense Refill  . ALPRAZolam (XANAX) 0.25 MG tablet TAKE 1/2 TABLET BY MOUTH EVERY NIGHT AT BEDTIME. 30 tablet 0  . aspirin 81 MG tablet Take 81 mg by mouth daily.    Marland Kitchen losartan-hydrochlorothiazide (HYZAAR) 100-25 MG tablet Take 1 tablet by mouth daily. 90 tablet 1  . Red Yeast Rice Extract (RED YEAST RICE PO) Take 2 capsules by mouth daily.     . rosuvastatin (CRESTOR) 10 MG tablet TAKE 1 TABLET BY MOUTH DAILY 90 tablet 1  . traMADol (ULTRAM) 50 MG tablet TAKE 1 TABLET BY MOUTH EVERY 8 HOURS AS NEEDED FOR PAIN. 30 tablet 0   No current facility-administered medications for this visit.     Allergies  Allergen Reactions  . Erythromycin     REACTION: GI upset    Family History  Problem Relation Age of Onset  . Diabetes Mother   . Stroke Mother   . Other Mother        colon sugery  . Polymyalgia rheumatica Mother   . Heart disease Father   . Diabetes Father   . Colon cancer Neg Hx   . Cancer Neg Hx     Social History   Social History  . Marital status: Married    Spouse name: N/A  . Number of children: 2  . Years of education: N/A   Occupational History  . self employed Other   Social History Main Topics  . Smoking status: Current Every Day Smoker    Packs/day: 0.75    Types: Cigarettes  . Smokeless tobacco: Never Used  . Alcohol use  6.0 oz/week    10 Cans of beer per week  . Drug use: No  . Sexual activity: Not on file   Other Topics Concern  . Not on file   Social History Narrative  . No narrative on file     Constitutional: Denies fever, malaise, fatigue, headache or abrupt weight changes.  Skin: Pt reports rash. Denies ulcercations.    No other specific complaints in a complete review of systems (except as listed in HPI above).  Objective:   Physical Exam   BP 128/80   Pulse 80   Temp 98.4 F (36.9 C) (Oral)   Wt 134 lb (60.8 kg)   SpO2 98%   BMI 26.17 kg/m  Wt Readings from Last 3 Encounters:  02/27/17 134 lb (60.8 kg)  12/25/16 131 lb 12 oz (59.8 kg)  05/10/16 135 lb 8 oz (61.5 kg)    General: Appears her stated age, in NAD. Skin: Scattered vesicular lesions on an erythematous base noted on forehead, cheeks, upper arms and right groin.   BMET    Component Value Date/Time   NA 137 05/10/2016 1620  K 3.8 05/10/2016 1620   CL 99 05/10/2016 1620   CO2 31 05/10/2016 1620   GLUCOSE 87 05/10/2016 1620   BUN 12 05/10/2016 1620   CREATININE 0.70 05/10/2016 1620   CALCIUM 9.6 05/10/2016 1620    Lipid Panel     Component Value Date/Time   CHOL 267 (H) 05/10/2016 1620   TRIG 109.0 05/10/2016 1620   HDL 50.00 05/10/2016 1620   CHOLHDL 5 05/10/2016 1620   VLDL 21.8 05/10/2016 1620   LDLCALC 195 (H) 05/10/2016 1620    CBC    Component Value Date/Time   WBC 7.6 05/10/2016 1620   RBC 4.12 05/10/2016 1620   HGB 13.8 05/10/2016 1620   HCT 40.0 05/10/2016 1620   PLT 225.0 05/10/2016 1620   MCV 97.1 05/10/2016 1620   MCHC 34.4 05/10/2016 1620   RDW 13.6 05/10/2016 1620    Hgb A1C Lab Results  Component Value Date   HGBA1C 5.7 04/13/2015           Assessment & Plan:   Contact Dermatitis due to Plant [facility]):  Discussed scrubbing with warm water and soap to get the oil off eRx for Pred Taper x 6 days Ok to continue Benadryl or Hydrocortisone cream OTC Calamine  lotion may also be helpful  Return precautions discussed Webb Silversmith, NP

## 2017-03-22 ENCOUNTER — Other Ambulatory Visit: Payer: Self-pay | Admitting: Internal Medicine

## 2017-04-27 ENCOUNTER — Other Ambulatory Visit: Payer: Self-pay | Admitting: Internal Medicine

## 2017-04-27 NOTE — Telephone Encounter (Signed)
Last filled 02/27/17... Please advise

## 2017-04-27 NOTE — Telephone Encounter (Signed)
Ok to phone in Tramadol 

## 2017-04-27 NOTE — Telephone Encounter (Signed)
Rx called in to pharmacy. 

## 2017-05-22 ENCOUNTER — Encounter: Payer: Self-pay | Admitting: Internal Medicine

## 2017-05-22 ENCOUNTER — Ambulatory Visit: Payer: Self-pay | Admitting: Internal Medicine

## 2017-05-22 VITALS — BP 132/80 | HR 72 | Temp 98.0°F | Wt 134.0 lb

## 2017-05-22 DIAGNOSIS — J069 Acute upper respiratory infection, unspecified: Secondary | ICD-10-CM

## 2017-05-22 MED ORDER — AZITHROMYCIN 250 MG PO TABS
ORAL_TABLET | ORAL | 0 refills | Status: DC
Start: 2017-05-22 — End: 2017-08-02

## 2017-05-22 MED ORDER — HYDROCOD POLST-CPM POLST ER 10-8 MG/5ML PO SUER: 5.0000 mL | Freq: Every evening | ORAL | 0 refills | Status: DC | PRN

## 2017-05-22 NOTE — Patient Instructions (Signed)
Upper Respiratory Infection, Adult Most upper respiratory infections (URIs) are caused by a virus. A URI affects the nose, throat, and upper air passages. The most common type of URI is often called "the common cold." Follow these instructions at home:  Take medicines only as told by your doctor.  Gargle warm saltwater or take cough drops to comfort your throat as told by your doctor.  Use a warm mist humidifier or inhale steam from a shower to increase air moisture. This may make it easier to breathe.  Drink enough fluid to keep your pee (urine) clear or pale yellow.  Eat soups and other clear broths.  Have a healthy diet.  Rest as needed.  Go back to work when your fever is gone or your doctor says it is okay. ? You may need to stay home longer to avoid giving your URI to others. ? You can also wear a face mask and wash your hands often to prevent spread of the virus.  Use your inhaler more if you have asthma.  Do not use any tobacco products, including cigarettes, chewing tobacco, or electronic cigarettes. If you need help quitting, ask your doctor. Contact a doctor if:  You are getting worse, not better.  Your symptoms are not helped by medicine.  You have chills.  You are getting more short of breath.  You have brown or red mucus.  You have yellow or brown discharge from your nose.  You have pain in your face, especially when you bend forward.  You have a fever.  You have puffy (swollen) neck glands.  You have pain while swallowing.  You have white areas in the back of your throat. Get help right away if:  You have very bad or constant: ? Headache. ? Ear pain. ? Pain in your forehead, behind your eyes, and over your cheekbones (sinus pain). ? Chest pain.  You have long-lasting (chronic) lung disease and any of the following: ? Wheezing. ? Long-lasting cough. ? Coughing up blood. ? A change in your usual mucus.  You have a stiff neck.  You have  changes in your: ? Vision. ? Hearing. ? Thinking. ? Mood. This information is not intended to replace advice given to you by your health care provider. Make sure you discuss any questions you have with your health care provider. Document Released: 10/18/2007 Document Revised: 01/02/2016 Document Reviewed: 08/06/2013 Elsevier Interactive Patient Education  2018 Elsevier Inc.  

## 2017-05-22 NOTE — Progress Notes (Signed)
HPI  Pt presents to the clinic today with c/o watery eyes, ear pain and cough. This started 10 days ago. She describes the ear pain as fullness. The drainage from her eyes is watery. The cough is productive of yellow/green mucous. She denies fever, chills or body aches. She has tried Robitussin DM, Dayquil and Mucinex with minimal relief. She has a history of allergies. She has had sick contacts with similar symptoms.  Review of Systems      Past Medical History:  Diagnosis Date  . Allergy   . Anxiety   . Hypertension     Family History  Problem Relation Age of Onset  . Diabetes Mother   . Stroke Mother   . Other Mother        colon sugery  . Polymyalgia rheumatica Mother   . Heart disease Father   . Diabetes Father   . Colon cancer Neg Hx   . Cancer Neg Hx     Social History   Socioeconomic History  . Marital status: Married    Spouse name: Not on file  . Number of children: 2  . Years of education: Not on file  . Highest education level: Not on file  Social Needs  . Financial resource strain: Not on file  . Food insecurity - worry: Not on file  . Food insecurity - inability: Not on file  . Transportation needs - medical: Not on file  . Transportation needs - non-medical: Not on file  Occupational History  . Occupation: self employed    Employer: OTHER  Tobacco Use  . Smoking status: Current Every Day Smoker    Packs/day: 0.75    Types: Cigarettes  . Smokeless tobacco: Never Used  Substance and Sexual Activity  . Alcohol use: Yes    Alcohol/week: 6.0 oz    Types: 10 Cans of beer per week  . Drug use: No  . Sexual activity: Not on file  Other Topics Concern  . Not on file  Social History Narrative  . Not on file    Allergies  Allergen Reactions  . Erythromycin     REACTION: GI upset     Constitutional: Denies headache, fatigue, fever or headache abrupt weight changes.  HEENT:  Positive ear pain, watery eyes and cough. Denies eye redness, eye pain,  pressure behind the eyes, facial pain, nasal congestion, ringing in the ears, wax buildup, runny nose or bloody nose. Respiratory: Positive cough. Denies difficulty breathing or shortness of breath.  Cardiovascular: Denies chest pain, chest tightness, palpitations or swelling in the hands or feet.   No other specific complaints in a complete review of systems (except as listed in HPI above).  Objective:   BP 132/80   Pulse 72   Temp 98 F (36.7 C) (Oral)   Wt 134 lb (60.8 kg)   SpO2 97%   BMI 26.17 kg/m   Wt Readings from Last 3 Encounters:  05/22/17 134 lb (60.8 kg)  02/27/17 134 lb (60.8 kg)  12/25/16 131 lb 12 oz (59.8 kg)     General: Appears her stated age, in NAD. HEENT: Head: normal shape and size, no sinus tenderness noted; Eyes: sclera mildly injected, no icterus, conjunctiva pink, watery drainage noted; Ears: Tm's pink and retracted, normal light reflex; Nose: mucosa pink and moist, septum midline; Throat/Mouth: + PND. Teeth present, mucosa erythematous and moist, no exudate noted, no lesions or ulcerations noted.  Neck: No cervical lymphadenopathy.  Pulmonary/Chest: Normal effort and positive vesicular breath  sounds. No respiratory distress. No wheezes, rales or ronchi noted.       Assessment & Plan:   Upper Respiratory Infection:  Get some rest and drink plenty of water eRx for Azithromax x 5 days eRx for Tussionex cough syrup  RTC as needed or if symptoms persist.   Webb Silversmith, NP

## 2017-06-19 ENCOUNTER — Other Ambulatory Visit: Payer: Self-pay | Admitting: Internal Medicine

## 2017-07-31 ENCOUNTER — Other Ambulatory Visit: Payer: Self-pay | Admitting: Internal Medicine

## 2017-08-02 ENCOUNTER — Ambulatory Visit (INDEPENDENT_AMBULATORY_CARE_PROVIDER_SITE_OTHER): Payer: Medicare Other | Admitting: Internal Medicine

## 2017-08-02 ENCOUNTER — Encounter: Payer: Self-pay | Admitting: Internal Medicine

## 2017-08-02 VITALS — BP 126/80 | HR 78 | Temp 98.2°F | Wt 134.0 lb

## 2017-08-02 DIAGNOSIS — J209 Acute bronchitis, unspecified: Secondary | ICD-10-CM

## 2017-08-02 MED ORDER — AMOXICILLIN 875 MG PO TABS: 875.0000 mg | ORAL_TABLET | Freq: Two times a day (BID) | ORAL | 0 refills | Status: DC

## 2017-08-02 MED ORDER — PREDNISONE 10 MG PO TABS: ORAL_TABLET | ORAL | 0 refills | Status: DC

## 2017-08-02 MED ORDER — LOSARTAN POTASSIUM-HCTZ 100-25 MG PO TABS: 1.0000 | ORAL_TABLET | Freq: Every day | ORAL | 1 refills | Status: DC

## 2017-08-02 NOTE — Patient Instructions (Signed)

## 2017-08-02 NOTE — Progress Notes (Signed)
HPI  Pt presents to the clinic today with c/o headache, facial pressure, nasal congestion, sore throat and cough. This started 2 weeks ago. She describes the headache as pressure. She denies facial pain or ear pain. She is blowing clear mucous out of her nose. She is having some difficulty swallowing. The cough is productive of yellow/green mucous. She denies fever, chills or body aches. She has tried Claritin, Claritin D, Dayquil, Nyquil, Mucinex and Flonase with some relief. She has a history of allergies. She has had sick contacts.  Review of Systems     Past Medical History:  Diagnosis Date  . Allergy   . Anxiety   . Hypertension     Family History  Problem Relation Age of Onset  . Diabetes Mother   . Stroke Mother   . Other Mother        colon sugery  . Polymyalgia rheumatica Mother   . Heart disease Father   . Diabetes Father   . Colon cancer Neg Hx   . Cancer Neg Hx     Social History   Socioeconomic History  . Marital status: Married    Spouse name: Not on file  . Number of children: 2  . Years of education: Not on file  . Highest education level: Not on file  Occupational History  . Occupation: self employed    Fish farm manager: [employer]  Social Needs  . Financial resource strain: Not on file  . Food insecurity:    Worry: Not on file    Inability: Not on file  . Transportation needs:    Medical: Not on file    Non-medical: Not on file  Tobacco Use  . Smoking status: Current Every Day Smoker    Packs/day: 0.75    Types: Cigarettes  . Smokeless tobacco: Never Used  Substance and Sexual Activity  . Alcohol use: Yes    Alcohol/week: 6.0 oz    Types: 10 Cans of beer per week  . Drug use: No  . Sexual activity: Not on file  Lifestyle  . Physical activity:    Days per week: Not on file    Minutes per session: Not on file  . Stress: Not on file  Relationships  . Social connections:    Talks on phone: Not on file    Gets together: Not on file    Attends  religious service: Not on file    Active member of club or organization: Not on file    Attends meetings of clubs or organizations: Not on file    Relationship status: Not on file  . Intimate partner violence:    Fear of current or ex partner: Not on file    Emotionally abused: Not on file    Physically abused: Not on file    Forced sexual activity: Not on file  Other Topics Concern  . Not on file  Social History Narrative  . Not on file    Allergies  Allergen Reactions  . Erythromycin     REACTION: GI upset     Constitutional: Positive headache. Denies fatigue, fever or abrupt weight changes.  HEENT:  Positive facial pressure, nasal congestion and sore throat. Denies eye redness, ear pain, ringing in the ears, wax buildup, runny nose or bloody nose. Respiratory: Positive cough. Denies difficulty breathing or shortness of breath.  Cardiovascular: Denies chest pain, chest tightness, palpitations or swelling in the hands or feet.   No other specific complaints in a complete review of systems (  except as listed in HPI above).  Objective:   BP 126/80   Pulse 78   Temp 98.2 F (36.8 C) (Oral)   Wt 134 lb (60.8 kg)   SpO2 98%   BMI 26.17 kg/m   General: Appears her stated age, in NAD. HEENT: Head: normal shape and size, no sinus tenderness noted; Ears: Tm's gray and intact, normal light reflex; Nose: mucosa boggy and moist, septum midline; Throat/Mouth: + PND. Teeth present, mucosa erythematous and moist, no exudate noted, no lesions or ulcerations noted.  Neck:  No adenopathy noted.  Pulmonary/Chest: Normal effort and coarse rhonchi throughout. No respiratory distress. No wheezes, rales noted.       Assessment & Plan:   Acute Bronchitis:  Continue Loratadine and Flonase- stop all the other OTC meds eRx for Amoxil 875 mg BID x 7 days eRx for Pred Taper x 6 days   RTC as needed or if symptoms persist. Webb Silversmith, NP

## 2017-08-17 ENCOUNTER — Other Ambulatory Visit: Payer: Self-pay | Admitting: Internal Medicine

## 2017-08-17 DIAGNOSIS — F411 Generalized anxiety disorder: Secondary | ICD-10-CM

## 2017-08-17 NOTE — Telephone Encounter (Signed)
Copied from [place] [id number]. Topic: Quick Communication - Rx Refill/Question >> Aug 17, 2017  2:15 PM Bea Graff, NT wrote: Medication: ALPRAZolam Duanne Moron) 0.25 MG tablet  Has the patient contacted their pharmacy? Yes.   (Agent: If no, request that the patient contact the pharmacy for the refill.) Preferred Pharmacy (with phone number or street name): [facility] [place]: Please be advised that RX refills may take up to 3 business days. We ask that you follow-up with your pharmacy.

## 2017-08-17 NOTE — Telephone Encounter (Signed)
LOV  08/02/17 Webb Silversmith [facility] [place]

## 2017-08-17 NOTE — Telephone Encounter (Signed)
Hasn't had annual exam since 2017. Needs CPE scheduled.

## 2017-08-20 MED ORDER — ALPRAZOLAM 0.25 MG PO TABS: ORAL_TABLET | ORAL | 0 refills | Status: DC

## 2017-08-20 NOTE — Telephone Encounter (Signed)
Pt already has CPE scheduled for June... Please advise

## 2017-08-20 NOTE — Addendum Note (Signed)
Addended by: Jearld Fenton on: 08/20/2017 09:52 AM   Modules accepted: Orders

## 2017-10-16 ENCOUNTER — Ambulatory Visit (INDEPENDENT_AMBULATORY_CARE_PROVIDER_SITE_OTHER): Payer: Medicare Other | Admitting: Internal Medicine

## 2017-10-16 ENCOUNTER — Encounter: Payer: Self-pay | Admitting: Internal Medicine

## 2017-10-16 VITALS — BP 128/76 | HR 65 | Temp 97.6°F | Ht 60.0 in | Wt 131.0 lb

## 2017-10-16 DIAGNOSIS — E559 Vitamin D deficiency, unspecified: Secondary | ICD-10-CM | POA: Diagnosis not present

## 2017-10-16 DIAGNOSIS — I10 Essential (primary) hypertension: Secondary | ICD-10-CM | POA: Diagnosis not present

## 2017-10-16 DIAGNOSIS — E7800 Pure hypercholesterolemia, unspecified: Secondary | ICD-10-CM

## 2017-10-16 DIAGNOSIS — M47818 Spondylosis without myelopathy or radiculopathy, sacral and sacrococcygeal region: Secondary | ICD-10-CM | POA: Diagnosis not present

## 2017-10-16 DIAGNOSIS — Z0000 Encounter for general adult medical examination without abnormal findings: Secondary | ICD-10-CM

## 2017-10-16 DIAGNOSIS — Z23 Encounter for immunization: Secondary | ICD-10-CM

## 2017-10-16 DIAGNOSIS — F419 Anxiety disorder, unspecified: Secondary | ICD-10-CM | POA: Diagnosis not present

## 2017-10-16 DIAGNOSIS — F411 Generalized anxiety disorder: Secondary | ICD-10-CM | POA: Diagnosis not present

## 2017-10-16 HISTORY — DX: Spondylosis without myelopathy or radiculopathy, sacral and sacrococcygeal region: M47.818

## 2017-10-16 LAB — CBC
HEMATOCRIT: 42.1 % (ref 36.0–46.0)
Hemoglobin: 14.5 g/dL (ref 12.0–15.0)
MCHC: 34.5 g/dL (ref 30.0–36.0)
MCV: 97.8 fl (ref 78.0–100.0)
PLATELETS: 257 10*3/uL (ref 150.0–400.0)
RBC: 4.31 Mil/uL (ref 3.87–5.11)
RDW: 13.6 % (ref 11.5–15.5)
WBC: 8.5 10*3/uL (ref 4.0–10.5)

## 2017-10-16 LAB — COMPREHENSIVE METABOLIC PANEL
ALBUMIN: 4.7 g/dL (ref 3.5–5.2)
ALK PHOS: 54 U/L (ref 39–117)
ALT: 18 U/L (ref 0–35)
AST: 21 U/L (ref 0–37)
BILIRUBIN TOTAL: 0.7 mg/dL (ref 0.2–1.2)
BUN: 14 mg/dL (ref 6–23)
CO2: 27 mEq/L (ref 19–32)
Calcium: 10 mg/dL (ref 8.4–10.5)
Chloride: 99 mEq/L (ref 96–112)
Creatinine, Ser: 0.64 mg/dL (ref 0.40–1.20)
GFR: 98.9 mL/min (ref >60.00)
GLUCOSE: 103 mg/dL — AB (ref 70–99)
Potassium: 3.8 mEq/L (ref 3.5–5.1)
Sodium: 137 mEq/L (ref 135–145)
TOTAL PROTEIN: 7.4 g/dL (ref 6.0–8.3)

## 2017-10-16 LAB — LIPID PANEL
CHOLESTEROL: 189 mg/dL (ref 0–200)
HDL: 46.6 mg/dL (ref >39.00)
LDL Cholesterol: 125 mg/dL — ABNORMAL HIGH (ref 0–99)
NONHDL: 142.38
Total CHOL/HDL Ratio: 4
Triglycerides: 87 mg/dL (ref 0.0–149.0)
VLDL: 17.4 mg/dL (ref 0.0–40.0)

## 2017-10-16 LAB — VITAMIN D 25 HYDROXY (VIT D DEFICIENCY, FRACTURES): VITD: 31.84 ng/mL (ref 30.00–100.00)

## 2017-10-16 MED ORDER — TRAMADOL HCL 50 MG PO TABS: 50.0000 mg | ORAL_TABLET | Freq: Three times a day (TID) | ORAL | 0 refills | Status: DC | PRN

## 2017-10-16 NOTE — Assessment & Plan Note (Signed)
Controlled on Losartan HCT CBC and CMET today Reinforced DASH diet

## 2017-10-16 NOTE — Patient Instructions (Signed)
Health Maintenance for Postmenopausal Women Menopause is a normal process in which your reproductive ability comes to an end. This process happens gradually over a span of months to years, usually between the ages of 22 and 9. Menopause is complete when you have missed 12 consecutive menstrual periods. It is important to talk with your health care provider about some of the most common conditions that affect postmenopausal women, such as heart disease, cancer, and bone loss (osteoporosis). Adopting a healthy lifestyle and getting preventive care can help to promote your health and wellness. Those actions can also lower your chances of developing some of these common conditions. What should I know about menopause? During menopause, you may experience a number of symptoms, such as:  Moderate-to-severe hot flashes.  Night sweats.  Decrease in sex drive.  Mood swings.  Headaches.  Tiredness.  Irritability.  Memory problems.  Insomnia.  Choosing to treat or not to treat menopausal changes is an individual decision that you make with your health care provider. What should I know about hormone replacement therapy and supplements? Hormone therapy products are effective for treating symptoms that are associated with menopause, such as hot flashes and night sweats. Hormone replacement carries certain risks, especially as you become older. If you are thinking about using estrogen or estrogen with progestin treatments, discuss the benefits and risks with your health care provider. What should I know about heart disease and stroke? Heart disease, heart attack, and stroke become more likely as you age. This may be due, in part, to the hormonal changes that your body experiences during menopause. These can affect how your body processes dietary fats, triglycerides, and cholesterol. Heart attack and stroke are both medical emergencies. There are many things that you can do to help prevent heart disease  and stroke:  Have your blood pressure checked at least every 1-2 years. High blood pressure causes heart disease and increases the risk of stroke.  If you are 53-22 years old, ask your health care provider if you should take aspirin to prevent a heart attack or a stroke.  Do not use any tobacco products, including cigarettes, chewing tobacco, or electronic cigarettes. If you need help quitting, ask your health care provider.  It is important to eat a healthy diet and maintain a healthy weight. ? Be sure to include plenty of vegetables, fruits, low-fat dairy products, and lean protein. ? Avoid eating foods that are high in solid fats, added sugars, or salt (sodium).  Get regular exercise. This is one of the most important things that you can do for your health. ? Try to exercise for at least 150 minutes each week. The type of exercise that you do should increase your heart rate and make you sweat. This is known as moderate-intensity exercise. ? Try to do strengthening exercises at least twice each week. Do these in addition to the moderate-intensity exercise.  Know your numbers.Ask your health care provider to check your cholesterol and your blood glucose. Continue to have your blood tested as directed by your health care provider.  What should I know about cancer screening? There are several types of cancer. Take the following steps to reduce your risk and to catch any cancer development as early as possible. Breast Cancer  Practice breast self-awareness. ? This means understanding how your breasts normally appear and feel. ? It also means doing regular breast self-exams. Let your health care provider know about any changes, no matter how small.  If you are 40  or older, have a clinician do a breast exam (clinical breast exam or CBE) every year. Depending on your age, family history, and medical history, it may be recommended that you also have a yearly breast X-ray (mammogram).  If you  have a family history of breast cancer, talk with your health care provider about genetic screening.  If you are at high risk for breast cancer, talk with your health care provider about having an MRI and a mammogram every year.  Breast cancer (BRCA) gene test is recommended for women who have family members with BRCA-related cancers. Results of the assessment will determine the need for genetic counseling and BRCA1 and for BRCA2 testing. BRCA-related cancers include these types: ? Breast. This occurs in males or females. ? Ovarian. ? Tubal. This may also be called fallopian tube cancer. ? Cancer of the abdominal or pelvic lining (peritoneal cancer). ? Prostate. ? Pancreatic.  Cervical, Uterine, and Ovarian Cancer Your health care provider may recommend that you be screened regularly for cancer of the pelvic organs. These include your ovaries, uterus, and vagina. This screening involves a pelvic exam, which includes checking for microscopic changes to the surface of your cervix (Pap test).  For women ages 21-65, health care providers may recommend a pelvic exam and a Pap test every three years. For women ages 79-65, they may recommend the Pap test and pelvic exam, combined with testing for human papilloma virus (HPV), every five years. Some types of HPV increase your risk of cervical cancer. Testing for HPV may also be done on women of any age who have unclear Pap test results.  Other health care providers may not recommend any screening for nonpregnant women who are considered low risk for pelvic cancer and have no symptoms. Ask your health care provider if a screening pelvic exam is right for you.  If you have had past treatment for cervical cancer or a condition that could lead to cancer, you need Pap tests and screening for cancer for at least 20 years after your treatment. If Pap tests have been discontinued for you, your risk factors (such as having a new sexual partner) need to be  reassessed to determine if you should start having screenings again. Some women have medical problems that increase the chance of getting cervical cancer. In these cases, your health care provider may recommend that you have screening and Pap tests more often.  If you have a family history of uterine cancer or ovarian cancer, talk with your health care provider about genetic screening.  If you have vaginal bleeding after reaching menopause, tell your health care provider.  There are currently no reliable tests available to screen for ovarian cancer.  Lung Cancer Lung cancer screening is recommended for adults 69-62 years old who are at high risk for lung cancer because of a history of smoking. A yearly low-dose CT scan of the lungs is recommended if you:  Currently smoke.  Have a history of at least 30 pack-years of smoking and you currently smoke or have quit within the past 15 years. A pack-year is smoking an average of one pack of cigarettes per day for one year.  Yearly screening should:  Continue until it has been 15 years since you quit.  Stop if you develop a health problem that would prevent you from having lung cancer treatment.  Colorectal Cancer  This type of cancer can be detected and can often be prevented.  Routine colorectal cancer screening usually begins at  age 42 and continues through age 45.  If you have risk factors for colon cancer, your health care provider may recommend that you be screened at an earlier age.  If you have a family history of colorectal cancer, talk with your health care provider about genetic screening.  Your health care provider may also recommend using home test kits to check for hidden blood in your stool.  A small camera at the end of a tube can be used to examine your colon directly (sigmoidoscopy or colonoscopy). This is done to check for the earliest forms of colorectal cancer.  Direct examination of the colon should be repeated every  5-10 years until age 71. However, if early forms of precancerous polyps or small growths are found or if you have a family history or genetic risk for colorectal cancer, you may need to be screened more often.  Skin Cancer  Check your skin from head to toe regularly.  Monitor any moles. Be sure to tell your health care provider: ? About any new moles or changes in moles, especially if there is a change in a mole's shape or color. ? If you have a mole that is larger than the size of a pencil eraser.  If any of your family members has a history of skin cancer, especially at a young age, talk with your health care provider about genetic screening.  Always use sunscreen. Apply sunscreen liberally and repeatedly throughout the day.  Whenever you are outside, protect yourself by wearing long sleeves, pants, a wide-brimmed hat, and sunglasses.  What should I know about osteoporosis? Osteoporosis is a condition in which bone destruction happens more quickly than new bone creation. After menopause, you may be at an increased risk for osteoporosis. To help prevent osteoporosis or the bone fractures that can happen because of osteoporosis, the following is recommended:  If you are 46-71 years old, get at least 1,000 mg of calcium and at least 600 mg of vitamin D per day.  If you are older than age 55 but younger than age 65, get at least 1,200 mg of calcium and at least 600 mg of vitamin D per day.  If you are older than age 54, get at least 1,200 mg of calcium and at least 800 mg of vitamin D per day.  Smoking and excessive alcohol intake increase the risk of osteoporosis. Eat foods that are rich in calcium and vitamin D, and do weight-bearing exercises several times each week as directed by your health care provider. What should I know about how menopause affects my mental health? Depression may occur at any age, but it is more common as you become older. Common symptoms of depression  include:  Low or sad mood.  Changes in sleep patterns.  Changes in appetite or eating patterns.  Feeling an overall lack of motivation or enjoyment of activities that you previously enjoyed.  Frequent crying spells.  Talk with your health care provider if you think that you are experiencing depression. What should I know about immunizations? It is important that you get and maintain your immunizations. These include:  Tetanus, diphtheria, and pertussis (Tdap) booster vaccine.  Influenza every year before the flu season begins.  Pneumonia vaccine.  Shingles vaccine.  Your health care provider may also recommend other immunizations. This information is not intended to replace advice given to you by your health care provider. Make sure you discuss any questions you have with your health care provider. Document Released: 06/23/2005  Document Revised: 11/19/2015 Document Reviewed: 02/02/2015 Elsevier Interactive Patient Education  2018 [employer] Inc[employer]  

## 2017-10-16 NOTE — Assessment & Plan Note (Signed)
Controlled on prn Xanax Will monitor

## 2017-10-16 NOTE — Progress Notes (Signed)
HPI:  Pt presents to the clinic today for her Welcome to Medicare Exam. She is also due to follow up chronic conditions.  Anxiety: Triggered by her marriage to a difficult spouse. She takes Xanax on a rare occasion. She denies depression, SI/HI.  HTN: Her BP today is 128/76. She is taking Losartan HCT as prescribed. ECG from 10/2014 reviewed.  HLD: Her last LDL was 195, 04/2016. She is taking Rosuvastatin as prescribed. She denies myalgias. She does not consume a low fat diet.  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 She takes Tramadol as needed and would like a refill of that today.  Past Medical History:  Diagnosis Date  . Allergy   . Anxiety   . Hypertension     Current Outpatient Medications  Medication Sig Dispense Refill  . ALPRAZolam (XANAX) 0.25 MG tablet TAKE 1/2 TABLET BY MOUTH EVERY NIGHT AT BEDTIME. 15 tablet 0  . amoxicillin (AMOXIL) 875 MG tablet Take 1 tablet (875 mg total) by mouth 2 (two) times daily. 14 tablet 0  . losartan-hydrochlorothiazide (HYZAAR) 100-25 MG tablet TAKE 1 TABLET BY MOUTH DAILY 90 tablet 0  . losartan-hydrochlorothiazide (HYZAAR) 100-25 MG tablet Take 1 tablet by mouth daily. 90 tablet 1  . predniSONE (DELTASONE) 10 MG tablet Take 3 tabs on days 1-2, take 2 tabs on days 3-4, take 1 tab on days 5-6 12 tablet 0  . Red Yeast Rice Extract (RED YEAST RICE PO) Take 2 capsules by mouth daily.     . rosuvastatin (CRESTOR) 10 MG tablet Take 1 tablet (10 mg total) by mouth daily. 90 tablet 0  . traMADol (ULTRAM) 50 MG tablet TAKE 1 TABLET BY MOUTH EVERY 8 HOURS AS NEEDED FOR PAIN. 30 tablet 0   No current facility-administered medications for this visit.     Allergies  Allergen Reactions  . Erythromycin     REACTION: GI upset    Family History  Problem Relation Age of Onset  . Diabetes Mother   . Stroke Mother   . Other Mother        colon sugery  . Polymyalgia rheumatica Mother   . Heart disease Father   . Diabetes Father    . Colon cancer Neg Hx   . Cancer Neg Hx     Social History   Socioeconomic History  . Marital status: Married    Spouse name: Not on file  . Number of children: 2  . Years of education: Not on file  . Highest education level: Not on file  Occupational History  . Occupation: self employed    Fish farm manager: OTHER  Social Needs  . Financial resource strain: Not on file  . Food insecurity:    Worry: Not on file    Inability: Not on file  . Transportation needs:    Medical: Not on file    Non-medical: Not on file  Tobacco Use  . Smoking status: Current Every Day Smoker    Packs/day: 0.75    Types: Cigarettes  . Smokeless tobacco: Never Used  Substance and Sexual Activity  . Alcohol use: Yes    Alcohol/week: 6.0 oz    Types: 10 Cans of beer per week  . Drug use: No  . Sexual activity: Not on file  Lifestyle  . Physical activity:    Days per week: Not on file    Minutes per session: Not on file  . Stress: Not on file  Relationships  . Social connections:    Talks  on phone: Not on file    Gets together: Not on file    Attends religious service: Not on file    Active member of club or organization: Not on file    Attends meetings of clubs or organizations: Not on file    Relationship status: Not on file  . Intimate partner violence:    Fear of current or ex partner: Not on file    Emotionally abused: Not on file    Physically abused: Not on file    Forced sexual activity: Not on file  Other Topics Concern  . Not on file  Social History Narrative  . Not on file    Hospitiliaztions:None  Health Maintenance:    Flu: 04/2016  Tetanus: 07/2013  Pneumovax: never  Prevnar: never  Zostavax: never  Shingrix: never  Mammogram: 11/2012, [facility]  Pap Smear: 11/2012, [facility]  Bone Density: 11/2012  Colon Screening: 04/2013, every 10 years  Eye Doctor: annually  Dental Exam: annually   Providers:   PCP: Webb Silversmith, NP-C    I have  personally reviewed and have noted:  1. The patient's medical and social history 2. Their use of alcohol, tobacco or illicit drugs 3. Their current medications and supplements 4. The patient's functional ability including ADL's, fall risks, home safety risks and hearing or visual impairment. 5. Diet and physical activities 6. Evidence for depression or mood disorder  Subjective:   Review of Systems:   Constitutional: Denies fever, malaise, fatigue, headache or abrupt weight changes.  HEENT: Denies eye pain, eye redness, ear pain, ringing in the ears, wax buildup, runny nose, nasal congestion, bloody nose, or sore throat. Respiratory: Denies difficulty breathing, shortness of breath, cough or sputum production.   Cardiovascular: Denies chest pain, chest tightness, palpitations or swelling in the hands or feet.  Gastrointestinal: Denies abdominal pain, bloating, constipation, diarrhea or blood in the stool.  GU: Denies urgency, frequency, pain with urination, burning sensation, blood in urine, odor or discharge. Musculoskeletal: Pt reports intermittent sacral/tailbone pain. Denies decrease in range of motion, difficulty with gait, muscle pain or joint swelling.  Skin: Denies redness, rashes, lesions or ulcercations.  Neurological: Pt reports trouble with recall. Denies dizziness, difficulty with speech or problems with balance and coordination.  Psych: Pt reports anxiety. Denies depression, SI/HI.  No other specific complaints in a complete review of systems (except as listed in HPI above).  Objective:  PE:   BP 128/76   Pulse 65   Temp 97.6 F (36.4 C) (Oral)   Ht 5' (1.524 m)   Wt 131 lb (59.4 kg)   SpO2 98%   BMI 25.58 kg/m   Wt Readings from Last 3 Encounters:  08/02/17 134 lb (60.8 kg)  05/22/17 134 lb (60.8 kg)  02/27/17 134 lb (60.8 kg)    General: Appears her stated age, well developed, well nourished in NAD. Skin: Warm, dry and intact. N HEENT: Head: normal  shape and size; Eyes: sclera white, no icterus, conjunctiva pink, PERRLA and EOMs intact; Ears: Tm's gray and intact, normal light reflex; Throat/Mouth: Teeth present, mucosa pink and moist, no exudate, lesions or ulcerations noted.  Neck: Neck supple, trachea midline. No masses, lumps or thyromegaly present.  Cardiovascular: Normal rate and rhythm. S1,S2 noted.  No murmur, rubs or gallops noted. No JVD or BLE edema. No carotid bruits noted. Pulmonary/Chest: Normal effort and positive vesicular breath sounds. No respiratory distress. No wheezes, rales or ronchi noted.  Abdomen: Soft and nontender.  Normal bowel sounds. No distention or masses noted. Liver, spleen and kidneys non palpable. Musculoskeletal:  Strength 5/5 BUE/BLE. No signs of joint swelling.  Neurological: Alert and oriented. Cranial nerves II-XII grossly intact. Coordination normal.  Psychiatric: Mood and affect normal. Behavior is normal. Judgment and thought content normal.   ECG: Reason for ECG: Initial Medicare Wellness/HTN. Normal, no acute findings. Reviewed previous ECG from 10/2014- no changes.  BMET    Component Value Date/Time   NA 137 05/10/2016 1620   K 3.8 05/10/2016 1620   CL 99 05/10/2016 1620   CO2 31 05/10/2016 1620   GLUCOSE 87 05/10/2016 1620   BUN 12 05/10/2016 1620   CREATININE 0.70 05/10/2016 1620   CALCIUM 9.6 05/10/2016 1620    Lipid Panel     Component Value Date/Time   CHOL 267 (H) 05/10/2016 1620   TRIG 109.0 05/10/2016 1620   HDL 50.00 05/10/2016 1620   CHOLHDL 5 05/10/2016 1620   VLDL 21.8 05/10/2016 1620   LDLCALC 195 (H) 05/10/2016 1620    CBC    Component Value Date/Time   WBC 7.6 05/10/2016 1620   RBC 4.12 05/10/2016 1620   HGB 13.8 05/10/2016 1620   HCT 40.0 05/10/2016 1620   PLT 225.0 05/10/2016 1620   MCV 97.1 05/10/2016 1620   MCHC 34.4 05/10/2016 1620   RDW 13.6 05/10/2016 1620    Hgb A1C Lab Results  Component Value Date   HGBA1C 5.7 04/13/2015      Assessment  and Plan:   Medicare Annual Wellness Visit:  Diet: She does eat meat. She consumes fruits and veggies daily. She eats some fried foods. She drinks mostly green tea, red wine. Physical activity: Gardener Depression/mood screen: Negative Hearing: Intact to whispered voice Visual acuity: Grossly normal, performs annual eye exam  ADLs: Capable Fall risk: None Home safety: Good Cognitive evaluation: Decreased short term memory. Intact to orientation, naming, and repetition. MMSE 30/30 EOL planning: No adv directives, full code/ I agree  Preventative Medicine: Encouraged her to get a flu shot in the fall. Tetanus UTD. Prevnar today, will given pneumovax in 1 year. Advised her to call insurance company about Shingrix vaccine. She will call [facility] to set up pap smear, mammogram and bone density. Colon screening UTD. Encouraged her to consume a balanced diet and exercise regimen. Advised her to see an eye doctor and dentist annually. Will check CBC, CMET, Lipid and Vit D today.   Next appointment: 1 year, Medicare Wellness Exam   Webb Silversmith, NP

## 2017-10-16 NOTE — Addendum Note (Signed)
Addended by: Lurlean Nanny on: 10/16/2017 02:47 PM   Modules accepted: Orders

## 2017-10-16 NOTE — Assessment & Plan Note (Signed)
Encouraged stretching Tramadol refilled today

## 2017-10-16 NOTE — Assessment & Plan Note (Signed)
CMET and Lipid profile today Continue Rosuvastatin, will adjust if needed based on labs Encouraged her to consume a low fat diet

## 2017-10-22 ENCOUNTER — Telehealth: Payer: Self-pay

## 2017-10-22 NOTE — Telephone Encounter (Signed)
noted 

## 2017-10-22 NOTE — Telephone Encounter (Signed)
Pt walked in ; received Prevnar 13 on 06/04/9; pt said has been sore around injection site but 2 days ago has redness and warmth around injection site approx 4" in diameter. No difficulty breathing.Offered pt appt today at 3:40 but pt has meeting at school cannot miss and going out of town to [place] for 1 1/2 weeks early AM tomorrow. Avie Echevaria NP suggested if access to mychart send picture to Avie Echevaria NP or can go to [facility]. Pt does not have mychart and pt voiced understanding about going to UC prior to leaving town. FYI to Avie Echevaria NP.

## 2017-12-12 ENCOUNTER — Other Ambulatory Visit: Payer: Self-pay | Admitting: Internal Medicine

## 2018-03-08 ENCOUNTER — Other Ambulatory Visit: Payer: Self-pay | Admitting: Internal Medicine

## 2018-04-23 ENCOUNTER — Telehealth: Payer: Self-pay

## 2018-04-23 MED ORDER — LOSARTAN POTASSIUM 100 MG PO TABS: 100.0000 mg | ORAL_TABLET | Freq: Every day | ORAL | 3 refills | Status: DC

## 2018-04-23 MED ORDER — HYDROCHLOROTHIAZIDE 25 MG PO TABS: 25.0000 mg | ORAL_TABLET | Freq: Every day | ORAL | 3 refills | Status: DC

## 2018-04-23 NOTE — Telephone Encounter (Signed)
[name] [name] at [facility] called;Cannot get losartan HCTZ 100-25 mg(last refilled # 90 x 2 on 03/08/18; [facility] requests for [facility] rx for losartan 100 mg and HCTZ 25 mg to [facility]. Pt is out of med.Please advise.

## 2018-04-23 NOTE — Addendum Note (Signed)
Addended by: Jearld Fenton on: 04/23/2018 03:31 PM   Modules accepted: Orders

## 2018-04-23 NOTE — Telephone Encounter (Signed)
Medication sent to pharmacy per request

## 2018-05-27 ENCOUNTER — Ambulatory Visit (INDEPENDENT_AMBULATORY_CARE_PROVIDER_SITE_OTHER): Payer: Medicare Other | Admitting: Family Medicine

## 2018-05-27 ENCOUNTER — Encounter: Payer: Self-pay | Admitting: Family Medicine

## 2018-05-27 VITALS — BP 94/60 | HR 69 | Temp 98.7°F | Ht 60.0 in | Wt 128.5 lb

## 2018-05-27 DIAGNOSIS — R300 Dysuria: Secondary | ICD-10-CM | POA: Diagnosis not present

## 2018-05-27 LAB — POC URINALSYSI DIPSTICK (AUTOMATED)
BILIRUBIN UA: NEGATIVE
GLUCOSE UA: NEGATIVE
Nitrite, UA: NEGATIVE
PROTEIN UA: POSITIVE — AB
SPEC GRAV UA: 1.015 (ref 1.010–1.025)
Urobilinogen, UA: 0.2 E.U./dL
pH, UA: 6 (ref 5.0–8.0)

## 2018-05-27 MED ORDER — CIPROFLOXACIN HCL 500 MG PO TABS: 500.0000 mg | ORAL_TABLET | Freq: Two times a day (BID) | ORAL | 0 refills | Status: DC

## 2018-05-27 NOTE — Progress Notes (Signed)
duration of symptoms: started about 3 days ago.  She had some lower back pain initially.   Rhinorrhea: no Congestion: no ear pain: no sore throat: no Cough: minimal  Myalgias: lower back pain.  Temp up to 101 today with sweats and chills.   No blood in stool.  No GI sx o/w.   Lower BP noted, she is a little lightheaded occ.   Dec in UOP, dark.  No burning with urination.  She has urinary frequency and that is atypical.    Retired Pharmacist, hospital.   Per HPI unless specifically indicated in ROS section   Meds, vitals, and allergies reviewed.   GEN: nad, alert and oriented HEENT: mucous membranes moist, TM w/o erythema, OP wnl, nasal exam wnl NECK: supple CV: rrr.  PULM: ctab, no inc wob ABD: soft, +bs, suprapubic area tender EXT: no edema SKIN: no acute rash BACK: no CVA pain but lower back is slightly ttp B.   LLQ/RLQ not ttp, no rebound.

## 2018-05-27 NOTE — Patient Instructions (Addendum)
Skip the HCTZ for a day or two.  If needed, then update the clinic.    Drink plenty of water and start the antibiotics today.  We'll contact you with your lab report.    Take care.   Glad to see you.

## 2018-05-28 DIAGNOSIS — R300 Dysuria: Secondary | ICD-10-CM | POA: Insufficient documentation

## 2018-05-28 NOTE — Assessment & Plan Note (Signed)
The only focal sx she has are suprapubic pain, urinary frequency.  She has fever.  No cva pain on exam.  Possibly uti based on u/a, d/w pt at [place].  Concern for early pyelo but still well appearing at the [place].  D/w pt about routine cautions.  Start cipro 500mg  BID, check ucx, update [place] as needed.  Hold HCTZ until she isn't lightheaded.  Rationale for tx d/w pt.  All questions answered.   Lungs are clear, no flu like sx.   Inc PO fluids.

## 2018-05-29 LAB — URINE CULTURE
MICRO NUMBER: 46718
SPECIMEN QUALITY:: ADEQUATE

## 2018-08-06 ENCOUNTER — Other Ambulatory Visit: Payer: Self-pay | Admitting: Internal Medicine

## 2018-10-21 ENCOUNTER — Encounter: Payer: Medicare Other | Admitting: Internal Medicine

## 2018-10-31 ENCOUNTER — Other Ambulatory Visit: Payer: Self-pay | Admitting: Internal Medicine

## 2018-11-06 ENCOUNTER — Other Ambulatory Visit: Payer: Self-pay | Admitting: Internal Medicine

## 2018-11-06 NOTE — Telephone Encounter (Signed)
Best number [phone] Pt called needing a refill on   losartan rosuvastin hyrdrocholorothiazide  [facility] losartan  Is on  national back order and wanted to know if this could be changed to something else generic  Pt also needs refill Tramadol   [facility]

## 2018-11-07 NOTE — Telephone Encounter (Signed)
Other meds have refills lasting to Dec 2020, advise on Losartan? Note to pharmacy to have Tramadol TBF on or after 11/16/2018

## 2018-11-08 MED ORDER — TRAMADOL HCL 50 MG PO TABS: 50.0000 mg | ORAL_TABLET | Freq: Three times a day (TID) | ORAL | 0 refills | Status: DC | PRN

## 2018-11-08 NOTE — Telephone Encounter (Signed)
Is it all Losartan or just the 100 mg tabs? We could do 2 50 mg if need be. Will refill Tramadol.

## 2018-11-26 ENCOUNTER — Encounter: Payer: Self-pay | Admitting: Gastroenterology

## 2019-01-22 ENCOUNTER — Other Ambulatory Visit: Payer: Self-pay | Admitting: Internal Medicine

## 2019-02-10 ENCOUNTER — Other Ambulatory Visit: Payer: Self-pay

## 2019-02-10 ENCOUNTER — Ambulatory Visit (INDEPENDENT_AMBULATORY_CARE_PROVIDER_SITE_OTHER): Payer: Medicare Other | Admitting: Internal Medicine

## 2019-02-10 VITALS — BP 130/78 | HR 82 | Wt 123.0 lb

## 2019-02-10 DIAGNOSIS — F320 Major depressive disorder, single episode, mild: Secondary | ICD-10-CM

## 2019-02-10 DIAGNOSIS — F411 Generalized anxiety disorder: Secondary | ICD-10-CM

## 2019-02-10 DIAGNOSIS — F5104 Psychophysiologic insomnia: Secondary | ICD-10-CM

## 2019-02-10 MED ORDER — TRAMADOL HCL 50 MG PO TABS: 50.0000 mg | ORAL_TABLET | Freq: Three times a day (TID) | ORAL | 0 refills | Status: DC | PRN

## 2019-02-10 MED ORDER — SERTRALINE HCL 50 MG PO TABS: 25.0000 mg | ORAL_TABLET | Freq: Every day | ORAL | 0 refills | Status: DC

## 2019-02-10 MED ORDER — ALPRAZOLAM 0.25 MG PO TABS: ORAL_TABLET | ORAL | 0 refills | Status: DC
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
## 2019-02-11 ENCOUNTER — Encounter: Payer: Self-pay | Admitting: Internal Medicine

## 2019-02-11 NOTE — Assessment & Plan Note (Signed)
Support offered today PHQ 9 score of 6, GAD 7 score of 12 Will start Sertraline 25 mg PO QHS Xanax 0.25 mg refilled- sedation and addiction caution given Referral to psychology for therapy   Update me in 4 weeks and let me know how you are doing

## 2019-02-11 NOTE — Patient Instructions (Signed)

## 2019-02-13 ENCOUNTER — Telehealth: Payer: Self-pay

## 2019-02-13 NOTE — Telephone Encounter (Signed)
We have a plan for this. We are going to work on getting her mood situated first. That's going to take at least 4 weeks. We have already discussed MRI brain vs referral to neurology. We will decide on this in 4 weeks when we see if the Sertraline improves her mood. She scored 29/30 on her MMSE 2019 and 29/30 in 2020.

## 2019-02-13 NOTE — Telephone Encounter (Signed)
Holleigh, patient's daughter (ok per DPR on file), l/m stating she needed to speak to the nurse about her mom. Daughter and her brother are concern about patient. They wanted to make sure that they were both on DPR. Daughter knows patient just had a visit recently and wonders if her memory was addressed at all. She would like to speak to someone and go over details about her mom and what is all going on. Holleigh's CB number is [phone]. Sending to [facility] to follow up and get more details.
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
## 2019-02-14 NOTE — Telephone Encounter (Signed)
Left message on voicemail.

## 2019-02-20 ENCOUNTER — Ambulatory Visit: Payer: Medicare Other | Admitting: Internal Medicine

## 2019-03-11 ENCOUNTER — Ambulatory Visit (INDEPENDENT_AMBULATORY_CARE_PROVIDER_SITE_OTHER): Payer: Medicare Other | Admitting: Internal Medicine

## 2019-03-11 ENCOUNTER — Encounter: Payer: Self-pay | Admitting: Internal Medicine

## 2019-03-11 ENCOUNTER — Other Ambulatory Visit: Payer: Self-pay

## 2019-03-11 VITALS — BP 124/82 | HR 76 | Temp 98.3°F | Ht 59.75 in | Wt 125.0 lb

## 2019-03-11 DIAGNOSIS — Z23 Encounter for immunization: Secondary | ICD-10-CM | POA: Diagnosis not present

## 2019-03-11 DIAGNOSIS — F411 Generalized anxiety disorder: Secondary | ICD-10-CM

## 2019-03-11 DIAGNOSIS — I10 Essential (primary) hypertension: Secondary | ICD-10-CM | POA: Diagnosis not present

## 2019-03-11 DIAGNOSIS — Z0000 Encounter for general adult medical examination without abnormal findings: Secondary | ICD-10-CM

## 2019-03-11 DIAGNOSIS — E7800 Pure hypercholesterolemia, unspecified: Secondary | ICD-10-CM

## 2019-03-11 DIAGNOSIS — R413 Other amnesia: Secondary | ICD-10-CM | POA: Diagnosis not present

## 2019-03-11 DIAGNOSIS — E559 Vitamin D deficiency, unspecified: Secondary | ICD-10-CM

## 2019-03-11 MED ORDER — ALPRAZOLAM 0.25 MG PO TABS: ORAL_TABLET | ORAL | 0 refills | Status: DC

## 2019-03-11 MED ORDER — ZOSTER VAC RECOMB ADJUVANTED 50 MCG/0.5ML IM SUSR: 0.5000 mL | Freq: Once | INTRAMUSCULAR | 0 refills | Status: AC

## 2019-03-11 NOTE — Progress Notes (Signed)
HPI:  Pt presents to the clinic today for her subsequent annual Medicare Wellness Exam. She is also due to follow up chronic conditions.   Anxiety: Improved since starting Sertraline. She takes Xanax at bedtime with good results. She is currently seeing a therapist. She denies depression, SI/HI. There is no CSA/UDS on file.  HTN: Her BP today is 124/82. She is taking Losartan and HCTZ as prescribed. ECG from 10/2017 reviewed.  HLD: her last LDL was 125, 10/2017. She denies myalgias on Rosuvastatin. She does not consume a low fat diet.  Past Medical History:  Diagnosis Date  . Allergy   . Anxiety   . Hypertension     Current Outpatient Medications  Medication Sig Dispense Refill  . ALPRAZolam (XANAX) 0.25 MG tablet TAKE 1/2 TABLET BY MOUTH EVERY NIGHT AT BEDTIME. 15 tablet 0  . hydrochlorothiazide (HYDRODIURIL) 25 MG tablet Take 1 tablet (25 mg total) by mouth daily. 90 tablet 3  . losartan (COZAAR) 100 MG tablet Take 1 tablet (100 mg total) by mouth daily. 90 tablet 3  . rosuvastatin (CRESTOR) 10 MG tablet TAKE 1 TABLET (10 MG TOTAL) BY MOUTH DAILY. MUST SCHEDULE PHYSICAL 90 tablet 0  . sertraline (ZOLOFT) 50 MG tablet Take 0.5 tablets (25 mg total) by mouth daily. 30 tablet 0  . traMADol (ULTRAM) 50 MG tablet Take 1 tablet (50 mg total) by mouth every 8 (eight) hours as needed. for pain 30 tablet 0   No current facility-administered medications for this visit.     Allergies  Allergen Reactions  . Erythromycin     REACTION: GI upset    Family History  Problem Relation Age of Onset  . Diabetes Mother   . Stroke Mother   . Other Mother        colon sugery  . Polymyalgia rheumatica Mother   . Heart disease Father   . Diabetes Father   . Colon cancer Neg Hx   . Cancer Neg Hx     Social History   Socioeconomic History  . Marital status: Married    Spouse name: Not on file  . Number of children: 2  . Years of education: Not on file  . Highest education level: Not on  file  Occupational History  . Occupation: self employed    Fish farm manager: OTHER  Social Needs  . Financial resource strain: Not on file  . Food insecurity    Worry: Not on file    Inability: Not on file  . Transportation needs    Medical: Not on file    Non-medical: Not on file  Tobacco Use  . Smoking status: Current Every Day Smoker    Packs/day: 0.75    Types: Cigarettes  . Smokeless tobacco: Never Used  Substance and Sexual Activity  . Alcohol use: Yes    Alcohol/week: 10.0 standard drinks    Types: 10 Cans of beer per week  . Drug use: No  . Sexual activity: Not on file  Lifestyle  . Physical activity    Days per week: Not on file    Minutes per session: Not on file  . Stress: Not on file  Relationships  . Social Herbalist on phone: Not on file    Gets together: Not on file    Attends religious service: Not on file    Active member of club or organization: Not on file    Attends meetings of clubs or organizations: Not on file  Relationship status: Not on file  . Intimate partner violence    Fear of current or ex partner: Not on file    Emotionally abused: Not on file    Physically abused: Not on file    Forced sexual activity: Not on file  Other Topics Concern  . Not on file  Social History Narrative  . Not on file    Hospitiliaztions: None  Health Maintenance:    Flu: 04/2016  Tetanus: 07/2013  Pneumovax: never  Prevnar: 10/2017  Shingrix: never  Mammogram: > 5 years ago  Pap Smear: 11/2012  Bone Density: > 5 years ago  Colon Screening: 04/2013  Eye Doctor: as needed  Dental Exam: annually   Providers:   PCP: Webb Silversmith, NP  I have personally reviewed and have noted:  1. The patient's medical and social history 2. Their use of alcohol, tobacco or illicit drugs 3. Their current medications and supplements 4. The patient's functional ability including ADL's, fall risks, home safety risks and hearing or visual impairment. 5. Diet and  physical activities 6. Evidence for depression or mood disorder  Subjective:   Review of Systems:   Constitutional: Denies fever, malaise, fatigue, headache or abrupt weight changes.  HEENT: Denies eye pain, eye redness, ear pain, ringing in the ears, wax buildup, runny nose, nasal congestion, bloody nose, or sore throat. Respiratory: Denies difficulty breathing, shortness of breath, cough or sputum production.   Cardiovascular: Denies chest pain, chest tightness, palpitations or swelling in the hands or feet.  Gastrointestinal: Denies abdominal pain, bloating, constipation, diarrhea or blood in the stool.  GU: Denies urgency, frequency, pain with urination, burning sensation, blood in urine, odor or discharge. Musculoskeletal: Denies decrease in range of motion, difficulty with gait, muscle pain or joint pain and swelling.  Skin: Denies redness, rashes, lesions or ulcercations.  Neurological: Denies dizziness, difficulty with memory, difficulty with speech or problems with balance and coordination.  Psych: Pt has a history of anxiety. Denies depression, SI/HI.  No other specific complaints in a complete review of systems (except as listed in HPI above).  Objective:  PE:  BP 124/82   Pulse 76   Temp 98.3 F (36.8 C) (Temporal)   Ht 4' 11.75" (1.518 m)   Wt 56.7 kg   SpO2 98%   BMI 24.62 kg/m   Wt Readings from Last 3 Encounters:  02/10/19 123 lb (55.8 kg)  05/27/18 128 lb 8 oz (58.3 kg)  10/16/17 131 lb (59.4 kg)    General: Appears her stated age, well developed, well nourished in NAD. Skin: Warm, dry and intact. No rashes noted. HEENT: Head: normal shape and size; Eyes: sclera white, no icterus, conjunctiva pink, PERRLA and EOMs intact; Ears: Tm's gray and intact, normal light reflex;  Neck: Neck supple, trachea midline. No masses, lumps or thyromegaly present.  Cardiovascular: Normal rate and rhythm. S1,S2 noted.  No murmur, rubs or gallops noted. No JVD or BLE edema. No  carotid bruits noted. Pulmonary/Chest: Normal effort and positive vesicular breath sounds. No respiratory distress. No wheezes, rales or ronchi noted.  Abdomen: Soft and nontender. Normal bowel sounds. No distention or masses noted. Liver, spleen and kidneys non palpable. Musculoskeletal:  Strength 5/5 BUE/BLE. No difficulty with gait. Neurological: Alert and oriented. Cranial nerves II-XII grossly intact. Coordination normal.  Psychiatric: Mood and affect normal. Behavior is normal. Judgment and thought content normal.    BMET    Component Value Date/Time   NA 137 10/16/2017 1143   K 3.8  10/16/2017 1143   CL 99 10/16/2017 1143   CO2 27 10/16/2017 1143   GLUCOSE 103 (H) 10/16/2017 1143   BUN 14 10/16/2017 1143   CREATININE 0.64 10/16/2017 1143   CALCIUM 10.0 10/16/2017 1143    Lipid Panel     Component Value Date/Time   CHOL 189 10/16/2017 1143   TRIG 87.0 10/16/2017 1143   HDL 46.60 10/16/2017 1143   CHOLHDL 4 10/16/2017 1143   VLDL 17.4 10/16/2017 1143   LDLCALC 125 (H) 10/16/2017 1143    CBC    Component Value Date/Time   WBC 8.5 10/16/2017 1143   RBC 4.31 10/16/2017 1143   HGB 14.5 10/16/2017 1143   HCT 42.1 10/16/2017 1143   PLT 257.0 10/16/2017 1143   MCV 97.8 10/16/2017 1143   MCHC 34.5 10/16/2017 1143   RDW 13.6 10/16/2017 1143    Hgb A1C Lab Results  Component Value Date   HGBA1C 5.7 04/13/2015      Assessment and Plan:   Medicare Annual Wellness Visit:  Diet: She does eat meat. She consumes fruits and veggies daily. She eats some fried foods. She drinks mostly green tea and red wine.  Physical activity: Yardwork Depression/mood screen: Negative, PHQ 9 score of 0 Hearing: Intact to whispered voice Visual acuity: Grossly normal, performs annual eye exam  ADLs: Capable Fall risk: None Home safety: Good Cognitive evaluation: Decreased short term memory. Intact to orientation, naming, and repetition EOL planning: No adv directives, full code/ I  agree  Preventative Medicine: Flu and pneumovax today. Tetanus and pneumovax UTD. Will get Shingrix in 6 weeks. She declines pap smear, mammogram, bone density or colon cancer screening at this time. Encouraged her to consume a balanced diet and exercise regimen. Advised her to see an eye doctor and dentist annually. Will check CBC, CMET, Lipid, and Vit D today. Due dates for screening exam given to patient as part of her AVS.  Memory Impairment:  MRI of brain ordered Consider referral to neurology  Next appointment: 6 months, follow up chronic conditions.   Webb Silversmith, NP

## 2019-03-12 ENCOUNTER — Other Ambulatory Visit: Payer: Medicare Other

## 2019-03-16 ENCOUNTER — Encounter: Payer: Self-pay | Admitting: Internal Medicine

## 2019-03-16 NOTE — Patient Instructions (Signed)

## 2019-03-16 NOTE — Assessment & Plan Note (Signed)
CMET and Lipid profile today Encouraged her to consume a low fat diet Continue Rosuvastatin for now 

## 2019-03-16 NOTE — Assessment & Plan Note (Signed)
Continue Losartan and HCTZ Reinforced DASH diet and exercise for weight loss CMET today

## 2019-03-16 NOTE — Assessment & Plan Note (Signed)
Continue Sertraline and Xanax Xanax refilled today.  Support offered

## 2019-03-18 NOTE — Addendum Note (Signed)
Addended by: Lurlean Nanny on: 03/18/2019 12:40 PM   Modules accepted: Orders

## 2019-03-22 ENCOUNTER — Other Ambulatory Visit: Payer: Self-pay

## 2019-03-22 ENCOUNTER — Ambulatory Visit
Admission: RE | Admit: 2019-03-22 | Discharge: 2019-03-22 | Disposition: A | Discharge status: Home / Self Care | Payer: Medicare Other | Source: Ambulatory Visit | Attending: Internal Medicine | Admitting: Internal Medicine

## 2019-03-22 DIAGNOSIS — R413 Other amnesia: Secondary | ICD-10-CM | POA: Insufficient documentation

## 2019-03-26 ENCOUNTER — Telehealth: Payer: Self-pay

## 2019-03-26 NOTE — Telephone Encounter (Signed)
-----   Message from [name] Fenton, NP sent at 03/25/2019  8:19 AM EST ----- The MRI of her brain does not show any abnormal findings. Does she want to follow up with neurology for her memory loss?

## 2019-03-30 ENCOUNTER — Other Ambulatory Visit: Payer: Self-pay | Admitting: Internal Medicine

## 2019-03-30 DIAGNOSIS — F411 Generalized anxiety disorder: Secondary | ICD-10-CM

## 2019-04-07 ENCOUNTER — Other Ambulatory Visit: Payer: Self-pay | Admitting: Internal Medicine

## 2019-04-14 ENCOUNTER — Other Ambulatory Visit: Payer: Self-pay | Admitting: Internal Medicine

## 2019-04-14 DIAGNOSIS — F411 Generalized anxiety disorder: Secondary | ICD-10-CM

## 2019-04-14 NOTE — Telephone Encounter (Signed)
Last filled 03/11/2019... VO given from [facility] to call in Rx as she has left the office.Marland KitchenMarland KitchenMarland Kitchen

## 2019-04-14 NOTE — Telephone Encounter (Signed)
Patient stated she is having to go out of town today due to a family emergency and would like this prescription before she leaves town    Patient is requesting a call back once it has been sent to the pharmacy

## 2019-04-15 ENCOUNTER — Other Ambulatory Visit: Payer: Self-pay | Admitting: Internal Medicine

## 2019-04-16 ENCOUNTER — Telehealth: Payer: Self-pay

## 2019-04-16 NOTE — Telephone Encounter (Signed)
Pt left v/m requesting cb about MRI results.

## 2019-04-21 ENCOUNTER — Telehealth: Payer: Self-pay | Admitting: *Deleted

## 2019-04-21 NOTE — Telephone Encounter (Signed)
[name](on DPR)  left a voicemail stating that her mom had a MRI done and she is trying to get the results. Holly requested a call back.

## 2019-04-23 NOTE — Telephone Encounter (Signed)
See result note, I called pt on 12/7 no answer

## 2019-05-02 ENCOUNTER — Other Ambulatory Visit: Payer: Self-pay | Admitting: Internal Medicine

## 2019-05-13 ENCOUNTER — Encounter: Payer: Self-pay | Admitting: Internal Medicine

## 2019-05-13 ENCOUNTER — Other Ambulatory Visit: Payer: Self-pay

## 2019-05-13 ENCOUNTER — Ambulatory Visit (INDEPENDENT_AMBULATORY_CARE_PROVIDER_SITE_OTHER): Payer: Medicare Other | Admitting: Internal Medicine

## 2019-05-13 DIAGNOSIS — R197 Diarrhea, unspecified: Secondary | ICD-10-CM

## 2019-05-13 DIAGNOSIS — I10 Essential (primary) hypertension: Secondary | ICD-10-CM

## 2019-05-13 DIAGNOSIS — E7800 Pure hypercholesterolemia, unspecified: Secondary | ICD-10-CM | POA: Diagnosis not present

## 2019-05-13 DIAGNOSIS — E559 Vitamin D deficiency, unspecified: Secondary | ICD-10-CM

## 2019-05-13 DIAGNOSIS — F411 Generalized anxiety disorder: Secondary | ICD-10-CM

## 2019-05-13 DIAGNOSIS — R413 Other amnesia: Secondary | ICD-10-CM | POA: Diagnosis not present

## 2019-05-13 LAB — COMPREHENSIVE METABOLIC PANEL
ALT: 17 U/L (ref 0–35)
AST: 23 U/L (ref 0–37)
Albumin: 4.2 g/dL (ref 3.5–5.2)
Alkaline Phosphatase: 58 U/L (ref 39–117)
BUN: 9 mg/dL (ref 6–23)
CO2: 28 mEq/L (ref 19–32)
Calcium: 9.5 mg/dL (ref 8.4–10.5)
Chloride: 101 mEq/L (ref 96–112)
Creatinine, Ser: 0.69 mg/dL (ref 0.40–1.20)
GFR: 84.9 mL/min (ref >60.00)
Glucose, Bld: 99 mg/dL (ref 70–99)
Potassium: 4 mEq/L (ref 3.5–5.1)
Sodium: 136 mEq/L (ref 135–145)
Total Bilirubin: 0.5 mg/dL (ref 0.2–1.2)
Total Protein: 7.1 g/dL (ref 6.0–8.3)

## 2019-05-13 LAB — CBC
HCT: 42.1 % (ref 36.0–46.0)
Hemoglobin: 14.1 g/dL (ref 12.0–15.0)
MCHC: 33.6 g/dL (ref 30.0–36.0)
MCV: 99.2 fl (ref 78.0–100.0)
Platelets: 256 10*3/uL (ref 150.0–400.0)
RBC: 4.24 Mil/uL (ref 3.87–5.11)
RDW: 13.1 % (ref 11.5–15.5)
WBC: 9.4 10*3/uL (ref 4.0–10.5)

## 2019-05-13 LAB — LIPID PANEL
Cholesterol: 187 mg/dL (ref 0–200)
HDL: 41.9 mg/dL (ref >39.00)
LDL Cholesterol: 111 mg/dL — ABNORMAL HIGH (ref 0–99)
NonHDL: 145.24
Total CHOL/HDL Ratio: 4
Triglycerides: 169 mg/dL — ABNORMAL HIGH (ref 0.0–149.0)
VLDL: 33.8 mg/dL (ref 0.0–40.0)

## 2019-05-13 LAB — VITAMIN D 25 HYDROXY (VIT D DEFICIENCY, FRACTURES): VITD: 38.64 ng/mL (ref 30.00–100.00)

## 2019-05-13 LAB — AMYLASE: Amylase: 33 U/L (ref 27–131)

## 2019-05-13 LAB — LIPASE: Lipase: 28 U/L (ref 11.0–59.0)

## 2019-05-13 NOTE — Patient Instructions (Signed)

## 2019-05-13 NOTE — Progress Notes (Signed)
Virtual Visit via Video Note  I connected with Golden Circle on 05/13/19 at 12:00 PM EST by a video enabled telemedicine application and verified that I am speaking with the correct person using two identifiers.  Location: Patient: In her car Provider: Office   I discussed the limitations of evaluation and management by telemedicine and the availability of in person appointments. The patient expressed understanding and agreed to proceed.  History of Present Illness:  Pt reports diarrhea. This started 3 weeks ago. It is intermittent, occurring several times a day. The stool is watery and yellow. She has not noticed any blood in her stool. She denies nausea, vomiting, abdominal pain, bloating. She denies recent changes in diet, medications. She has not been on antibiotics recently. No one around her has similar symptoms. She has tried Imodium OTC with minimal relief.    Past Medical History:  Diagnosis Date  . Allergy   . Anxiety   . Hypertension     Current Outpatient Medications  Medication Sig Dispense Refill  . ALPRAZolam (XANAX) 0.25 MG tablet TAKE 1/2 TABLET BY MOUTH EVERY NIGHT AT BEDTIME 15 tablet 0  . hydrochlorothiazide (HYDRODIURIL) 25 MG tablet TAKE 1 TABLET BY MOUTH EVERY DAY 90 tablet 1  . losartan (COZAAR) 100 MG tablet TAKE 1 TABLET BY MOUTH EVERY DAY 30 tablet 2  . rosuvastatin (CRESTOR) 10 MG tablet Take 1 tablet (10 mg total) by mouth daily. 90 tablet 0  . sertraline (ZOLOFT) 50 MG tablet TAKE 1/2 TABLET BY MOUTH DAILY 30 tablet 3  . traMADol (ULTRAM) 50 MG tablet Take 1 tablet (50 mg total) by mouth every 8 (eight) hours as needed. for pain 30 tablet 0   No current facility-administered medications for this visit.    Allergies  Allergen Reactions  . Erythromycin     REACTION: GI upset    Family History  Problem Relation Age of Onset  . Diabetes Mother   . Stroke Mother   . Other Mother        colon sugery  . Polymyalgia rheumatica Mother   . Heart  disease Father   . Diabetes Father   . Colon cancer Neg Hx   . Cancer Neg Hx     Constitutional: Denies fever, malaise, fatigue, headache or abrupt weight changes.  Respiratory: Denies difficulty breathing, shortness of breath, cough or sputum production.   Cardiovascular: Denies chest pain, chest tightness, palpitations or swelling in the hands or feet.  Gastrointestinal: Pt reports diarrhea. Denies abdominal pain, bloating, constipation, or blood in the stool.  GU: Denies urgency, frequency, pain with urination, burning sensation, blood in urine, odor or discharge.  No other specific complaints in a complete review of systems (except as listed in HPI above).    Observations/Objective:    Wt Readings from Last 3 Encounters:  03/11/19 125 lb (56.7 kg)  02/10/19 123 lb (55.8 kg)  05/27/18 128 lb 8 oz (58.3 kg)    General: Appears her stated age, well developed, well nourished in NAD. Pulmonary/Chest: Normal effort. No respiratory distress.  Neurological: Alert and oriented.    BMET    Component Value Date/Time   NA 137 10/16/2017 1143   K 3.8 10/16/2017 1143   CL 99 10/16/2017 1143   CO2 27 10/16/2017 1143   GLUCOSE 103 (H) 10/16/2017 1143   BUN 14 10/16/2017 1143   CREATININE 0.64 10/16/2017 1143   CALCIUM 10.0 10/16/2017 1143    Lipid Panel     Component Value Date/Time  CHOL 189 10/16/2017 1143   TRIG 87.0 10/16/2017 1143   HDL 46.60 10/16/2017 1143   CHOLHDL 4 10/16/2017 1143   VLDL 17.4 10/16/2017 1143   LDLCALC 125 (H) 10/16/2017 1143    CBC    Component Value Date/Time   WBC 8.5 10/16/2017 1143   RBC 4.31 10/16/2017 1143   HGB 14.5 10/16/2017 1143   HCT 42.1 10/16/2017 1143   PLT 257.0 10/16/2017 1143   MCV 97.8 10/16/2017 1143   MCHC 34.5 10/16/2017 1143   RDW 13.6 10/16/2017 1143    Hgb A1C Lab Results  Component Value Date   HGBA1C 5.7 04/13/2015       Assessment and Plan:  Diarrhea:  She will come to get her physical labs  (CBC, CMET, Lipid and Vit D), that she never did back in October Will add amylase, lipase and GI pathogen panel Continue bland diet Continue Imodium OTC  Will follow up after labs, return precautions discussed  Follow Up Instructions:    I discussed the assessment and treatment plan with the patient. The patient was provided an opportunity to ask questions and all were answered. The patient agreed with the plan and demonstrated an understanding of the instructions.   The patient was advised to call back or seek an in-person evaluation if the symptoms worsen or if the condition fails to improve as anticipated.     Webb Silversmith, NP

## 2019-05-13 NOTE — Addendum Note (Signed)
Addended by: Cloyd Stagers on: 05/13/2019 12:56 PM   Modules accepted: Orders

## 2019-05-14 NOTE — Addendum Note (Signed)
Addended by: Ellamae Sia on: 05/14/2019 12:48 PM   Modules accepted: Orders

## 2019-05-19 LAB — GASTROINTESTINAL PATHOGEN PANEL PCR
C. difficile Tox A/B, PCR: NOT DETECTED
Campylobacter, PCR: NOT DETECTED
Cryptosporidium, PCR: NOT DETECTED
E coli (ETEC) LT/ST PCR: NOT DETECTED
E coli (STEC) stx1/stx2, PCR: NOT DETECTED
E coli 0157, PCR: NOT DETECTED
Giardia lamblia, PCR: NOT DETECTED
Norovirus, PCR: NOT DETECTED
Rotavirus A, PCR: NOT DETECTED
Salmonella, PCR: NOT DETECTED
Shigella, PCR: NOT DETECTED

## 2019-05-22 ENCOUNTER — Other Ambulatory Visit: Payer: Self-pay | Admitting: Internal Medicine

## 2019-05-22 DIAGNOSIS — F411 Generalized anxiety disorder: Secondary | ICD-10-CM

## 2019-05-22 NOTE — Telephone Encounter (Signed)
Patient said she doesn't need a refill on the Losartan. Patient wants to know if [name] wants her to take the whole pill of Alprazolam because the directions tell her to take 1/2 a pill.

## 2019-05-22 NOTE — Telephone Encounter (Signed)
Yes, it has always been ordered that way

## 2019-05-29 ENCOUNTER — Other Ambulatory Visit: Payer: Self-pay | Admitting: Internal Medicine

## 2019-05-29 NOTE — Telephone Encounter (Signed)
Last filled 02/10/2019.Marland KitchenMarland KitchenMarland Kitchen please advise

## 2019-06-02 ENCOUNTER — Other Ambulatory Visit: Payer: Self-pay | Admitting: Internal Medicine

## 2019-06-06 ENCOUNTER — Telehealth: Payer: Self-pay

## 2019-06-06 NOTE — Telephone Encounter (Signed)
Called pt in reference to confirming Rx directions and quantity for Xanax. Pt had questions, was not clear if she needed a refill, wanted to confirm if pt needed a refill for Xanax.... If so, will forward request to [facility]

## 2019-06-22 ENCOUNTER — Ambulatory Visit: Payer: PRIVATE HEALTH INSURANCE | Attending: Internal Medicine

## 2019-06-22 DIAGNOSIS — Z23 Encounter for immunization: Secondary | ICD-10-CM | POA: Insufficient documentation

## 2019-06-22 NOTE — Progress Notes (Signed)
   Covid-19 Vaccination Clinic  Name:  [name] [name]    MRN: [id number] DOB: [birth year]  06/22/2019  [name] was observed post Covid-19 immunization for 15 minutes without incidence. She was provided with Vaccine Information Sheet and instruction to access the V-Safe system.   [name] was instructed to call 911 with any severe reactions post vaccine: Marland Kitchen Difficulty breathing  . Swelling of your face and throat  . A fast heartbeat  . A bad rash all over your body  . Dizziness and weakness    Immunizations Administered    Name Date Dose VIS Date Route   Pfizer COVID-19 Vaccine 06/22/2019 12:32 PM 0.3 mL 04/25/2019 Intramuscular   Manufacturer: [place]   Lot: [id number]   [place]: [id number]

## 2019-06-27 ENCOUNTER — Telehealth: Payer: Self-pay

## 2019-06-27 NOTE — Telephone Encounter (Signed)
Received call from [place] asking for refills on Rosuvastatin, Losartan, Tramadol and Alprazolam. These were sent to [facility] in January. I left message for patient to call me back to confirm if she wants to use this pharmacy now, this pharmacy was not in patient's chart.

## 2019-06-30 NOTE — Telephone Encounter (Signed)
Patient returned call. She wanted to confirm that her prescriptions should be sent to the [facility]

## 2019-07-01 MED ORDER — SERTRALINE HCL 50 MG PO TABS: 25.0000 mg | ORAL_TABLET | Freq: Every day | ORAL | 1 refills | Status: DC

## 2019-07-01 MED ORDER — ROSUVASTATIN CALCIUM 10 MG PO TABS: 10.0000 mg | ORAL_TABLET | Freq: Every day | ORAL | 2 refills | Status: DC

## 2019-07-01 MED ORDER — HYDROCHLOROTHIAZIDE 25 MG PO TABS: 25.0000 mg | ORAL_TABLET | Freq: Every day | ORAL | 1 refills | Status: DC

## 2019-07-01 MED ORDER — LOSARTAN POTASSIUM 100 MG PO TABS: 100.0000 mg | ORAL_TABLET | Freq: Every day | ORAL | 1 refills | Status: DC

## 2019-07-01 NOTE — Addendum Note (Signed)
Addended by: Lurlean Nanny on: 07/01/2019 10:42 AM   Modules accepted: Orders

## 2019-07-01 NOTE — Telephone Encounter (Signed)
Rx sent through e-scribe  

## 2019-07-16 ENCOUNTER — Ambulatory Visit: Payer: PRIVATE HEALTH INSURANCE | Attending: Internal Medicine

## 2019-07-16 DIAGNOSIS — Z23 Encounter for immunization: Secondary | ICD-10-CM

## 2019-07-16 NOTE — Progress Notes (Signed)
   Covid-19 Vaccination Clinic  Name:  [name] [name]    MRN: [id number] DOB: [birth year]  07/16/2019  [name] was observed post Covid-19 immunization for 15 minutes without incident. She was provided with Vaccine Information Sheet and instruction to access the V-Safe system.   [name] was instructed to call 911 with any severe reactions post vaccine: Marland Kitchen Difficulty breathing  . Swelling of face and throat  . A fast heartbeat  . A bad rash all over body  . Dizziness and weakness   Immunizations Administered    Name Date Dose VIS Date Route   Pfizer COVID-19 Vaccine 07/16/2019 11:00 AM 0.3 mL 04/25/2019 Intramuscular   Manufacturer: [place]   Lot: [id number]   [place]: [id number]

## 2019-08-11 NOTE — Telephone Encounter (Signed)
Letter mailed to pt on 05/06/19.

## 2019-08-12 DIAGNOSIS — K130 Diseases of lips: Secondary | ICD-10-CM | POA: Diagnosis not present

## 2019-08-12 DIAGNOSIS — L718 Other rosacea: Secondary | ICD-10-CM | POA: Diagnosis not present

## 2019-08-12 DIAGNOSIS — L728 Other follicular cysts of the skin and subcutaneous tissue: Secondary | ICD-10-CM | POA: Diagnosis not present

## 2019-08-12 DIAGNOSIS — D225 Melanocytic nevi of trunk: Secondary | ICD-10-CM | POA: Diagnosis not present

## 2019-08-12 DIAGNOSIS — D1801 Hemangioma of skin and subcutaneous tissue: Secondary | ICD-10-CM | POA: Diagnosis not present

## 2019-08-21 ENCOUNTER — Other Ambulatory Visit: Payer: Self-pay | Admitting: Internal Medicine

## 2019-10-10 ENCOUNTER — Other Ambulatory Visit: Payer: Self-pay | Admitting: Internal Medicine

## 2019-10-10 DIAGNOSIS — F411 Generalized anxiety disorder: Secondary | ICD-10-CM

## 2019-10-11 NOTE — Telephone Encounter (Signed)
Last filled 04/13/2020...Marland Kitchen please advise

## 2019-11-03 ENCOUNTER — Other Ambulatory Visit: Payer: Self-pay | Admitting: Internal Medicine

## 2019-11-03 NOTE — Telephone Encounter (Signed)
Last filled 05/30/2019 with 1 refill... please advise

## 2019-12-10 ENCOUNTER — Telehealth: Payer: Self-pay | Admitting: *Deleted

## 2019-12-10 NOTE — Telephone Encounter (Signed)
Patient's husband called stating that his wife has a fever of 101.0, cough, chills and body aches. Patient's husband denies that she has SOB or difficulty breathing. [name] stated that he gave his wife some aleve and waiting to see if her fever comes down. Patient's husband stated that they have been doing a lot of traveling. Mr. [name] stated that they went to [place]. Patient's husband stated that they have been around a lot of people and not sure if they have been exposed to covid but they are vaccinated. Advised patient's husband if she continues with a fever and symptoms she should be evaluated and possibly tested for covid because the vaccine is not a 100%. Patient's husband stated if his wife continues to run a fever and feel bad he will take her to an urgent care to be evaluated and tested for covid. Urgent Care information and addresses were given to the patient's husband. ER precautions were given and he verbalized understanding.

## 2019-12-10 NOTE — Telephone Encounter (Signed)
noted 

## 2019-12-12 ENCOUNTER — Other Ambulatory Visit: Payer: Self-pay

## 2019-12-12 ENCOUNTER — Ambulatory Visit
Admission: EM | Admit: 2019-12-12 | Discharge: 2019-12-12 | Disposition: A | Discharge status: Home / Self Care | Payer: Medicare HMO | Attending: Internal Medicine | Admitting: Internal Medicine

## 2019-12-12 DIAGNOSIS — R52 Pain, unspecified: Secondary | ICD-10-CM | POA: Insufficient documentation

## 2019-12-12 DIAGNOSIS — R509 Fever, unspecified: Secondary | ICD-10-CM | POA: Diagnosis not present

## 2019-12-12 DIAGNOSIS — J029 Acute pharyngitis, unspecified: Secondary | ICD-10-CM | POA: Diagnosis not present

## 2019-12-12 DIAGNOSIS — Z20822 Contact with and (suspected) exposure to covid-19: Secondary | ICD-10-CM | POA: Diagnosis not present

## 2019-12-12 DIAGNOSIS — R05 Cough: Secondary | ICD-10-CM | POA: Insufficient documentation

## 2019-12-12 LAB — SARS CORONAVIRUS 2 AG (30 MIN TAT): SARS Coronavirus 2 Ag: NEGATIVE

## 2019-12-12 NOTE — Discharge Instructions (Signed)
Keep oral fluids Continue warm salt water gargle If symptoms worsen ,please return to urgent care to be seen.

## 2019-12-12 NOTE — ED Triage Notes (Signed)
Patient states that she has been having a cough, fever, body aches and chills x 4 days. States that she has her covid vaccines but is concerned that she may have covid.

## 2019-12-13 LAB — SARS CORONAVIRUS 2 (TAT 6-24 HRS): SARS Coronavirus 2: NEGATIVE

## 2019-12-29 ENCOUNTER — Telehealth: Payer: Self-pay

## 2019-12-29 ENCOUNTER — Inpatient Hospital Stay
Admission: EM | Admit: 2019-12-29 | Discharge: 2020-01-02 | DRG: 872 | Disposition: A | Discharge status: Home / Self Care | Payer: Medicare HMO | Attending: Internal Medicine | Admitting: Internal Medicine

## 2019-12-29 ENCOUNTER — Emergency Department: Payer: Medicare HMO

## 2019-12-29 ENCOUNTER — Other Ambulatory Visit: Payer: Self-pay

## 2019-12-29 DIAGNOSIS — E876 Hypokalemia: Secondary | ICD-10-CM | POA: Diagnosis present

## 2019-12-29 DIAGNOSIS — F17210 Nicotine dependence, cigarettes, uncomplicated: Secondary | ICD-10-CM | POA: Diagnosis present

## 2019-12-29 DIAGNOSIS — Z833 Family history of diabetes mellitus: Secondary | ICD-10-CM | POA: Diagnosis not present

## 2019-12-29 DIAGNOSIS — R197 Diarrhea, unspecified: Secondary | ICD-10-CM | POA: Diagnosis present

## 2019-12-29 DIAGNOSIS — R531 Weakness: Secondary | ICD-10-CM | POA: Diagnosis present

## 2019-12-29 DIAGNOSIS — F419 Anxiety disorder, unspecified: Secondary | ICD-10-CM | POA: Diagnosis not present

## 2019-12-29 DIAGNOSIS — N390 Urinary tract infection, site not specified: Secondary | ICD-10-CM

## 2019-12-29 DIAGNOSIS — Z823 Family history of stroke: Secondary | ICD-10-CM

## 2019-12-29 DIAGNOSIS — Z881 Allergy status to other antibiotic agents status: Secondary | ICD-10-CM | POA: Diagnosis not present

## 2019-12-29 DIAGNOSIS — I10 Essential (primary) hypertension: Secondary | ICD-10-CM | POA: Diagnosis not present

## 2019-12-29 DIAGNOSIS — I959 Hypotension, unspecified: Secondary | ICD-10-CM | POA: Diagnosis present

## 2019-12-29 DIAGNOSIS — E861 Hypovolemia: Secondary | ICD-10-CM | POA: Diagnosis present

## 2019-12-29 DIAGNOSIS — F32A Depression, unspecified: Secondary | ICD-10-CM | POA: Diagnosis present

## 2019-12-29 DIAGNOSIS — N12 Tubulo-interstitial nephritis, not specified as acute or chronic: Secondary | ICD-10-CM

## 2019-12-29 DIAGNOSIS — Z8616 Personal history of COVID-19: Secondary | ICD-10-CM

## 2019-12-29 DIAGNOSIS — E7800 Pure hypercholesterolemia, unspecified: Secondary | ICD-10-CM | POA: Diagnosis not present

## 2019-12-29 DIAGNOSIS — Z79899 Other long term (current) drug therapy: Secondary | ICD-10-CM

## 2019-12-29 DIAGNOSIS — E785 Hyperlipidemia, unspecified: Secondary | ICD-10-CM | POA: Diagnosis present

## 2019-12-29 DIAGNOSIS — Z880 Allergy status to penicillin: Secondary | ICD-10-CM | POA: Diagnosis not present

## 2019-12-29 DIAGNOSIS — F329 Major depressive disorder, single episode, unspecified: Secondary | ICD-10-CM | POA: Diagnosis not present

## 2019-12-29 DIAGNOSIS — A419 Sepsis, unspecified organism: Secondary | ICD-10-CM

## 2019-12-29 DIAGNOSIS — F411 Generalized anxiety disorder: Secondary | ICD-10-CM | POA: Diagnosis present

## 2019-12-29 DIAGNOSIS — Z8249 Family history of ischemic heart disease and other diseases of the circulatory system: Secondary | ICD-10-CM | POA: Diagnosis not present

## 2019-12-29 DIAGNOSIS — A4151 Sepsis due to Escherichia coli [E. coli]: Secondary | ICD-10-CM | POA: Diagnosis not present

## 2019-12-29 LAB — URINALYSIS, COMPLETE (UACMP) WITH MICROSCOPIC
Bilirubin Urine: NEGATIVE
Glucose, UA: NEGATIVE mg/dL
Ketones, ur: 5 mg/dL — AB
Nitrite: NEGATIVE
Protein, ur: 100 mg/dL — AB
Specific Gravity, Urine: 1.019 (ref 1.005–1.030)
WBC, UA: >50 WBC/hpf — ABNORMAL HIGH (ref 0–5)
pH: 5 (ref 5.0–8.0)

## 2019-12-29 LAB — CBC WITH DIFFERENTIAL/PLATELET
Abs Immature Granulocytes: 0.18 10*3/uL — ABNORMAL HIGH (ref 0.00–0.07)
Basophils Absolute: 0.1 10*3/uL (ref 0.0–0.1)
Basophils Relative: 0 %
Eosinophils Absolute: 0.1 10*3/uL (ref 0.0–0.5)
Eosinophils Relative: 1 %
HCT: 37.2 % (ref 36.0–46.0)
Hemoglobin: 12.6 g/dL (ref 12.0–15.0)
Immature Granulocytes: 1 %
Lymphocytes Relative: 6 %
Lymphs Abs: 1.2 10*3/uL (ref 0.7–4.0)
MCH: 32.7 pg (ref 26.0–34.0)
MCHC: 33.9 g/dL (ref 30.0–36.0)
MCV: 96.6 fL (ref 80.0–100.0)
Monocytes Absolute: 1.5 10*3/uL — ABNORMAL HIGH (ref 0.1–1.0)
Monocytes Relative: 7 %
Neutro Abs: 16.8 10*3/uL — ABNORMAL HIGH (ref 1.7–7.7)
Neutrophils Relative %: 85 %
Platelets: 205 10*3/uL (ref 150–400)
RBC: 3.85 MIL/uL — ABNORMAL LOW (ref 3.87–5.11)
RDW: 13.2 % (ref 11.5–15.5)
WBC: 19.8 10*3/uL — ABNORMAL HIGH (ref 4.0–10.5)
nRBC: 0 % (ref 0.0–0.2)

## 2019-12-29 LAB — COMPREHENSIVE METABOLIC PANEL
ALT: 12 U/L (ref 0–44)
AST: 18 U/L (ref 15–41)
Albumin: 3.7 g/dL (ref 3.5–5.0)
Alkaline Phosphatase: 59 U/L (ref 38–126)
Anion gap: 12 (ref 5–15)
BUN: 21 mg/dL (ref 8–23)
CO2: 25 mmol/L (ref 22–32)
Calcium: 9 mg/dL (ref 8.9–10.3)
Chloride: 96 mmol/L — ABNORMAL LOW (ref 98–111)
Creatinine, Ser: 0.9 mg/dL (ref 0.44–1.00)
GFR calc Af Amer: >60 mL/min (ref >60)
GFR calc non Af Amer: >60 mL/min (ref >60)
Glucose, Bld: 125 mg/dL — ABNORMAL HIGH (ref 70–99)
Potassium: 3.5 mmol/L (ref 3.5–5.1)
Sodium: 133 mmol/L — ABNORMAL LOW (ref 135–145)
Total Bilirubin: 1 mg/dL (ref 0.3–1.2)
Total Protein: 7.6 g/dL (ref 6.5–8.1)

## 2019-12-29 LAB — SARS CORONAVIRUS 2 BY RT PCR (HOSPITAL ORDER, PERFORMED IN ~~LOC~~ HOSPITAL LAB): SARS Coronavirus 2: NEGATIVE

## 2019-12-29 LAB — LACTIC ACID, PLASMA: Lactic Acid, Venous: 1.4 mmol/L (ref 0.5–1.9)

## 2019-12-29 MED ORDER — SODIUM CHLORIDE 0.9 % IV SOLN
INTRAVENOUS | Status: DC
  Administered 2019-12-30: 125 mL via INTRAVENOUS

## 2019-12-29 MED ORDER — SODIUM CHLORIDE 0.9 % IV SOLN
1.0000 g | Freq: Once | INTRAVENOUS | Status: AC
  Administered 2019-12-29: 1 g via INTRAVENOUS
  Filled 2019-12-29: qty 10

## 2019-12-29 MED ORDER — ENOXAPARIN SODIUM 40 MG/0.4ML ~~LOC~~ SOLN
40.0000 mg | SUBCUTANEOUS | Status: DC
  Administered 2019-12-29 – 2020-01-01 (×4): 40 mg via SUBCUTANEOUS
  Filled 2019-12-29 (×4): qty 0.4

## 2019-12-29 MED ORDER — KETOROLAC TROMETHAMINE 15 MG/ML IJ SOLN
15.0000 mg | Freq: Once | INTRAMUSCULAR | Status: AC
  Administered 2019-12-29: 15 mg via INTRAVENOUS
  Filled 2019-12-29: qty 1

## 2019-12-29 MED ORDER — SODIUM CHLORIDE 0.9 % IV BOLUS
1000.0000 mL | Freq: Once | INTRAVENOUS | Status: AC
  Administered 2019-12-29: 1000 mL via INTRAVENOUS

## 2019-12-29 MED ORDER — SODIUM CHLORIDE 0.9 % IV SOLN
1.0000 g | INTRAVENOUS | Status: DC
  Administered 2019-12-30 – 2019-12-31 (×2): 1 g via INTRAVENOUS
  Filled 2019-12-29 (×3): qty 10

## 2019-12-29 MED ORDER — ROSUVASTATIN CALCIUM 10 MG PO TABS
10.0000 mg | ORAL_TABLET | Freq: Every day | ORAL | Status: DC
  Administered 2019-12-30 – 2020-01-01 (×3): 10 mg via ORAL
  Filled 2019-12-29 (×3): qty 1

## 2019-12-29 MED ORDER — SERTRALINE HCL 50 MG PO TABS
25.0000 mg | ORAL_TABLET | Freq: Every day | ORAL | Status: DC
  Administered 2019-12-29 – 2020-01-02 (×5): 25 mg via ORAL
  Filled 2019-12-29 (×5): qty 1

## 2019-12-29 MED ORDER — ACETAMINOPHEN 325 MG PO TABS
650.0000 mg | ORAL_TABLET | Freq: Four times a day (QID) | ORAL | Status: DC | PRN
  Administered 2019-12-29 – 2020-01-02 (×6): 650 mg via ORAL
  Filled 2019-12-29 (×6): qty 2

## 2019-12-29 NOTE — ED Notes (Signed)
Pt understands a urine sample is needed. Has specimen cup and call bell. Bed locked low. Rail up.

## 2019-12-29 NOTE — ED Triage Notes (Signed)
Pt comes via POV from home with c/o fever, chills, body aches and headache for over 2 weeks. Pt states she had vaccinations but has had COVID2X

## 2019-12-29 NOTE — Telephone Encounter (Signed)
Per chart review tab pt is presently at [facility] ED. Avie Echevaria NP is out of office and will send this note to Avie Echevaria NP as PCP and Dr Einar Pheasant as provider in office.

## 2019-12-29 NOTE — ED Notes (Addendum)
[name] [name] Medic to complete temp check soon.

## 2019-12-29 NOTE — H&P (Signed)
History and Physical    [name] [name] HCW:[id number] DOB: [birth year] DOA: 12/29/2019  PCP: Jearld Fenton, NP  Patient coming from: Home  I have personally briefly reviewed patient's old medical records in [place]  Chief Complaint: persistent weakness  HPI: [name] [name] is a 69 y.o. female with medical history significant for hypertension, hyperlipidemia, anxiety and depression who presents with concerns of persistent weakness.  For the past 2 weeks, she has been feeling generalized weakness and body ache and has basically stayed in bed the whole time.  Then last week she also began to develop daily fevers up to 102.  Has been having some diarrhea every other day.  Denies any nausea or vomiting.  No abdominal pain.  Denies dysuria and increased urgency.  Has had decreased p.o. intake but has been trying to push fluids.  Denies any coughing, runny nose or sore throat.  No sick contacts.  No skin wounds.  She last traveled about a month ago to [place] but felt fine when she came back.  Patient normally drinks about several glasses of wine daily but has stopped since she felt sick 2 weeks ago.  Endorsed tobacco use of about a pack a day for the last 40 years.  Denies illicit drug use.  ED Course: She was febrile to 100.8, hypotensive on room air. She had leukocytosis of 19.8 sodium 133, potassium 3.5, glucose of 125.  Lactic acid 1.4.  UA showed moderate leukocyte, negative nitrite with many bacteria and WBC. Chest x-ray is negative.  Covid PCR test negative.  Review of Systems:  Constitutional: No Weight Change, + Fever ENT/Mouth: No sore throat, No Rhinorrhea Eyes: No Eye Pain, No Vision Changes Cardiovascular: No Chest Pain, no SOB Respiratory: No Cough, No Sputum, No Wheezing, no Dyspnea  Gastrointestinal: NO Nausea, No Vomiting, + Diarrhea, No Constipation, No Pain Genitourinary: no Urinary Incontinence, No Urgency, No Flank Pain Musculoskeletal: No Arthralgias, No  Myalgias Skin: No Skin Lesions, No Pruritus, Neuro: + Weakness, No Numbness Psych: No Anxiety/Panic, No Depression, + decrease appetite Heme/Lymph: No Bruising, No Bleeding  Past Medical History:  Diagnosis Date  . Allergy   . Anxiety   . Hypertension     Past Surgical History:  Procedure Laterality Date  . ABLATION     uterine  . ANAL FISSURE REPAIR     age 39 and college  . DILATION AND CURETTAGE OF UTERUS    . HEMORRHOID BANDING  06/02/2013  . TONSILLECTOMY     age 23     reports that she has been smoking cigarettes. She has been smoking about 0.75 packs per day. She has never used smokeless tobacco. She reports current alcohol use of about 10.0 standard drinks of alcohol per week. She reports that she does not use drugs. Social History  Allergies  Allergen Reactions  . Erythromycin Other (See Comments)    REACTION: GI upset  . Penicillins Nausea And Vomiting    Family History  Problem Relation Age of Onset  . Diabetes Mother   . Stroke Mother   . Other Mother        colon sugery  . Polymyalgia rheumatica Mother   . Heart disease Father   . Diabetes Father   . Colon cancer Neg Hx   . Cancer Neg Hx      Prior to Admission medications   Medication Sig Start Date End Date Taking? Authorizing Provider  hydrochlorothiazide (HYDRODIURIL) 25 MG tablet Take 1 tablet (25  mg total) by mouth daily. 07/01/19  Yes Jearld Fenton, NP  losartan (COZAAR) 100 MG tablet Take 1 tablet (100 mg total) by mouth daily. 07/01/19  Yes Jearld Fenton, NP  rosuvastatin (CRESTOR) 10 MG tablet Take 1 tablet (10 mg total) by mouth daily. 07/01/19  Yes Jearld Fenton, NP  sertraline (ZOLOFT) 50 MG tablet Take 0.5 tablets (25 mg total) by mouth daily. 07/01/19  Yes Baity, Coralie Keens, NP  traMADol (ULTRAM) 50 MG tablet TAKE 1 TABLET (50 MG TOTAL) BY MOUTH EVERY 8 (EIGHT) HOURS AS NEEDED. FOR PAIN Patient taking differently: Take 50 mg by mouth every 8 (eight) hours as needed for moderate pain.   11/04/19  Yes Baity, Coralie Keens, NP  ALPRAZolam Duanne Moron) 0.25 MG tablet TAKE 1/2 TABLET BY MOUTH EVERY NIGHT AT BEDTIME Patient not taking: Reported on 12/29/2019 10/14/19   Jearld Fenton, NP    Physical Exam: Vitals:   12/29/19 1849 12/29/19 1850 12/29/19 1853 12/29/19 1916  BP:   (!) 93/49   Pulse: (!) 59 63    Resp: 20     Temp:    98.9 F (37.2 C)  TempSrc: Oral   Oral  SpO2: 100% 100%    Weight:      Height:        Constitutional: NAD, calm, comfortable, nontoxic appearing elderly female laying flat in bed Vitals:   12/29/19 1849 12/29/19 1850 12/29/19 1853 12/29/19 1916  BP:   (!) 93/49   Pulse: (!) 59 63    Resp: 20     Temp:    98.9 F (37.2 C)  TempSrc: Oral   Oral  SpO2: 100% 100%    Weight:      Height:       Eyes: PERRL, lids and conjunctivae normal ENMT: Mucous membranes are moist.  Neck: normal, supple Respiratory: clear to auscultation bilaterally, no wheezing, no crackles. Normal respiratory effort. No accessory muscle use.  Cardiovascular: Regular rate and rhythm, no murmurs / rubs / gallops. No extremity edema.  Abdomen: no tenderness, no masses palpated.Bowel sounds positive.  Musculoskeletal: no clubbing / cyanosis. No joint deformity upper and lower extremities. Good ROM, no contractures. Normal muscle tone.  Skin: no rashes, lesions, ulcers. No induration Neurologic: CN 2-12 grossly intact. Sensation intact,Strength 5/5 in all 4.  Psychiatric: Normal judgment and insight. Alert and oriented x 3. Normal mood.     Labs on Admission: I have personally reviewed following labs and imaging studies  CBC: Recent Labs  Lab 12/29/19 1700  WBC 19.8*  NEUTROABS 16.8*  HGB 12.6  HCT 37.2  MCV 96.6  PLT 597   Basic Metabolic Panel: Recent Labs  Lab 12/29/19 1700  NA 133*  K 3.5  CL 96*  CO2 25  GLUCOSE 125*  BUN 21  CREATININE 0.90  CALCIUM 9.0   GFR: Estimated Creatinine Clearance: 46.5 mL/min (by C-G formula based on SCr of 0.9  mg/dL). Liver Function Tests: Recent Labs  Lab 12/29/19 1700  AST 18  ALT 12  ALKPHOS 59  BILITOT 1.0  PROT 7.6  ALBUMIN 3.7   No results for input(s): LIPASE, AMYLASE in the last 168 hours. No results for input(s): AMMONIA in the last 168 hours. Coagulation Profile: No results for input(s): INR, PROTIME in the last 168 hours. Cardiac Enzymes: No results for input(s): CKTOTAL, CKMB, CKMBINDEX, TROPONINI in the last 168 hours. BNP (last 3 results) No results for input(s): PROBNP in the last 8760 hours. HbA1C: No  results for input(s): HGBA1C in the last 72 hours. CBG: No results for input(s): GLUCAP in the last 168 hours. Lipid Profile: No results for input(s): CHOL, HDL, LDLCALC, TRIG, CHOLHDL, LDLDIRECT in the last 72 hours. Thyroid Function Tests: No results for input(s): TSH, T4TOTAL, FREET4, T3FREE, THYROIDAB in the last 72 hours. Anemia Panel: No results for input(s): VITAMINB12, FOLATE, FERRITIN, TIBC, IRON, RETICCTPCT in the last 72 hours. Urine analysis:    Component Value Date/Time   COLORURINE AMBER (A) 12/29/2019 1700   APPEARANCEUR CLOUDY (A) 12/29/2019 1700   LABSPEC 1.019 12/29/2019 1700   PHURINE 5.0 12/29/2019 1700   GLUCOSEU NEGATIVE 12/29/2019 1700   HGBUR MODERATE (A) 12/29/2019 1700   BILIRUBINUR NEGATIVE 12/29/2019 1700   BILIRUBINUR Neg 05/27/2018 1201   KETONESUR 5 (A) 12/29/2019 1700   PROTEINUR 100 (A) 12/29/2019 1700   UROBILINOGEN 0.2 05/27/2018 1201   NITRITE NEGATIVE 12/29/2019 1700   LEUKOCYTESUR MODERATE (A) 12/29/2019 1700    Radiological Exams on Admission: DG Chest 1 View  Result Date: 12/29/2019 CLINICAL DATA:  Fever for 2 weeks, body aches, headaches EXAM: CHEST  1 VIEW COMPARISON:  None. FINDINGS: Single frontal view of the chest demonstrates an unremarkable cardiac silhouette. No airspace disease, effusion, or pneumothorax. No acute bony abnormalities. IMPRESSION: 1. No acute intrathoracic process. Electronically Signed   By:  Randa Ngo M.D.   On: 12/29/2019 17:19      Assessment/Plan  Sepsis secondary to UTI Presented on admission with fever, leukocytosis with positive UA Continue IV Rocephin.  Urine culture pending. Has received 1 L of normal saline fluid in the ED.  We will continue IV normal saline infusion  Hypotension Suspect more related to hypovolemia rather than sepsis Hold home losartan and HCTZ.  Monitor with fluids.  Hyperlipidemia Continue statin  Anxiety/depression Continue sertraline  DVT prophylaxis:.Lovenox Code Status: Full Family Communication: Plan discussed with patient at bedside  disposition Plan: Home with observation Consults called:  Admission status: Observation  Status is: Observation  The patient remains OBS appropriate and will d/c before 2 midnights.  Dispo: The patient is from: Home              Anticipated d/c is to: Home              Anticipated d/c date is: 1 day              Patient currently is not medically stable to d/c.         Orene Desanctis DO [facility]   If 7PM-7AM, please contact night-coverage [email or web address]   12/29/2019, 7:32 PM

## 2019-12-29 NOTE — Telephone Encounter (Signed)
Noted, agree with ER evaluation

## 2019-12-29 NOTE — ED Notes (Signed)
See triage note. Pt has had fever and chills for 2.5 weeks per pt. Skin dry. Resp reg/unlabored currently. Cough and SOB per pt.

## 2019-12-29 NOTE — Telephone Encounter (Signed)
[name] [name] - Client TELEPHONE ADVICE RECORD [facility] Patient Name: [name] [name] Gender: Female DOB: 1950-07-16 Age: 69 Y 65 M 25 D Return Phone Number: [phone] (Primary), [phone] (Secondary) Address[name] [address] Client [facility] [name] - Client Client Site [place] - [name] Physician Webb Silversmith - NP Contact Type Call Who Is Calling Patient / Member / Family / Caregiver Call Type Triage / Clinical Caller Name [name] [name] Relationship To Patient Spouse Return Phone Number [phone] (Primary) Chief Complaint Fever (non urgent symptom) (> THREE MONTHS) Reason for Call Symptomatic / Request for [place] is transferring a husband calling for his wife who has had a low grade fever, chills and sweats for 2-3 weeks and is sleeping all the time (12-18 hours a [name]). Cannot be scheduled until fever is gone. Was seen in UC and tested Covid - on 7.30.21. Low appetite and husband is pushing liquids but she spends so much time in bed she doesn't eat or drink much. Prior to current sxs had diarrhea for a spell (not current.) Translation No Nurse Assessment Nurse: [name] Merl, RN, [name] [name] Date/Time (Eastern Time): 12/29/2019 12:58:48 PM Confirm and document reason for call. If symptomatic, describe symptoms. ---Office is transferring a husband calling for his wife who has had a low grade fever, chills and sweats for 2-3 weeks and is sleeping all the time (12-18 hours a [name]). Cannot be scheduled until fever is gone. Was seen in UC and tested Covid - on 7.30.21. Low appetite and husband is pushing liquids but she spends so much time in bed she doesn't eat or drink much Has the patient had close contact with a person known or suspected to have the novel coronavirus illness OR traveled / lives in area with major community spread (including international  travel) in the last 14 days from the onset of symptoms? * If Asymptomatic, screen for exposure and travel within the last 14 days. ---No Does the patient have any new or worsening symptoms? ---Yes Will a triage be completed? ---Yes Related visit to physician within the last 2 weeks? ---No Does the PT have any chronic conditions? (i.e. diabetes, asthma, this includes High risk factors for pregnancy, etc.) ---Unknown Is this a behavioral health or substance abuse call? ---No PLEASE NOTE: All timestamps contained within this report are represented as [place] Standard Time. CONFIDENTIALTY NOTICE: This fax transmission is intended only for the addressee. It contains information that is legally privileged, confidential or otherwise protected from use or disclosure. If you are not the intended recipient, you are strictly prohibited from reviewing, disclosing, copying using or disseminating any of this information or taking any action in reliance on or regarding this information. If you have received this fax in error, please notify us immediately by telephone so that we can arrange for its return to [place]. Phone: [phone], Toll-Free: [phone], Fax: [phone] Page: 2 of 2 Call Id: [id number] Guidelines Guideline Title Affirmed Question Affirmed Notes Nurse Date/Time [name] [name] Time) COVID-19 - Diagnosed or Suspected Patient sounds very sick or weak to the triager Amesti, RN, [name] [name] 12/29/2019 12:59:00 PM Disp. Time [name] [name] Time) Disposition Final User 12/29/2019 1:00:28 PM Go to ED Now (or PCP triage) Yes Standifer, RN, [name] [name] Disagree/Comply Comply Caller Understands Yes PreDisposition Call Doctor Care Advice Given Per Guideline GO TO ED NOW (OR PCP TRIAGE): CARE ADVICE given per COVID-19 - DIAGNOSED OR SUSPECTED (Adult) guideline.

## 2019-12-29 NOTE — ED Provider Notes (Signed)
Emergency Department Provider Note  ____________________________________________  Time seen: Approximately 4:42 PM  I have reviewed the triage vital signs and the nursing notes.   HISTORY  Chief Complaint Fever, Chills, and Generalized Body Aches   Historian Patient     HPI [name] [name] is a 69 y.o. female with a history of hypertension, presents to the emergency department with 2 weeks of headache, body aches and fever as well as diminished energy.  Patient states that she has been having night sweats.  She states that she tested positive for COVID-19 last year and her current symptoms feel similar to what she had Covid in the past.  She denies cough, shortness of breath, chest tightness or chest pain.  She lives with her husband who is currently asymptomatic.  She has had diarrhea but no vomiting.  No rash.  Patient denies pain at the neck.  No recent travel.   Past Medical History:  Diagnosis Date  . Allergy   . Anxiety   . Hypertension      Immunizations up to date:  Yes.     Past Medical History:  Diagnosis Date  . Allergy   . Anxiety   . Hypertension     Patient Active Problem List   Diagnosis Date Noted  . Anxiety and depression 10/22/2014  . HLD (hyperlipidemia) 04/27/2009  . Essential hypertension 04/27/2009    Past Surgical History:  Procedure Laterality Date  . ABLATION     uterine  . ANAL FISSURE REPAIR     age 62 and college  . DILATION AND CURETTAGE OF UTERUS    . HEMORRHOID BANDING  06/02/2013  . TONSILLECTOMY     age 89    Prior to Admission medications   Medication Sig Start Date End Date Taking? Authorizing Provider  ALPRAZolam Duanne Moron) 0.25 MG tablet TAKE 1/2 TABLET BY MOUTH EVERY NIGHT AT BEDTIME 10/14/19   Jearld Fenton, NP  hydrochlorothiazide (HYDRODIURIL) 25 MG tablet Take 1 tablet (25 mg total) by mouth daily. 07/01/19   Jearld Fenton, NP  losartan (COZAAR) 100 MG tablet Take 1 tablet (100 mg total) by mouth daily. 07/01/19    Jearld Fenton, NP  rosuvastatin (CRESTOR) 10 MG tablet Take 1 tablet (10 mg total) by mouth daily. 07/01/19   Jearld Fenton, NP  sertraline (ZOLOFT) 50 MG tablet Take 0.5 tablets (25 mg total) by mouth daily. 07/01/19   Jearld Fenton, NP  traMADol (ULTRAM) 50 MG tablet TAKE 1 TABLET (50 MG TOTAL) BY MOUTH EVERY 8 (EIGHT) HOURS AS NEEDED. FOR PAIN 11/04/19   Jearld Fenton, NP    Allergies Erythromycin  Family History  Problem Relation Age of Onset  . Diabetes Mother   . Stroke Mother   . Other Mother        colon sugery  . Polymyalgia rheumatica Mother   . Heart disease Father   . Diabetes Father   . Colon cancer Neg Hx   . Cancer Neg Hx     Social History Social History   Tobacco Use  . Smoking status: Current Every Day Smoker    Packs/day: 0.75    Types: Cigarettes  . Smokeless tobacco: Never Used  Vaping Use  . Vaping Use: Never used  Substance Use Topics  . Alcohol use: Yes    Alcohol/week: 10.0 standard drinks    Types: 10 Cans of beer per week  . Drug use: No     Review of Systems  Constitutional:  Patient has fever.  Eyes:  No discharge ENT: No upper respiratory complaints. Respiratory: no cough. No SOB/ use of accessory muscles to breath Gastrointestinal:   No nausea, no vomiting.  No diarrhea.  No constipation. Musculoskeletal: Patient has body aches. Skin: Negative for rash, abrasions, lacerations, ecchymosis.  ____________________________________________   PHYSICAL EXAM:  VITAL SIGNS: ED Triage Vitals  Enc Vitals Group     BP 12/29/19 1503 (!) 107/49     Pulse Rate 12/29/19 1503 76     Resp 12/29/19 1503 18     Temp 12/29/19 1503 (!) 100.8 F (38.2 C)     Temp src --      SpO2 12/29/19 1503 100 %     Weight 12/29/19 1504 125 lb (56.7 kg)     Height 12/29/19 1504 4\' 11"  (1.499 m)     Head Circumference --      Peak Flow --      Pain Score 12/29/19 1504 7     Pain Loc --      Pain Edu? --      Excl. in [place]? --      Constitutional:  Alert and oriented. Well appearing and in no acute distress. Eyes: Conjunctivae are normal. PERRL. EOMI. Head: Atraumatic. ENT:      Nose: No congestion/rhinnorhea.      Mouth/Throat: Mucous membranes are moist.  Neck: No stridor.  No cervical spine tenderness to palpation. Cardiovascular: Normal rate, regular rhythm. Normal S1 and S2.  Good peripheral circulation. Respiratory: Normal respiratory effort without tachypnea or retractions. Lungs CTAB. Good air entry to the bases with no decreased or absent breath sounds Gastrointestinal: Bowel sounds x 4 quadrants. Soft and nontender to palpation. No guarding or rigidity. No distention. Musculoskeletal: Full range of motion to all extremities. No obvious deformities noted Neurologic:  Normal for age. No gross focal neurologic deficits are appreciated.  Skin:  Skin is warm, dry and intact. No rash noted. Psychiatric: Mood and affect are normal for age. Speech and behavior are normal.   ____________________________________________   LABS (all labs ordered are listed, but only abnormal results are displayed)  Labs Reviewed  CBC WITH DIFFERENTIAL/PLATELET - Abnormal; Notable for the following components:      Result Value   WBC 19.8 (*)    RBC 3.85 (*)    Neutro Abs 16.8 (*)    Monocytes Absolute 1.5 (*)    Abs Immature Granulocytes 0.18 (*)    All other components within normal limits  COMPREHENSIVE METABOLIC PANEL - Abnormal; Notable for the following components:   Sodium 133 (*)    Chloride 96 (*)    Glucose, Bld 125 (*)    All other components within normal limits  URINALYSIS, COMPLETE (UACMP) WITH MICROSCOPIC - Abnormal; Notable for the following components:   Color, Urine AMBER (*)    APPearance CLOUDY (*)    Hgb urine dipstick MODERATE (*)    Ketones, ur 5 (*)    Protein, ur 100 (*)    Leukocytes,Ua MODERATE (*)    WBC, UA >50 (*)    Bacteria, UA MANY (*)    All other components within normal limits  SARS CORONAVIRUS 2 BY  RT PCR (HOSPITAL ORDER, [facility])  CULTURE, BLOOD (ROUTINE X 2)  CULTURE, BLOOD (ROUTINE X 2)  URINE CULTURE  LACTIC ACID, PLASMA   ____________________________________________  EKG   ____________________________________________  RADIOLOGY Unk Pinto, personally viewed and evaluated these images (plain radiographs) as  part of my medical decision making, as well as reviewing the written report by the radiologist.  DG Chest 1 View  Result Date: 12/29/2019 CLINICAL DATA:  Fever for 2 weeks, body aches, headaches EXAM: CHEST  1 VIEW COMPARISON:  None. FINDINGS: Single frontal view of the chest demonstrates an unremarkable cardiac silhouette. No airspace disease, effusion, or pneumothorax. No acute bony abnormalities. IMPRESSION: 1. No acute intrathoracic process. Electronically Signed   By: Randa Ngo M.D.   On: 12/29/2019 17:19    ____________________________________________    PROCEDURES  Procedure(s) performed:     Procedures     Medications  cefTRIAXone (ROCEPHIN) 1 g in sodium chloride 0.9 % 100 mL IVPB (1 g Intravenous New Bag/Given 12/29/19 1846)  sodium chloride 0.9 % bolus 1,000 mL (1,000 mLs Intravenous New Bag/Given 12/29/19 1846)     ____________________________________________   INITIAL IMPRESSION / ASSESSMENT AND PLAN / ED COURSE  Pertinent labs & imaging results that were available during my care of the patient were reviewed by me and considered in my medical decision making (see chart for details).      Assessment and Plan: Fever: Bodyaches:  Sepsis UTI 69 year old female presents to the emergency department with fever, body aches, weakness and fatigue for the past 2 weeks.  Patient was hypotensive and febrile in triage.  On physical exam, patient seemed ill with no reports of pain and no CVA tenderness elicited.  Patient had significant leukocytosis on CBC with left shift and urinalysis was concerning for  UTI.  CMP was largely unremarkable.  COVID-19 testing was negative.  Blood cultures and urine culture are in process at this time.  Patient was given supplemental fluids and Rocephin in the emergency department.  We will admit to medicine for likely urosepsis.   ____________________________________________  FINAL CLINICAL IMPRESSION(S) / ED DIAGNOSES  Final diagnoses:  Sepsis, due to unspecified organism, unspecified whether acute organ dysfunction present ([place])      NEW MEDICATIONS STARTED DURING THIS VISIT:  ED Discharge Orders    None          This chart was dictated using voice recognition software/Dragon. Despite best efforts to proofread, errors can occur which can change the meaning. Any change was purely unintentional.     Lannie Fields, PA-C 12/29/19 I[place]a Iha    Duffy Bruce, MD 01/01/20 1204

## 2019-12-29 NOTE — ED Notes (Signed)
Pt urine orange/amber. Denies taking any meds that would cause this. States her urine is normally light yellow.

## 2019-12-29 NOTE — Telephone Encounter (Signed)
Will review ER note 

## 2019-12-30 ENCOUNTER — Observation Stay: Payer: Medicare HMO

## 2019-12-30 DIAGNOSIS — Z833 Family history of diabetes mellitus: Secondary | ICD-10-CM | POA: Diagnosis not present

## 2019-12-30 DIAGNOSIS — I10 Essential (primary) hypertension: Secondary | ICD-10-CM | POA: Diagnosis present

## 2019-12-30 DIAGNOSIS — Z823 Family history of stroke: Secondary | ICD-10-CM | POA: Diagnosis not present

## 2019-12-30 DIAGNOSIS — F329 Major depressive disorder, single episode, unspecified: Secondary | ICD-10-CM | POA: Diagnosis present

## 2019-12-30 DIAGNOSIS — A419 Sepsis, unspecified organism: Secondary | ICD-10-CM | POA: Diagnosis present

## 2019-12-30 DIAGNOSIS — E861 Hypovolemia: Secondary | ICD-10-CM | POA: Diagnosis present

## 2019-12-30 DIAGNOSIS — N390 Urinary tract infection, site not specified: Secondary | ICD-10-CM | POA: Diagnosis present

## 2019-12-30 DIAGNOSIS — I959 Hypotension, unspecified: Secondary | ICD-10-CM | POA: Diagnosis present

## 2019-12-30 DIAGNOSIS — E785 Hyperlipidemia, unspecified: Secondary | ICD-10-CM | POA: Diagnosis present

## 2019-12-30 DIAGNOSIS — E876 Hypokalemia: Secondary | ICD-10-CM | POA: Diagnosis present

## 2019-12-30 DIAGNOSIS — R531 Weakness: Secondary | ICD-10-CM | POA: Diagnosis present

## 2019-12-30 DIAGNOSIS — R197 Diarrhea, unspecified: Secondary | ICD-10-CM | POA: Diagnosis present

## 2019-12-30 DIAGNOSIS — Z880 Allergy status to penicillin: Secondary | ICD-10-CM | POA: Diagnosis not present

## 2019-12-30 DIAGNOSIS — Z881 Allergy status to other antibiotic agents status: Secondary | ICD-10-CM | POA: Diagnosis not present

## 2019-12-30 DIAGNOSIS — F17210 Nicotine dependence, cigarettes, uncomplicated: Secondary | ICD-10-CM | POA: Diagnosis present

## 2019-12-30 DIAGNOSIS — Z8249 Family history of ischemic heart disease and other diseases of the circulatory system: Secondary | ICD-10-CM | POA: Diagnosis not present

## 2019-12-30 DIAGNOSIS — Z79899 Other long term (current) drug therapy: Secondary | ICD-10-CM | POA: Diagnosis not present

## 2019-12-30 DIAGNOSIS — A4151 Sepsis due to Escherichia coli [E. coli]: Secondary | ICD-10-CM | POA: Diagnosis present

## 2019-12-30 DIAGNOSIS — Z8616 Personal history of COVID-19: Secondary | ICD-10-CM | POA: Diagnosis not present

## 2019-12-30 DIAGNOSIS — F419 Anxiety disorder, unspecified: Secondary | ICD-10-CM | POA: Diagnosis present

## 2019-12-30 LAB — CBC
HCT: 33.9 % — ABNORMAL LOW (ref 36.0–46.0)
Hemoglobin: 11.3 g/dL — ABNORMAL LOW (ref 12.0–15.0)
MCH: 32.6 pg (ref 26.0–34.0)
MCHC: 33.3 g/dL (ref 30.0–36.0)
MCV: 97.7 fL (ref 80.0–100.0)
Platelets: 190 10*3/uL (ref 150–400)
RBC: 3.47 MIL/uL — ABNORMAL LOW (ref 3.87–5.11)
RDW: 13.1 % (ref 11.5–15.5)
WBC: 17.6 10*3/uL — ABNORMAL HIGH (ref 4.0–10.5)
nRBC: 0 % (ref 0.0–0.2)

## 2019-12-30 LAB — BASIC METABOLIC PANEL
Anion gap: 9 (ref 5–15)
BUN: 22 mg/dL (ref 8–23)
CO2: 26 mmol/L (ref 22–32)
Calcium: 8.6 mg/dL — ABNORMAL LOW (ref 8.9–10.3)
Chloride: 97 mmol/L — ABNORMAL LOW (ref 98–111)
Creatinine, Ser: 0.78 mg/dL (ref 0.44–1.00)
GFR calc Af Amer: >60 mL/min (ref >60)
GFR calc non Af Amer: >60 mL/min (ref >60)
Glucose, Bld: 113 mg/dL — ABNORMAL HIGH (ref 70–99)
Potassium: 3.3 mmol/L — ABNORMAL LOW (ref 3.5–5.1)
Sodium: 132 mmol/L — ABNORMAL LOW (ref 135–145)

## 2019-12-30 LAB — HIV ANTIBODY (ROUTINE TESTING W REFLEX): HIV Screen 4th Generation wRfx: NONREACTIVE

## 2019-12-30 MED ORDER — POTASSIUM CHLORIDE CRYS ER 20 MEQ PO TBCR
40.0000 meq | EXTENDED_RELEASE_TABLET | Freq: Once | ORAL | Status: AC
  Administered 2019-12-30: 40 meq via ORAL
  Filled 2019-12-30: qty 2

## 2019-12-30 MED ORDER — TRAZODONE HCL 50 MG PO TABS
50.0000 mg | ORAL_TABLET | Freq: Every day | ORAL | Status: DC
  Administered 2019-12-30 – 2020-01-01 (×3): 50 mg via ORAL
  Filled 2019-12-30 (×3): qty 1

## 2019-12-30 MED ORDER — ALPRAZOLAM 0.25 MG PO TABS
0.2500 mg | ORAL_TABLET | Freq: Three times a day (TID) | ORAL | Status: DC | PRN
  Administered 2019-12-30 – 2019-12-31 (×3): 0.25 mg via ORAL
  Filled 2019-12-30 (×3): qty 1

## 2019-12-30 NOTE — Progress Notes (Signed)
PROGRESS NOTE    ANIELA CANIGLIA  LPF:[id number] DOB: 1951/05/06 DOA: 12/29/2019 PCP: Jearld Fenton, NP    Brief Narrative: HPI: [name] [name] is a 69 y.o. female with medical history significant for hypertension, hyperlipidemia, anxiety and depression who presents with concerns of persistent weakness.  For the past 2 weeks, she has been feeling generalized weakness and body ache and has basically stayed in bed the whole time.  Then last week she also began to develop daily fevers up to 102.  Has been having some diarrhea every other day.  Denies any nausea or vomiting.  No abdominal pain.  Denies dysuria and increased urgency.  Has had decreased p.o. intake but has been trying to push fluids.  Denies any coughing, runny nose or sore throat.  No sick contacts.  No skin wounds.  She last traveled about a month ago to [place] but felt fine when she came back.  Patient normally drinks about several glasses of wine daily but has stopped since she felt sick 2 weeks ago.  Endorsed tobacco use of about a pack a day for the last 40 years.  Denies illicit drug use.  8/17: Patient seen and examined this morning.  Continues to endorse diaphoresis.  No fevers noted over interval.  T-max 100.8.   Assessment & Plan:   Principal Problem:   Sepsis secondary to UTI [facility]) Active Problems:   HLD (hyperlipidemia)   Essential hypertension   Anxiety and depression  Sepsis secondary to UTI, improving Presented on admission with fever, leukocytosis with positive UA Started on Rocephin and IV fluids Renal ultrasound done, no evidence of [place] Plan: Continue IV Rocephin Follow urine cultures Follow blood cultures Continue IVF Pain control as needed APAP as needed fever   Hypotension, resolved Suspect more related to hypovolemia rather than sepsis Hold home losartan and HCTZ.  Monitor with fluids.  Hyperlipidemia Continue statin  Anxiety/depression Continue sertraline   DVT prophylaxis:  Lovenox Code Status: Full Family Communication: Husband via phone 12/30/2019 Disposition Plan: Status is: Inpatient  Remains inpatient appropriate because:Inpatient level of care appropriate due to severity of illness   Dispo: The patient is from: Home              Anticipated d/c is to: Home              Anticipated d/c date is: 2 days              Patient currently is not medically stable to d/c.    Improving sepsis secondary to urinary tract infection.  Unclear whether there is presence of bacteremia.  Following urine and blood cultures.  Continue IV Rocephin.  Anticipate 48 hours additional inpatient needs.    Consultants:   None  Procedures:   None  Antimicrobials:   Rocephin   Subjective: Seen and examined.  Endorsing diaphoresis  Objective: Vitals:   12/29/19 2055 12/30/19 0043 12/30/19 0851 12/30/19 1208  BP:  (!) 98/57 (!) 99/52 113/63  Pulse:  (!) 56 (!) 57 60  Resp:  16 17 18   Temp: 98.7 F (37.1 C) 97.9 F (36.6 C) (!) 97.5 F (36.4 C) 98 F (36.7 C)  TempSrc: Oral Oral Oral Oral  SpO2:  100% 100% 100%  Weight:      Height:        Intake/Output Summary (Last 24 hours) at 12/30/2019 1413 Last data filed at 12/30/2019 0930 Gross per 24 hour  Intake 0 ml  Output 100 ml  Net -  100 ml   Filed Weights   12/29/19 1504  Weight: 56.7 kg    Examination:  General exam: Appears diaphoretic.  No other distress Respiratory system: Clear to auscultation. Respiratory effort normal. Cardiovascular system: S1 & S2 heard, RRR. No JVD, murmurs, rubs, gallops or clicks. No pedal edema. Gastrointestinal system: Abdomen is nondistended, soft and nontender. No organomegaly or masses felt. Normal bowel sounds heard. Central nervous system: Alert and oriented. No focal neurological deficits. Extremities: Symmetric 5 x 5 power. Skin: No rashes, lesions or ulcers Psychiatry: Judgement and insight appear normal. Mood & affect appropriate.     Data Reviewed: I  have personally reviewed following labs and imaging studies  CBC: Recent Labs  Lab 12/29/19 1700 12/30/19 0441  WBC 19.8* 17.6*  NEUTROABS 16.8*  --   HGB 12.6 11.3*  HCT 37.2 33.9*  MCV 96.6 97.7  PLT 205 174   Basic Metabolic Panel: Recent Labs  Lab 12/29/19 1700 12/30/19 0441  NA 133* 132*  K 3.5 3.3*  CL 96* 97*  CO2 25 26  GLUCOSE 125* 113*  BUN 21 22  CREATININE 0.90 0.78  CALCIUM 9.0 8.6*   GFR: Estimated Creatinine Clearance: 52.4 mL/min (by C-G formula based on SCr of 0.78 mg/dL). Liver Function Tests: Recent Labs  Lab 12/29/19 1700  AST 18  ALT 12  ALKPHOS 59  BILITOT 1.0  PROT 7.6  ALBUMIN 3.7   No results for input(s): LIPASE, AMYLASE in the last 168 hours. No results for input(s): AMMONIA in the last 168 hours. Coagulation Profile: No results for input(s): INR, PROTIME in the last 168 hours. Cardiac Enzymes: No results for input(s): CKTOTAL, CKMB, CKMBINDEX, TROPONINI in the last 168 hours. BNP (last 3 results) No results for input(s): PROBNP in the last 8760 hours. HbA1C: No results for input(s): HGBA1C in the last 72 hours. CBG: No results for input(s): GLUCAP in the last 168 hours. Lipid Profile: No results for input(s): CHOL, HDL, LDLCALC, TRIG, CHOLHDL, LDLDIRECT in the last 72 hours. Thyroid Function Tests: No results for input(s): TSH, T4TOTAL, FREET4, T3FREE, THYROIDAB in the last 72 hours. Anemia Panel: No results for input(s): VITAMINB12, FOLATE, FERRITIN, TIBC, IRON, RETICCTPCT in the last 72 hours. Sepsis Labs: Recent Labs  Lab 12/29/19 1700  LATICACIDVEN 1.4    Recent Results (from the past 240 hour(s))  Blood culture (routine x 2)     Status: None (Preliminary result)   Collection Time: 12/29/19  5:00 PM   Specimen: BLOOD  Result Value Ref Range Status   Specimen Description BLOOD RIGHT ANTECUBITAL  Final   Special Requests   Final    BOTTLES DRAWN AEROBIC AND ANAEROBIC Blood Culture adequate volume   Culture   Final     NO GROWTH < 12 HOURS Performed at [facility], [address] [place], [place] [address]    Report Status PENDING  Incomplete  Blood culture (routine x 2)     Status: None (Preliminary result)   Collection Time: 12/29/19  5:00 PM   Specimen: BLOOD  Result Value Ref Range Status   Specimen Description BLOOD LEFT ANTECUBITAL  Final   Special Requests   Final    BOTTLES DRAWN AEROBIC AND ANAEROBIC Blood Culture adequate volume   Culture   Final    NO GROWTH < 12 HOURS Performed at [facility], [address] [place], [place] [address]    Report Status PENDING  Incomplete  SARS Coronavirus 2 by RT PCR (hospital order, performed  in [place] lab) Nasopharyngeal Nasopharyngeal Swab     Status: None   Collection Time: 12/29/19  5:00 PM   Specimen: Nasopharyngeal Swab  Result Value Ref Range Status   SARS Coronavirus 2 NEGATIVE NEGATIVE Final    Comment: (NOTE) SARS-CoV-2 target nucleic acids are NOT DETECTED.  The SARS-CoV-2 RNA is generally detectable in upper and lower respiratory specimens during the acute phase of infection. The lowest concentration of SARS-CoV-2 viral copies this assay can detect is 250 copies / mL. A negative result does not preclude SARS-CoV-2 infection and should not be used as the sole basis for treatment or other patient management decisions.  A negative result may occur with improper specimen collection / handling, submission of specimen other than nasopharyngeal swab, presence of viral mutation(s) within the areas targeted by this assay, and inadequate number of viral copies (<250 copies / mL). A negative result must be combined with clinical observations, patient history, and epidemiological information.  Fact Sheet for Patients:   StrictlyIdeas.no  Fact Sheet for Healthcare Providers: BankingDealers.co.za  This test is not yet approved or  cleared by the [place]  FDA and has been authorized for detection and/or diagnosis of SARS-CoV-2 by FDA under an Emergency Use Authorization (EUA).  This EUA will remain in effect (meaning this test can be used) for the duration of the COVID-19 declaration under Section 564(b)(1) of the Act, 21 U.S.C. section 360bbb-3(b)(1), unless the authorization is terminated or revoked sooner.  Performed at [facility], [address] [place], [place] [address]          Radiology Studies: DG Chest 1 View  Result Date: 12/29/2019 CLINICAL DATA:  Fever for 2 weeks, body aches, headaches EXAM: CHEST  1 VIEW COMPARISON:  None. FINDINGS: Single frontal view of the chest demonstrates an unremarkable cardiac silhouette. No airspace disease, effusion, or pneumothorax. No acute bony abnormalities. IMPRESSION: 1. No acute intrathoracic process. Electronically Signed   By: Randa Ngo M.D.   On: 12/29/2019 17:19   US RENAL  Result Date: 12/30/2019 CLINICAL DATA:  Pyelonephritis. EXAM: RENAL / URINARY TRACT ULTRASOUND COMPLETE COMPARISON:  No prior. FINDINGS: Right Kidney: Renal measurements: 10.6 x 4.0 x 5.7 cm = volume: 126.0 mL. Echogenicity within normal limits. No mass or hydronephrosis visualized. Left Kidney: Renal measurements: 12.8 x 5.6 x 5.3 cm = volume: 197 mL. Echogenicity within normal limits. No mass. Mild caliectasis cannot be excluded. Bladder: Appears normal for degree of bladder distention. Pleural chest noted. Other: None. IMPRESSION: Mild left caliectasis cannot be excluded. No prominent hydronephrosis. Nondistended. Bilateral ureteral jets noted. Electronically Signed   By: [place]   On: 12/30/2019 11:15        Scheduled Meds: . enoxaparin (LOVENOX) injection  40 mg Subcutaneous Q24H  . rosuvastatin  10 mg Oral q1800  . sertraline  25 mg Oral Daily   Continuous Infusions: . sodium chloride 125 mL (12/30/19 1243)  . cefTRIAXone (ROCEPHIN)  IV       LOS: 0 days    Time spent:  25 minutes    Sidney Ace, MD [facility] Pager [phone]  If 7PM-7AM, please contact night-coverage 12/30/2019, 2:13 PM

## 2019-12-31 ENCOUNTER — Encounter: Payer: Self-pay | Admitting: Internal Medicine

## 2019-12-31 LAB — CBC WITH DIFFERENTIAL/PLATELET
Abs Immature Granulocytes: 0.05 10*3/uL (ref 0.00–0.07)
Basophils Absolute: 0 10*3/uL (ref 0.0–0.1)
Basophils Relative: 1 %
Eosinophils Absolute: 0.1 10*3/uL (ref 0.0–0.5)
Eosinophils Relative: 1 %
HCT: 30.9 % — ABNORMAL LOW (ref 36.0–46.0)
Hemoglobin: 10.1 g/dL — ABNORMAL LOW (ref 12.0–15.0)
Immature Granulocytes: 1 %
Lymphocytes Relative: 19 %
Lymphs Abs: 1.5 10*3/uL (ref 0.7–4.0)
MCH: 32.4 pg (ref 26.0–34.0)
MCHC: 32.7 g/dL (ref 30.0–36.0)
MCV: 99 fL (ref 80.0–100.0)
Monocytes Absolute: 1 10*3/uL (ref 0.1–1.0)
Monocytes Relative: 12 %
Neutro Abs: 5.6 10*3/uL (ref 1.7–7.7)
Neutrophils Relative %: 66 %
Platelets: 159 10*3/uL (ref 150–400)
RBC: 3.12 MIL/uL — ABNORMAL LOW (ref 3.87–5.11)
RDW: 13.4 % (ref 11.5–15.5)
WBC: 8.3 10*3/uL (ref 4.0–10.5)
nRBC: 0 % (ref 0.0–0.2)

## 2019-12-31 LAB — GASTROINTESTINAL PANEL BY PCR, STOOL (REPLACES STOOL CULTURE)

## 2019-12-31 LAB — BASIC METABOLIC PANEL
Anion gap: 7 (ref 5–15)
BUN: 8 mg/dL (ref 8–23)
CO2: 23 mmol/L (ref 22–32)
Calcium: 8.7 mg/dL — ABNORMAL LOW (ref 8.9–10.3)
Chloride: 108 mmol/L (ref 98–111)
Creatinine, Ser: 0.6 mg/dL (ref 0.44–1.00)
GFR calc Af Amer: >60 mL/min (ref >60)
GFR calc non Af Amer: >60 mL/min (ref >60)
Glucose, Bld: 96 mg/dL (ref 70–99)
Potassium: 3.6 mmol/L (ref 3.5–5.1)
Sodium: 138 mmol/L (ref 135–145)

## 2019-12-31 LAB — C DIFFICILE QUICK SCREEN W PCR REFLEX
C Diff antigen: NEGATIVE
C Diff interpretation: NOT DETECTED
C Diff toxin: NEGATIVE

## 2019-12-31 LAB — URINE CULTURE: Culture: >=100000 — AB

## 2019-12-31 MED ORDER — KCL-LACTATED RINGERS 20 MEQ/L IV SOLN
INTRAVENOUS | Status: DC
  Filled 2019-12-31 (×3): qty 1000

## 2019-12-31 MED ORDER — POTASSIUM CHLORIDE 2 MEQ/ML IV SOLN
INTRAVENOUS | Status: DC
  Filled 2019-12-31 (×3): qty 1000

## 2019-12-31 NOTE — Progress Notes (Signed)
   12/31/19 0149  Assess: MEWS Score  Temp 99 F (37.2 C)  BP (!) 112/57  Pulse Rate (!) 56  SpO2 98 %  Assess: MEWS Score  MEWS Temp 0  MEWS Systolic 0  MEWS Pulse 0  MEWS RR 0  MEWS LOC 0  MEWS Score 0  MEWS Score Color Green  Patient reassessed at this time. Resting peacefully without complaints of pain or discomfort. Will continue to monitor to ensure comfort and safety.

## 2019-12-31 NOTE — Progress Notes (Addendum)
Progress Note    [name] [name]  GHW:[id number] DOB: 16-Jun-1950  DOA: 12/29/2019 PCP: Jearld Fenton, NP      Brief Narrative:    Medical records reviewed and are as summarized below:  [name] [name] is a 69 y.o. female       Assessment/Plan:   Principal Problem:   Sepsis secondary to UTI [facility]) Active Problems:   HLD (hyperlipidemia)   Essential hypertension   Anxiety and depression   Sepsis secondary to E. coli UTI Watery diarrhea Hypokalemia-improved Recent hypotension Anxiety and depression Hyperlipidemia   PLAN  Continue IV Rocephin Change IV fluids from normal saline to Ringer's lactate with potassium chloride because of diarrhea and borderline low potassium.  Monitor BMP/electrolytes Obtain stool for C. difficile toxin and GI panel Continue psychotropics    Body mass index is 25.25 kg/m.  Diet Order            Diet Heart Room service appropriate? Yes; Fluid consistency: Thin  Diet effective now                       Medications:   . enoxaparin (LOVENOX) injection  40 mg Subcutaneous Q24H  . rosuvastatin  10 mg Oral q1800  . sertraline  25 mg Oral Daily  . traZODone  50 mg Oral QHS   Continuous Infusions: . cefTRIAXone (ROCEPHIN)  IV 1 g (12/30/19 2029)     Anti-infectives (From admission, onward)   Start     Dose/Rate Route Frequency Ordered Stop   12/30/19 2000  cefTRIAXone (ROCEPHIN) 1 g in sodium chloride 0.9 % 100 mL IVPB     Discontinue     1 g 200 mL/hr over 30 Minutes Intravenous Every 24 hours 12/29/19 1908     12/29/19 1830  cefTRIAXone (ROCEPHIN) 1 g in sodium chloride 0.9 % 100 mL IVPB        1 g 200 mL/hr over 30 Minutes Intravenous  Once 12/29/19 1827 12/29/19 1935             Family Communication/Anticipated D/C date and plan/Code Status   DVT prophylaxis: enoxaparin (LOVENOX) injection 40 mg Start: 12/29/19 2200     Code Status: Full Code  Family Communication: Plan discussed with  patient Disposition Plan:    Status is: Inpatient  Remains inpatient appropriate because:IV treatments appropriate due to intensity of illness or inability to take PO   Dispo: The patient is from: Home              Anticipated d/c is to: Home              Anticipated d/c date is: 1 day              Patient currently is not medically stable to d/c.           Subjective:   C/ watery diarrhea and generalized weakness. She said she had 5 watery stools yesterday and had 1 watery stool this morning.   Objective:    Vitals:   12/31/19 0325 12/31/19 0746 12/31/19 1153 12/31/19 1553  BP: (!) 96/58 (!) 107/51 112/63 123/66  Pulse: (!) 59 (!) 47 (!) 56 62  Resp: 16 16 16 17   Temp: 97.6 F (36.4 C) 98.3 F (36.8 C) 98.4 F (36.9 C) 100 F (37.8 C)  TempSrc: Oral Oral Oral Oral  SpO2: 99% 100% 100% 100%  Weight:      Height:  No data found.   Intake/Output Summary (Last 24 hours) at 12/31/2019 1628 Last data filed at 12/31/2019 1500 Gross per 24 hour  Intake 906.1 ml  Output --  Net 906.1 ml   Filed Weights   12/29/19 1504  Weight: 56.7 kg    Exam:  GEN: NAD SKIN: No rash EYES: EOMI ENT: MMM CV: RRR PULM: CTA B ABD: soft, ND, NT, +BS CNS: AAO x 3, non focal EXT: No edema or tenderness   Data Reviewed:   I have personally reviewed following labs and imaging studies:  Labs: Labs show the following:   Basic Metabolic Panel: Recent Labs  Lab 12/29/19 1700 12/29/19 1700 12/30/19 0441 12/31/19 0904  NA 133*  --  132* 138  K 3.5   < > 3.3* 3.6  CL 96*  --  97* 108  CO2 25  --  26 23  GLUCOSE 125*  --  113* 96  BUN 21  --  22 8  CREATININE 0.90  --  0.78 0.60  CALCIUM 9.0  --  8.6* 8.7*   < > = values in this interval not displayed.   GFR Estimated Creatinine Clearance: 52.4 mL/min (by C-G formula based on SCr of 0.6 mg/dL). Liver Function Tests: Recent Labs  Lab 12/29/19 1700  AST 18  ALT 12  ALKPHOS 59  BILITOT 1.0  PROT 7.6   ALBUMIN 3.7   No results for input(s): LIPASE, AMYLASE in the last 168 hours. No results for input(s): AMMONIA in the last 168 hours. Coagulation profile No results for input(s): INR, PROTIME in the last 168 hours.  CBC: Recent Labs  Lab 12/29/19 1700 12/30/19 0441 12/31/19 0904  WBC 19.8* 17.6* 8.3  NEUTROABS 16.8*  --  5.6  HGB 12.6 11.3* 10.1*  HCT 37.2 33.9* 30.9*  MCV 96.6 97.7 99.0  PLT 205 190 159   Cardiac Enzymes: No results for input(s): CKTOTAL, CKMB, CKMBINDEX, TROPONINI in the last 168 hours. BNP (last 3 results) No results for input(s): PROBNP in the last 8760 hours. CBG: No results for input(s): GLUCAP in the last 168 hours. D-Dimer: No results for input(s): DDIMER in the last 72 hours. Hgb A1c: No results for input(s): HGBA1C in the last 72 hours. Lipid Profile: No results for input(s): CHOL, HDL, LDLCALC, TRIG, CHOLHDL, LDLDIRECT in the last 72 hours. Thyroid function studies: No results for input(s): TSH, T4TOTAL, T3FREE, THYROIDAB in the last 72 hours.  Invalid input(s): FREET3 Anemia work up: No results for input(s): VITAMINB12, FOLATE, FERRITIN, TIBC, IRON, RETICCTPCT in the last 72 hours. Sepsis Labs: Recent Labs  Lab 12/29/19 1700 12/30/19 0441 12/31/19 0904  WBC 19.8* 17.6* 8.3  LATICACIDVEN 1.4  --   --     Microbiology Recent Results (from the past 240 hour(s))  Blood culture (routine x 2)     Status: None (Preliminary result)   Collection Time: 12/29/19  5:00 PM   Specimen: BLOOD  Result Value Ref Range Status   Specimen Description BLOOD RIGHT ANTECUBITAL  Final   Special Requests   Final    BOTTLES DRAWN AEROBIC AND ANAEROBIC Blood Culture adequate volume   Culture   Final    NO GROWTH 2 DAYS Performed at [facility], [address] [place], [place] [address]    Report Status PENDING  Incomplete  Blood culture (routine x 2)     Status: None (Preliminary result)   Collection Time: 12/29/19  5:00 PM   Specimen:  BLOOD  Result Value  Ref Range Status   Specimen Description BLOOD LEFT ANTECUBITAL  Final   Special Requests   Final    BOTTLES DRAWN AEROBIC AND ANAEROBIC Blood Culture adequate volume   Culture   Final    NO GROWTH 2 DAYS Performed at [facility], [address] [place], [place] [address]    Report Status PENDING  Incomplete  SARS Coronavirus 2 by RT PCR (hospital order, performed in [facility] hospital lab) Nasopharyngeal Nasopharyngeal Swab     Status: None   Collection Time: 12/29/19  5:00 PM   Specimen: Nasopharyngeal Swab  Result Value Ref Range Status   SARS Coronavirus 2 NEGATIVE NEGATIVE Final    Comment: (NOTE) SARS-CoV-2 target nucleic acids are NOT DETECTED.  The SARS-CoV-2 RNA is generally detectable in upper and lower respiratory specimens during the acute phase of infection. The lowest concentration of SARS-CoV-2 viral copies this assay can detect is 250 copies / mL. A negative result does not preclude SARS-CoV-2 infection and should not be used as the sole basis for treatment or other patient management decisions.  A negative result may occur with improper specimen collection / handling, submission of specimen other than nasopharyngeal swab, presence of viral mutation(s) within the areas targeted by this assay, and inadequate number of viral copies (<250 copies / mL). A negative result must be combined with clinical observations, patient history, and epidemiological information.  Fact Sheet for Patients:   StrictlyIdeas.no  Fact Sheet for Healthcare Providers: BankingDealers.co.za  This test is not yet approved or  cleared by the [place] FDA and has been authorized for detection and/or diagnosis of SARS-CoV-2 by FDA under an Emergency Use Authorization (EUA).  This EUA will remain in effect (meaning this test can be used) for the duration of the COVID-19 declaration under Section 564(b)(1) of the  Act, 21 U.S.C. section 360bbb-3(b)(1), unless the authorization is terminated or revoked sooner.  Performed at [facility], [address] [place], [place] [address]   Urine culture     Status: Abnormal   Collection Time: 12/29/19  5:00 PM   Specimen: Urine, Random  Result Value Ref Range Status   Specimen Description   Final    URINE, RANDOM Performed at [facility], [address] [place], [place] [address]    Special Requests   Final    NONE Performed at [facility], [place], [place] [address]    Culture >=100,000 COLONIES/mL ESCHERICHIA COLI (A)  Final   Report Status 12/31/2019 FINAL  Final   Organism ID, Bacteria ESCHERICHIA COLI (A)  Final      Susceptibility   Escherichia coli - MIC*    AMPICILLIN >=32 RESISTANT Resistant     CEFAZOLIN <=4 SENSITIVE Sensitive     CEFTRIAXONE <=0.25 SENSITIVE Sensitive     CIPROFLOXACIN <=0.25 SENSITIVE Sensitive     GENTAMICIN <=1 SENSITIVE Sensitive     IMIPENEM <=0.25 SENSITIVE Sensitive     NITROFURANTOIN <=16 SENSITIVE Sensitive     TRIMETH/SULFA >=320 RESISTANT Resistant     AMPICILLIN/SULBACTAM >=32 RESISTANT Resistant     PIP/TAZO 64 INTERMEDIATE Intermediate     * >=100,000 COLONIES/mL ESCHERICHIA COLI    Procedures and diagnostic studies:  DG Chest 1 View  Result Date: 12/29/2019 CLINICAL DATA:  Fever for 2 weeks, body aches, headaches EXAM: CHEST  1 VIEW COMPARISON:  None. FINDINGS: Single frontal view of the chest demonstrates an unremarkable cardiac silhouette. No airspace disease, effusion, or pneumothorax. No acute  bony abnormalities. IMPRESSION: 1. No acute intrathoracic process. Electronically Signed   By: Randa Ngo M.D.   On: 12/29/2019 17:19   US RENAL  Result Date: 12/30/2019 CLINICAL DATA:  Pyelonephritis. EXAM: RENAL / URINARY TRACT ULTRASOUND COMPLETE COMPARISON:  No prior. FINDINGS: Right Kidney: Renal measurements: 10.6 x 4.0 x 5.7 cm = volume: 126.0  mL. Echogenicity within normal limits. No mass or hydronephrosis visualized. Left Kidney: Renal measurements: 12.8 x 5.6 x 5.3 cm = volume: 197 mL. Echogenicity within normal limits. No mass. Mild caliectasis cannot be excluded. Bladder: Appears normal for degree of bladder distention. Pleural chest noted. Other: None. IMPRESSION: Mild left caliectasis cannot be excluded. No prominent hydronephrosis. Nondistended. Bilateral ureteral jets noted. Electronically Signed   By: Marcello Moores  Register   On: 12/30/2019 11:15               LOS: 1 day   Clemie General  [facility]   Pager on [email or web address]. If 7PM-7AM, please contact night-coverage at [email or web address]     12/31/2019, 4:28 PM

## 2019-12-31 NOTE — Plan of Care (Signed)
  Problem: Health Behavior/Discharge Planning: Goal: Ability to manage health-related needs will improve Outcome: Progressing   

## 2019-12-31 NOTE — Progress Notes (Addendum)
   12/30/19 2345  Assess: MEWS Score  Temp (!) 102.4 F (39.1 C)  BP (!) 126/43  Pulse Rate 72  Resp 17  SpO2 99 %  O2 Device Room Air  Assess: MEWS Score  MEWS Temp 2  MEWS Systolic 0  MEWS Pulse 0  MEWS RR 0  MEWS LOC 0  MEWS Score 2  MEWS Score Color Yellow  Assess: if the MEWS score is Yellow or Red  Were vital signs taken at a resting state? Yes  Focused Assessment Change from prior assessment (see assessment flowsheet)  Early Detection of Sepsis Score *See Row Information* High  MEWS guidelines implemented *See Row Information* No, vital signs rechecked  Treat  MEWS Interventions Administered prn meds/treatments  Pain Scale 0-10  Pain Score 0  Notify: Charge Nurse/RN  Name of Charge Nurse/RN Notified Steve, RN   Date Charge Nurse/RN Notified 12/31/19  Time Charge Nurse/RN Notified 2350  PRN for fever administered. Patient alert and denies pain or discomfort, will continue to monitor to ensure comfort.

## 2020-01-01 LAB — BASIC METABOLIC PANEL
Anion gap: 10 (ref 5–15)
BUN: 7 mg/dL — ABNORMAL LOW (ref 8–23)
CO2: 22 mmol/L (ref 22–32)
Calcium: 8.5 mg/dL — ABNORMAL LOW (ref 8.9–10.3)
Chloride: 108 mmol/L (ref 98–111)
Creatinine, Ser: 0.56 mg/dL (ref 0.44–1.00)
GFR calc Af Amer: >60 mL/min (ref >60)
GFR calc non Af Amer: >60 mL/min (ref >60)
Glucose, Bld: 87 mg/dL (ref 70–99)
Potassium: 3.4 mmol/L — ABNORMAL LOW (ref 3.5–5.1)
Sodium: 140 mmol/L (ref 135–145)

## 2020-01-01 LAB — MAGNESIUM: Magnesium: 1.8 mg/dL (ref 1.7–2.4)

## 2020-01-01 MED ORDER — CEPHALEXIN 500 MG PO CAPS
500.0000 mg | ORAL_CAPSULE | Freq: Three times a day (TID) | ORAL | Status: DC
  Administered 2020-01-01 – 2020-01-02 (×2): 500 mg via ORAL
  Filled 2020-01-01 (×2): qty 1

## 2020-01-01 MED ORDER — POTASSIUM CHLORIDE CRYS ER 20 MEQ PO TBCR
40.0000 meq | EXTENDED_RELEASE_TABLET | Freq: Once | ORAL | Status: AC
  Administered 2020-01-01: 40 meq via ORAL
  Filled 2020-01-01: qty 2

## 2020-01-01 MED ORDER — MAGNESIUM SULFATE 2 GM/50ML IV SOLN
2.0000 g | Freq: Once | INTRAVENOUS | Status: AC
  Administered 2020-01-01: 2 g via INTRAVENOUS
  Filled 2020-01-01: qty 50

## 2020-01-01 NOTE — Progress Notes (Addendum)
Progress Note    [name]  KDT:[id number] DOB: 05/06/1951  DOA: 12/29/2019 PCP: Jearld Fenton, NP      Brief Narrative:    Medical records reviewed and are as summarized below:  [name] [name] is a 69 y.o. female       Assessment/Plan:   Principal Problem:   Sepsis secondary to UTI [facility]) Active Problems:   HLD (hyperlipidemia)   Essential hypertension   Anxiety and depression   Sepsis secondary to E. coli UTI Watery diarrhea-stool for C. difficile toxin and GI panel was negative. Hypokalemia-potassium is worse today Hypotension-resolved Anxiety and depression Hyperlipidemia Generalized weakness   PLAN  Change IV Rocephin to oral cephalexin Continue low rate IV fluids with potassium supplement Add oral potassium chloride Monitor BMP/electrolytes Continue psychotropics Encouraged ambulation in the hallway.  Consider PT evaluation if patient is unable to ambulate by herself. Plan for discharge to home tomorrow.    Body mass index is 25.25 kg/m.  Diet Order            Diet Heart Room service appropriate? Yes; Fluid consistency: Thin  Diet effective now                       Medications:   . cephALEXin  500 mg Oral Q8H  . enoxaparin (LOVENOX) injection  40 mg Subcutaneous Q24H  . rosuvastatin  10 mg Oral q1800  . sertraline  25 mg Oral Daily  . traZODone  50 mg Oral QHS   Continuous Infusions: . lactated ringers 1,000 mL with potassium chloride 20 mEq infusion 50 mL/hr at 01/01/20 0500     Anti-infectives (From admission, onward)   Start     Dose/Rate Route Frequency Ordered Stop   01/01/20 2200  cephALEXin (KEFLEX) capsule 500 mg        500 mg Oral Every 8 hours 01/01/20 1158 01/04/20 2159   12/30/19 2000  cefTRIAXone (ROCEPHIN) 1 g in sodium chloride 0.9 % 100 mL IVPB  Status:  Discontinued        1 g 200 mL/hr over 30 Minutes Intravenous Every 24 hours 12/29/19 1908 01/01/20 1158   12/29/19 1830  cefTRIAXone (ROCEPHIN) 1  g in sodium chloride 0.9 % 100 mL IVPB        1 g 200 mL/hr over 30 Minutes Intravenous  Once 12/29/19 1827 12/29/19 1935             Family Communication/Anticipated D/C date and plan/Code Status   DVT prophylaxis: enoxaparin (LOVENOX) injection 40 mg Start: 12/29/19 2200     Code Status: Full Code  Family Communication: Plan discussed with patient Disposition Plan:    Status is: Inpatient  Remains inpatient appropriate because:IV treatments appropriate due to intensity of illness or inability to take PO   Dispo: The patient is from: Home              Anticipated d/c is to: Home              Anticipated d/c date is: 1 day              Patient currently is not medically stable to d/c.           Subjective:   C/o " I don't feel good".  She also complains of generalized weakness.  She had about 4-5 loose watery stools yesterday.  She thinks she had one watery stool in the early hours of the morning.  No vomiting or abdominal pain.  She said she did not feel well enough to go home when she was told that she may be discharged home today.   Objective:    Vitals:   01/01/20 0012 01/01/20 0400 01/01/20 0732 01/01/20 1154  BP: (!) 125/55 (!) 122/54 (!) 133/55 (!) 112/51  Pulse: (!) 56 61 (!) 53 (!) 50  Resp: 17 16 17 17   Temp: 98.7 F (37.1 C) 99.4 F (37.4 C) 99.1 F (37.3 C) 98.5 F (36.9 C)  TempSrc: Oral Oral Oral Oral  SpO2: 98% 97% 100% 99%  Weight:      Height:       No data found.   Intake/Output Summary (Last 24 hours) at 01/01/2020 1228 Last data filed at 01/01/2020 0900 Gross per 24 hour  Intake 1220.42 ml  Output --  Net 1220.42 ml   Filed Weights   12/29/19 1504  Weight: 56.7 kg    Exam:  GEN: NAD SKIN: No rash EYES: No pallor or icterus  ENT: MMM CV: RRR PULM: No wheezing or rales heard ABD: soft, ND, NT, +BS CNS: AAO x 3, non focal EXT: No edema or tenderness   Data Reviewed:   I have personally reviewed following  labs and imaging studies:  Labs: Labs show the following:   Basic Metabolic Panel: Recent Labs  Lab 12/29/19 1700 12/29/19 1700 12/30/19 0441 12/30/19 0441 12/31/19 0904 01/01/20 0405  NA 133*  --  132*  --  138 140  K 3.5   < > 3.3*   < > 3.6 3.4*  CL 96*  --  97*  --  108 108  CO2 25  --  26  --  23 22  GLUCOSE 125*  --  113*  --  96 87  BUN 21  --  22  --  8 7*  CREATININE 0.90  --  0.78  --  0.60 0.56  CALCIUM 9.0  --  8.6*  --  8.7* 8.5*  MG  --   --   --   --   --  1.8   < > = values in this interval not displayed.   GFR Estimated Creatinine Clearance: 52.4 mL/min (by C-G formula based on SCr of 0.56 mg/dL). Liver Function Tests: Recent Labs  Lab 12/29/19 1700  AST 18  ALT 12  ALKPHOS 59  BILITOT 1.0  PROT 7.6  ALBUMIN 3.7   No results for input(s): LIPASE, AMYLASE in the last 168 hours. No results for input(s): AMMONIA in the last 168 hours. Coagulation profile No results for input(s): INR, PROTIME in the last 168 hours.  CBC: Recent Labs  Lab 12/29/19 1700 12/30/19 0441 12/31/19 0904  WBC 19.8* 17.6* 8.3  NEUTROABS 16.8*  --  5.6  HGB 12.6 11.3* 10.1*  HCT 37.2 33.9* 30.9*  MCV 96.6 97.7 99.0  PLT 205 190 159   Cardiac Enzymes: No results for input(s): CKTOTAL, CKMB, CKMBINDEX, TROPONINI in the last 168 hours. BNP (last 3 results) No results for input(s): PROBNP in the last 8760 hours. CBG: No results for input(s): GLUCAP in the last 168 hours. D-Dimer: No results for input(s): DDIMER in the last 72 hours. Hgb A1c: No results for input(s): HGBA1C in the last 72 hours. Lipid Profile: No results for input(s): CHOL, HDL, LDLCALC, TRIG, CHOLHDL, LDLDIRECT in the last 72 hours. Thyroid function studies: No results for input(s): TSH, T4TOTAL, T3FREE, THYROIDAB in the last 72 hours.  Invalid input(s): FREET3 Anemia work  up: No results for input(s): VITAMINB12, FOLATE, FERRITIN, TIBC, IRON, RETICCTPCT in the last 72 hours. Sepsis Labs: Recent  Labs  Lab 12/29/19 1700 12/30/19 0441 12/31/19 0904  WBC 19.8* 17.6* 8.3  LATICACIDVEN 1.4  --   --     Microbiology Recent Results (from the past 240 hour(s))  Blood culture (routine x 2)     Status: None (Preliminary result)   Collection Time: 12/29/19  5:00 PM   Specimen: BLOOD  Result Value Ref Range Status   Specimen Description BLOOD RIGHT ANTECUBITAL  Final   Special Requests   Final    BOTTLES DRAWN AEROBIC AND ANAEROBIC Blood Culture adequate volume   Culture   Final    NO GROWTH 3 DAYS Performed at [facility], [address], [place], [place] [address]    Report Status PENDING  Incomplete  Blood culture (routine x 2)     Status: None (Preliminary result)   Collection Time: 12/29/19  5:00 PM   Specimen: BLOOD  Result Value Ref Range Status   Specimen Description BLOOD LEFT ANTECUBITAL  Final   Special Requests   Final    BOTTLES DRAWN AEROBIC AND ANAEROBIC Blood Culture adequate volume   Culture   Final    NO GROWTH 3 DAYS Performed at [facility], [address], [place], [place] [address]    Report Status PENDING  Incomplete  SARS Coronavirus 2 by RT PCR (hospital order, performed in [place] hospital lab) Nasopharyngeal Nasopharyngeal Swab     Status: None   Collection Time: 12/29/19  5:00 PM   Specimen: Nasopharyngeal Swab  Result Value Ref Range Status   SARS Coronavirus 2 NEGATIVE NEGATIVE Final    Comment: (NOTE) SARS-CoV-2 target nucleic acids are NOT DETECTED.  The SARS-CoV-2 RNA is generally detectable in upper and lower respiratory specimens during the acute phase of infection. The lowest concentration of SARS-CoV-2 viral copies this assay can detect is 250 copies / mL. A negative result does not preclude SARS-CoV-2 infection and should not be used as the sole basis for treatment or other patient management decisions.  A negative result may occur with improper specimen collection / handling, submission of specimen  other than nasopharyngeal swab, presence of viral mutation(s) within the areas targeted by this assay, and inadequate number of viral copies (<250 copies / mL). A negative result must be combined with clinical observations, patient history, and epidemiological information.  Fact Sheet for Patients:   StrictlyIdeas.no  Fact Sheet for Healthcare Providers: BankingDealers.co.za  This test is not yet approved or  cleared by the [place] FDA and has been authorized for detection and/or diagnosis of SARS-CoV-2 by FDA under an Emergency Use Authorization (EUA).  This EUA will remain in effect (meaning this test can be used) for the duration of the COVID-19 declaration under Section 564(b)(1) of the Act, 21 U.S.C. section 360bbb-3(b)(1), unless the authorization is terminated or revoked sooner.  Performed at [facility], [address] [place], [place] [address]   Urine culture     Status: Abnormal   Collection Time: 12/29/19  5:00 PM   Specimen: Urine, Random  Result Value Ref Range Status   Specimen Description   Final    URINE, RANDOM Performed at [facility], [address], [place], [place] [address]    Special Requests   Final    NONE Performed at [facility], [address], [place], [place] [address]    Culture >=100,000 COLONIES/mL ESCHERICHIA  COLI (A)  Final   Report Status 12/31/2019 FINAL  Final   Organism ID, Bacteria ESCHERICHIA COLI (A)  Final      Susceptibility   Escherichia coli - MIC*    AMPICILLIN >=32 RESISTANT Resistant     CEFAZOLIN <=4 SENSITIVE Sensitive     CEFTRIAXONE <=0.25 SENSITIVE Sensitive     CIPROFLOXACIN <=0.25 SENSITIVE Sensitive     GENTAMICIN <=1 SENSITIVE Sensitive     IMIPENEM <=0.25 SENSITIVE Sensitive     NITROFURANTOIN <=16 SENSITIVE Sensitive     TRIMETH/SULFA >=320 RESISTANT Resistant     AMPICILLIN/SULBACTAM >=32 RESISTANT Resistant     PIP/TAZO  64 INTERMEDIATE Intermediate     * >=100,000 COLONIES/mL ESCHERICHIA COLI  C Difficile Quick Screen w PCR reflex     Status: None   Collection Time: 12/31/19  3:51 PM   Specimen: STOOL  Result Value Ref Range Status   C Diff antigen NEGATIVE NEGATIVE Final   C Diff toxin NEGATIVE NEGATIVE Final   C Diff interpretation No C. difficile detected.  Final    Comment: Performed at [facility], [address] [place], [place] [address]  Gastrointestinal Panel by PCR , Stool     Status: None   Collection Time: 12/31/19  3:51 PM   Specimen: Stool  Result Value Ref Range Status   Campylobacter species NOT DETECTED NOT DETECTED Final   Plesimonas shigelloides NOT DETECTED NOT DETECTED Final   Salmonella species NOT DETECTED NOT DETECTED Final   Yersinia enterocolitica NOT DETECTED NOT DETECTED Final   Vibrio species NOT DETECTED NOT DETECTED Final   Vibrio cholerae NOT DETECTED NOT DETECTED Final   Enteroaggregative E coli (EAEC) NOT DETECTED NOT DETECTED Final   Enteropathogenic E coli (EPEC) NOT DETECTED NOT DETECTED Final   Enterotoxigenic E coli (ETEC) NOT DETECTED NOT DETECTED Final   Shiga like toxin producing E coli (STEC) NOT DETECTED NOT DETECTED Final   Shigella/Enteroinvasive E coli (EIEC) NOT DETECTED NOT DETECTED Final   Cryptosporidium NOT DETECTED NOT DETECTED Final   Cyclospora cayetanensis NOT DETECTED NOT DETECTED Final   Entamoeba histolytica NOT DETECTED NOT DETECTED Final   Giardia lamblia NOT DETECTED NOT DETECTED Final   Adenovirus F40/41 NOT DETECTED NOT DETECTED Final   Astrovirus NOT DETECTED NOT DETECTED Final   Norovirus GI/GII NOT DETECTED NOT DETECTED Final   Rotavirus A NOT DETECTED NOT DETECTED Final   Sapovirus (I, II, IV, and V) NOT DETECTED NOT DETECTED Final    Comment: Performed at [facility], [address] [place], [place] [address]    Procedures and diagnostic studies:  No results found.             LOS: 2 days    Lowana Hable  [facility]   Pager on [email or web address]. If 7PM-7AM, please contact night-coverage at [email or web address]     01/01/2020, 12:28 PM

## 2020-01-01 NOTE — Progress Notes (Signed)
Writer walked patient in hallway, she did  h x1 lap around nursing station. One episode stating slight dizziness however was able to continue her walk.

## 2020-01-02 ENCOUNTER — Telehealth: Payer: Self-pay

## 2020-01-02 LAB — POTASSIUM: Potassium: 3.7 mmol/L (ref 3.5–5.1)

## 2020-01-02 LAB — MAGNESIUM: Magnesium: 2 mg/dL (ref 1.7–2.4)

## 2020-01-02 MED ORDER — CEPHALEXIN 500 MG PO CAPS: 500.0000 mg | ORAL_CAPSULE | Freq: Three times a day (TID) | ORAL | 0 refills | Status: AC

## 2020-01-02 NOTE — Progress Notes (Signed)
Writer walked pt around the nurses station 3X. No c/o dizzness.

## 2020-01-02 NOTE — Discharge Summary (Signed)
Physician Discharge Summary  [name] [name] ZDG:[id number] DOB: 23-Kevonna-1952 DOA: 12/29/2019  PCP: Jearld Fenton, NP  Admit date: 12/29/2019 Discharge date: 01/03/2020  Discharge disposition: Home   Recommendations for Outpatient Follow-Up:   Follow-up with PCP in 1 week   Discharge Diagnosis:   Principal Problem:   Sepsis secondary to UTI [name]) Active Problems:   HLD (hyperlipidemia)   Essential hypertension   Anxiety and depression    Discharge Condition: Stable.  Diet recommendation:  Diet Order            Diet - low sodium heart healthy                   Code Status: Prior     Hospital Course:   [name] is a 69 year old woman with medical history significant for hypertension, hyperlipidemia, anxiety, depression, who presented to the hospital with generalized weakness, poor oral intake and fever.  She also reported having diarrhea for about a week or so.  She was found to have sepsis secondary to UTI.  She was treated with IV fluids and empiric IV antibiotics.  Urine culture showed E. Coli.  She also had hypotension that has resolved with IV fluids.  She had hypokalemia that was treated.  Stool for C. difficile toxin and GI panel was negative.  Her condition is slowly improved and she was deemed stable for discharge to home.  She was able to ambulate in the hallway without any symptoms.      Discharge Exam:    Vitals:   01/01/20 1546 01/01/20 1957 01/01/20 2354 01/02/20 0749  BP: 119/68 (!) 147/69 (!) 149/88 136/66  Pulse: (!) 53 60 (!) 57 (!) 49  Resp: 17 17 17 16   Temp: 98.2 F (36.8 C) 98.8 F (37.1 C) 99.1 F (37.3 C) 97.9 F (36.6 C)  TempSrc: Oral Oral Oral Oral  SpO2: 100% 100% 100% 100%  Weight:      Height:         GEN: NAD SKIN: No rash EYES: EOMI ENT: MMM CV: RRR PULM: CTA B ABD: soft, ND, NT, +BS CNS: AAO x 3, non focal EXT: No edema or tenderness   The results of significant diagnostics from this hospitalization  (including imaging, microbiology, ancillary and laboratory) are listed below for reference.     Procedures and Diagnostic Studies:   DG Chest 1 View  Result Date: 12/29/2019 CLINICAL DATA:  Fever for 2 weeks, body aches, headaches EXAM: CHEST  1 VIEW COMPARISON:  None. FINDINGS: Single frontal view of the chest demonstrates an unremarkable cardiac silhouette. No airspace disease, effusion, or pneumothorax. No acute bony abnormalities. IMPRESSION: 1. No acute intrathoracic process. Electronically Signed   By: Randa Ngo M.D.   On: 12/29/2019 17:19   US RENAL  Result Date: 12/30/2019 CLINICAL DATA:  Pyelonephritis. EXAM: RENAL / URINARY TRACT ULTRASOUND COMPLETE COMPARISON:  No prior. FINDINGS: Right Kidney: Renal measurements: 10.6 x 4.0 x 5.7 cm = volume: 126.0 mL. Echogenicity within normal limits. No mass or hydronephrosis visualized. Left Kidney: Renal measurements: 12.8 x 5.6 x 5.3 cm = volume: 197 mL. Echogenicity within normal limits. No mass. Mild caliectasis cannot be excluded. Bladder: Appears normal for degree of bladder distention. Pleural chest noted. Other: None. IMPRESSION: Mild left caliectasis cannot be excluded. No prominent hydronephrosis. Nondistended. Bilateral ureteral jets noted. Electronically Signed   By: [place]   On: 12/30/2019 11:15     Labs:   Basic Metabolic Panel: Recent  Labs  Lab 12/29/19 1700 12/29/19 1700 12/30/19 0441 12/30/19 0441 12/31/19 0904 12/31/19 0904 01/01/20 0405 01/02/20 0813  NA 133*  --  132*  --  138  --  140  --   K 3.5   < > 3.3*   < > 3.6   < > 3.4* 3.7  CL 96*  --  97*  --  108  --  108  --   CO2 25  --  26  --  23  --  22  --   GLUCOSE 125*  --  113*  --  96  --  87  --   BUN 21  --  22  --  8  --  7*  --   CREATININE 0.90  --  0.78  --  0.60  --  0.56  --   CALCIUM 9.0  --  8.6*  --  8.7*  --  8.5*  --   MG  --   --   --   --   --   --  1.8 2.0   < > = values in this interval not displayed.   GFR Estimated  Creatinine Clearance: 52.4 mL/min (by C-G formula based on SCr of 0.56 mg/dL). Liver Function Tests: Recent Labs  Lab 12/29/19 1700  AST 18  ALT 12  ALKPHOS 59  BILITOT 1.0  PROT 7.6  ALBUMIN 3.7   No results for input(s): LIPASE, AMYLASE in the last 168 hours. No results for input(s): AMMONIA in the last 168 hours. Coagulation profile No results for input(s): INR, PROTIME in the last 168 hours.  CBC: Recent Labs  Lab 12/29/19 1700 12/30/19 0441 12/31/19 0904  WBC 19.8* 17.6* 8.3  NEUTROABS 16.8*  --  5.6  HGB 12.6 11.3* 10.1*  HCT 37.2 33.9* 30.9*  MCV 96.6 97.7 99.0  PLT 205 190 159   Cardiac Enzymes: No results for input(s): CKTOTAL, CKMB, CKMBINDEX, TROPONINI in the last 168 hours. BNP: Invalid input(s): POCBNP CBG: No results for input(s): GLUCAP in the last 168 hours. D-Dimer No results for input(s): DDIMER in the last 72 hours. Hgb A1c No results for input(s): HGBA1C in the last 72 hours. Lipid Profile No results for input(s): CHOL, HDL, LDLCALC, TRIG, CHOLHDL, LDLDIRECT in the last 72 hours. Thyroid function studies No results for input(s): TSH, T4TOTAL, T3FREE, THYROIDAB in the last 72 hours.  Invalid input(s): FREET3 Anemia work up No results for input(s): VITAMINB12, FOLATE, FERRITIN, TIBC, IRON, RETICCTPCT in the last 72 hours. Microbiology Recent Results (from the past 240 hour(s))  Blood culture (routine x 2)     Status: None   Collection Time: 12/29/19  5:00 PM   Specimen: BLOOD  Result Value Ref Range Status   Specimen Description BLOOD RIGHT ANTECUBITAL  Final   Special Requests   Final    BOTTLES DRAWN AEROBIC AND ANAEROBIC Blood Culture adequate volume   Culture   Final    NO GROWTH 5 DAYS Performed at [facility], [address] [place], [place] [address]    Report Status 01/03/2020 FINAL  Final  Blood culture (routine x 2)     Status: None   Collection Time: 12/29/19  5:00 PM   Specimen: BLOOD  Result Value Ref Range  Status   Specimen Description BLOOD LEFT ANTECUBITAL  Final   Special Requests   Final    BOTTLES DRAWN AEROBIC AND ANAEROBIC Blood Culture adequate volume   Culture   Final  NO GROWTH 5 DAYS Performed at [facility], [address] [place], [place] [address]    Report Status 01/03/2020 FINAL  Final  SARS Coronavirus 2 by RT PCR (hospital order, performed in [facility] hospital lab) Nasopharyngeal Nasopharyngeal Swab     Status: None   Collection Time: 12/29/19  5:00 PM   Specimen: Nasopharyngeal Swab  Result Value Ref Range Status   SARS Coronavirus 2 NEGATIVE NEGATIVE Final    Comment: (NOTE) SARS-CoV-2 target nucleic acids are NOT DETECTED.  The SARS-CoV-2 RNA is generally detectable in upper and lower respiratory specimens during the acute phase of infection. The lowest concentration of SARS-CoV-2 viral copies this assay can detect is 250 copies / mL. A negative result does not preclude SARS-CoV-2 infection and should not be used as the sole basis for treatment or other patient management decisions.  A negative result may occur with improper specimen collection / handling, submission of specimen other than nasopharyngeal swab, presence of viral mutation(s) within the areas targeted by this assay, and inadequate number of viral copies (<250 copies / mL). A negative result must be combined with clinical observations, patient history, and epidemiological information.  Fact Sheet for Patients:   StrictlyIdeas.no  Fact Sheet for Healthcare Providers: BankingDealers.co.za  This test is not yet approved or  cleared by the [place] FDA and has been authorized for detection and/or diagnosis of SARS-CoV-2 by FDA under an Emergency Use Authorization (EUA).  This EUA will remain in effect (meaning this test can be used) for the duration of the COVID-19 declaration under Section 564(b)(1) of the Act, 21 U.S.C. section  360bbb-3(b)(1), unless the authorization is terminated or revoked sooner.  Performed at [facility], [address] [place], [place] [address]   Urine culture     Status: Abnormal   Collection Time: 12/29/19  5:00 PM   Specimen: Urine, Random  Result Value Ref Range Status   Specimen Description   Final    URINE, RANDOM Performed at [facility], [address] [place], [place] [address]    Special Requests   Final    NONE Performed at [name], [place], [place] [address]    Culture >=100,000 COLONIES/mL ESCHERICHIA COLI (A)  Final   Report Status 12/31/2019 FINAL  Final   Organism ID, Bacteria ESCHERICHIA COLI (A)  Final      Susceptibility   Escherichia coli - MIC*    AMPICILLIN >=32 RESISTANT Resistant     CEFAZOLIN <=4 SENSITIVE Sensitive     CEFTRIAXONE <=0.25 SENSITIVE Sensitive     CIPROFLOXACIN <=0.25 SENSITIVE Sensitive     GENTAMICIN <=1 SENSITIVE Sensitive     IMIPENEM <=0.25 SENSITIVE Sensitive     NITROFURANTOIN <=16 SENSITIVE Sensitive     TRIMETH/SULFA >=320 RESISTANT Resistant     AMPICILLIN/SULBACTAM >=32 RESISTANT Resistant     PIP/TAZO 64 INTERMEDIATE Intermediate     * >=100,000 COLONIES/mL ESCHERICHIA COLI  C Difficile Quick Screen w PCR reflex     Status: None   Collection Time: 12/31/19  3:51 PM   Specimen: STOOL  Result Value Ref Range Status   C Diff antigen NEGATIVE NEGATIVE Final   C Diff toxin NEGATIVE NEGATIVE Final   C Diff interpretation No C. difficile detected.  Final    Comment: Performed at [name], [address], [place],  [address]  Gastrointestinal Panel by PCR , Stool     Status: None   Collection Time: 12/31/19  3:51 PM   Specimen: Stool  Result Value Ref Range Status   Campylobacter species NOT DETECTED NOT DETECTED Final   Plesimonas shigelloides NOT DETECTED NOT DETECTED Final   Salmonella species NOT DETECTED NOT DETECTED Final   Yersinia  enterocolitica NOT DETECTED NOT DETECTED Final   Vibrio species NOT DETECTED NOT DETECTED Final   Vibrio cholerae NOT DETECTED NOT DETECTED Final   Enteroaggregative E coli (EAEC) NOT DETECTED NOT DETECTED Final   Enteropathogenic E coli (EPEC) NOT DETECTED NOT DETECTED Final   Enterotoxigenic E coli (ETEC) NOT DETECTED NOT DETECTED Final   Shiga like toxin producing E coli (STEC) NOT DETECTED NOT DETECTED Final   Shigella/Enteroinvasive E coli (EIEC) NOT DETECTED NOT DETECTED Final   Cryptosporidium NOT DETECTED NOT DETECTED Final   Cyclospora cayetanensis NOT DETECTED NOT DETECTED Final   Entamoeba histolytica NOT DETECTED NOT DETECTED Final   Giardia lamblia NOT DETECTED NOT DETECTED Final   Adenovirus F40/41 NOT DETECTED NOT DETECTED Final   Astrovirus NOT DETECTED NOT DETECTED Final   Norovirus GI/GII NOT DETECTED NOT DETECTED Final   Rotavirus A NOT DETECTED NOT DETECTED Final   Sapovirus (I, II, IV, and V) NOT DETECTED NOT DETECTED Final    Comment: Performed at [facility], [address], [place], [place] [address]     Discharge Instructions:   Discharge Instructions    Diet - low sodium heart healthy   Complete by: As directed    Increase activity slowly   Complete by: As directed      Allergies as of 01/02/2020      Reactions   Erythromycin Other (See Comments)   REACTION: GI upset   Penicillins Nausea And Vomiting      Medication List    STOP taking these medications   ALPRAZolam 0.25 MG tablet Commonly known as: XANAX     TAKE these medications   cephALEXin 500 MG capsule Commonly known as: KEFLEX Take 1 capsule (500 mg total) by mouth every 8 (eight) hours for 3 days.   hydrochlorothiazide 25 MG tablet Commonly known as: HYDRODIURIL Take 1 tablet (25 mg total) by mouth daily. Notes to patient: Not taken during this hospital stay   losartan 100 MG tablet Commonly known as: COZAAR Take 1 tablet (100 mg total) by mouth daily. Notes to  patient: Not taken during this hospital stay   rosuvastatin 10 MG tablet Commonly known as: CRESTOR Take 1 tablet (10 mg total) by mouth daily.   sertraline 50 MG tablet Commonly known as: ZOLOFT Take 0.5 tablets (25 mg total) by mouth daily.   traMADol 50 MG tablet Commonly known as: ULTRAM TAKE 1 TABLET (50 MG TOTAL) BY MOUTH EVERY 8 (EIGHT) HOURS AS NEEDED. FOR PAIN What changed:   reasons to take this  additional instructions Notes to patient: Not taken during this hospital stay         Time coordinating discharge: 32 minutes  Signed:  Jennye Boroughs  [facility] 01/03/2020, 4:45 PM   Pager on [email or web address]. If 7PM-7AM, please contact night-coverage at [email or web address]

## 2020-01-02 NOTE — Telephone Encounter (Signed)
1st attempt- Left message on voicemail to return call- need to complete TCM and schedule hospital follow up visit.

## 2020-01-02 NOTE — Plan of Care (Signed)

## 2020-01-02 NOTE — Plan of Care (Signed)
  Problem: Education: Goal: Knowledge of General Education information will improve Description: Including pain rating scale, medication(s)/side effects and non-pharmacologic comfort measures 01/02/2020 1228 by Ferrel Logan, RN Outcome: Adequate for Discharge 01/02/2020 1207 by Ferrel Logan, RN Outcome: Progressing   Problem: Health Behavior/Discharge Planning: Goal: Ability to manage health-related needs will improve 01/02/2020 1228 by Ferrel Logan, RN Outcome: Adequate for Discharge 01/02/2020 1207 by Ferrel Logan, RN Outcome: Progressing   Problem: Clinical Measurements: Goal: Ability to maintain clinical measurements within normal limits will improve 01/02/2020 1228 by Ferrel Logan, RN Outcome: Adequate for Discharge 01/02/2020 1207 by Ferrel Logan, RN Outcome: Progressing Goal: Will remain free from infection 01/02/2020 1228 by Ferrel Logan, RN Outcome: Adequate for Discharge 01/02/2020 1207 by Ferrel Logan, RN Outcome: Progressing Goal: Diagnostic test results will improve 01/02/2020 1228 by Ferrel Logan, RN Outcome: Adequate for Discharge 01/02/2020 1207 by Ferrel Logan, RN Outcome: Progressing Goal: Respiratory complications will improve 01/02/2020 1228 by Ferrel Logan, RN Outcome: Adequate for Discharge 01/02/2020 1207 by Ferrel Logan, RN Outcome: Progressing Goal: Cardiovascular complication will be avoided 01/02/2020 1228 by Ferrel Logan, RN Outcome: Adequate for Discharge 01/02/2020 1207 by Ferrel Logan, RN Outcome: Progressing   Problem: Activity: Goal: Risk for activity intolerance will decrease 01/02/2020 1228 by Ferrel Logan, RN Outcome: Adequate for Discharge 01/02/2020 1207 by Ferrel Logan, RN Outcome: Progressing   Problem: Nutrition: Goal: Adequate nutrition will be maintained 01/02/2020 1228 by Ferrel Logan, RN Outcome: Adequate for Discharge 01/02/2020 1207 by Ferrel Logan, RN Outcome: Progressing   Problem:  Coping: Goal: Level of anxiety will decrease 01/02/2020 1228 by Ferrel Logan, RN Outcome: Adequate for Discharge 01/02/2020 1207 by Ferrel Logan, RN Outcome: Progressing   Problem: Elimination: Goal: Will not experience complications related to bowel motility 01/02/2020 1228 by Ferrel Logan, RN Outcome: Adequate for Discharge 01/02/2020 1207 by Ferrel Logan, RN Outcome: Progressing Goal: Will not experience complications related to urinary retention 01/02/2020 1228 by Ferrel Logan, RN Outcome: Adequate for Discharge 01/02/2020 1207 by Ferrel Logan, RN Outcome: Progressing   Problem: Pain Managment: Goal: General experience of comfort will improve 01/02/2020 1228 by Ferrel Logan, RN Outcome: Adequate for Discharge 01/02/2020 1207 by Ferrel Logan, RN Outcome: Progressing   Problem: Safety: Goal: Ability to remain free from injury will improve 01/02/2020 1228 by Ferrel Logan, RN Outcome: Adequate for Discharge 01/02/2020 1207 by Ferrel Logan, RN Outcome: Progressing   Problem: Skin Integrity: Goal: Risk for impaired skin integrity will decrease 01/02/2020 1228 by Ferrel Logan, RN Outcome: Adequate for Discharge 01/02/2020 1207 by Ferrel Logan, RN Outcome: Progressing

## 2020-01-02 NOTE — Care Management Important Message (Signed)
Important Message  Patient Details  Name: [name] [name] MRN: [id number] Date of Birth: Nov 29, 1950   Medicare Important Message Given:  Yes     Dannette Barbara 01/02/2020, 11:30 AM

## 2020-01-03 LAB — CULTURE, BLOOD (ROUTINE X 2)
Culture: NO GROWTH
Culture: NO GROWTH
Special Requests: ADEQUATE
Special Requests: ADEQUATE

## 2020-01-05 NOTE — Telephone Encounter (Signed)
Transition Care Management Follow-up Telephone Call  Date of discharge and from where: 01/02/2020, [facility]  How have you been since you were released from the hospital? Patient states that she is feeling better just weakness noted.  Any questions or concerns? No  Items Reviewed:  Did the pt receive and understand the discharge instructions provided? No   Medications obtained and verified? No   Any new allergies since your discharge? Yes   Dietary orders reviewed? No  Do you have support at home? No   Functional Questionnaire: (I = Independent and D = Dependent) ADLs: I   Bathing/Dressing- I  Meal Prep- I  Eating- I  Maintaining continence- I  Transferring/Ambulation- I  Managing Meds- I  Follow up appointments reviewed:   PCP Hospital f/u appt confirmed? Yes  Scheduled to see Webb Silversmith, NP on 01/08/2020 @ 11:45 am.  [facility] f/u appt confirmed? N/A   Are transportation arrangements needed? No   If their condition worsens, is the pt aware to call PCP or go to the Emergency Dept.? Yes  Was the patient provided with contact information for the PCP's office or ED? Yes  Was to pt encouraged to call back with questions or concerns? Yes

## 2020-01-05 NOTE — Telephone Encounter (Signed)
noted 

## 2020-01-08 ENCOUNTER — Encounter: Payer: Self-pay | Admitting: Internal Medicine

## 2020-01-08 ENCOUNTER — Other Ambulatory Visit: Payer: Self-pay

## 2020-01-08 ENCOUNTER — Ambulatory Visit (INDEPENDENT_AMBULATORY_CARE_PROVIDER_SITE_OTHER): Payer: Medicare HMO | Admitting: Internal Medicine

## 2020-01-08 VITALS — BP 110/60 | HR 90 | Temp 98.4°F | Ht 59.0 in | Wt 116.0 lb

## 2020-01-08 DIAGNOSIS — N10 Acute tubulo-interstitial nephritis: Secondary | ICD-10-CM | POA: Diagnosis not present

## 2020-01-08 DIAGNOSIS — R531 Weakness: Secondary | ICD-10-CM

## 2020-01-08 DIAGNOSIS — R5383 Other fatigue: Secondary | ICD-10-CM | POA: Diagnosis not present

## 2020-01-08 DIAGNOSIS — A4151 Sepsis due to Escherichia coli [E. coli]: Secondary | ICD-10-CM | POA: Diagnosis not present

## 2020-01-08 LAB — CBC
HCT: 41.4 % (ref 36.0–46.0)
Hemoglobin: 13.9 g/dL (ref 12.0–15.0)
MCHC: 33.6 g/dL (ref 30.0–36.0)
MCV: 97.3 fl (ref 78.0–100.0)
Platelets: 521 10*3/uL — ABNORMAL HIGH (ref 150.0–400.0)
RBC: 4.25 Mil/uL (ref 3.87–5.11)
RDW: 13.8 % (ref 11.5–15.5)
WBC: 10.1 10*3/uL (ref 4.0–10.5)

## 2020-01-08 LAB — POCT URINALYSIS DIPSTICK
Bilirubin, UA: NEGATIVE
Blood, UA: NEGATIVE
Glucose, UA: NEGATIVE
Ketones, UA: NEGATIVE
Leukocytes, UA: NEGATIVE
Nitrite, UA: NEGATIVE
Protein, UA: NEGATIVE
Spec Grav, UA: 1.015 (ref 1.010–1.025)
Urobilinogen, UA: 0.2 E.U./dL
pH, UA: 7.5 (ref 5.0–8.0)

## 2020-01-08 LAB — COMPREHENSIVE METABOLIC PANEL
ALT: 24 U/L (ref 0–35)
AST: 19 U/L (ref 0–37)
Albumin: 4.2 g/dL (ref 3.5–5.2)
Alkaline Phosphatase: 74 U/L (ref 39–117)
BUN: 17 mg/dL (ref 6–23)
CO2: 33 mEq/L — ABNORMAL HIGH (ref 19–32)
Calcium: 10.6 mg/dL — ABNORMAL HIGH (ref 8.4–10.5)
Chloride: 95 mEq/L — ABNORMAL LOW (ref 96–112)
Creatinine, Ser: 0.92 mg/dL (ref 0.40–1.20)
GFR: 60.8 mL/min (ref >60.00)
Glucose, Bld: 69 mg/dL — ABNORMAL LOW (ref 70–99)
Potassium: 4.1 mEq/L (ref 3.5–5.1)
Sodium: 136 mEq/L (ref 135–145)
Total Bilirubin: 0.4 mg/dL (ref 0.2–1.2)
Total Protein: 7.5 g/dL (ref 6.0–8.3)

## 2020-01-08 LAB — TSH: TSH: 3.87 u[IU]/mL (ref 0.35–4.50)

## 2020-01-08 LAB — VITAMIN D 25 HYDROXY (VIT D DEFICIENCY, FRACTURES): VITD: 45.31 ng/mL (ref 30.00–100.00)

## 2020-01-08 LAB — VITAMIN B12: Vitamin B-12: >1526 pg/mL — ABNORMAL HIGH (ref 211–911)

## 2020-01-08 NOTE — Progress Notes (Signed)
Subjective:    Patient ID: [name] [name], female    DOB: [birth year], 69 y.o.   MRN: [id number]  HPI  Pt presents to the clinic today for TCM hospital follow up. She went to the ER 8/16 with c/o fever, weakness, decreased oral intake and diarrhea. Urinalysis was abnormal, urine culture grew [facility]. Covid was negative. Chest xray was negative. Renal US was negative for acute findings. She was diagnosed with sepsis due to acute pyelonephritis, treated with IV fluids, abx and electrolytes. She was discharged 8/21, advised to follow up with her PCP. Since discharge, she reports she stills feels weak and has poor appetite, but her loose stools and fevers have improved. She never had any urinary symptoms.  Review of Systems  Past Medical History:  Diagnosis Date  . Allergy   . Anxiety   . Hypertension     Current Outpatient Medications  Medication Sig Dispense Refill  . hydrochlorothiazide (HYDRODIURIL) 25 MG tablet Take 1 tablet (25 mg total) by mouth daily. 90 tablet 1  . losartan (COZAAR) 100 MG tablet Take 1 tablet (100 mg total) by mouth daily. 90 tablet 1  . rosuvastatin (CRESTOR) 10 MG tablet Take 1 tablet (10 mg total) by mouth daily. 90 tablet 2  . sertraline (ZOLOFT) 50 MG tablet Take 0.5 tablets (25 mg total) by mouth daily. 45 tablet 1  . traMADol (ULTRAM) 50 MG tablet TAKE 1 TABLET (50 MG TOTAL) BY MOUTH EVERY 8 (EIGHT) HOURS AS NEEDED. FOR PAIN (Patient taking differently: Take 50 mg by mouth every 8 (eight) hours as needed for moderate pain. ) 30 tablet 0   No current facility-administered medications for this visit.    Allergies  Allergen Reactions  . Erythromycin Other (See Comments)    REACTION: GI upset  . Penicillins Nausea And Vomiting    Family History  Problem Relation Age of Onset  . Diabetes Mother   . Stroke Mother   . Other Mother        colon sugery  . Polymyalgia rheumatica Mother   . Heart disease Father   . Diabetes Father   . Colon cancer Neg Hx    . Cancer Neg Hx     Social History   Socioeconomic History  . Marital status: Married    Spouse name: Not on file  . Number of children: 2  . Years of education: Not on file  . Highest education level: Not on file  Occupational History  . Occupation: self employed    Employer: OTHER  Tobacco Use  . Smoking status: Current Every Day Smoker    Packs/day: 0.75    Types: Cigarettes  . Smokeless tobacco: Never Used  Vaping Use  . Vaping Use: Never used  Substance and Sexual Activity  . Alcohol use: Yes    Alcohol/week: 10.0 standard drinks    Types: 10 Cans of beer per week  . Drug use: No  . Sexual activity: Not on file  Other Topics Concern  . Not on file  Social History Narrative  . Not on file   Social Determinants of Health   Financial Resource Strain:   . Difficulty of Paying Living Expenses: Not on file  Food Insecurity:   . Worried About Charity fundraiser in the Last Year: Not on file  . Ran Out of Food in the Last Year: Not on file  Transportation Needs:   . Lack of Transportation (Medical): Not on file  . Lack of Transportation (  Non-Medical): Not on file  Physical Activity:   . Days of Exercise per Week: Not on file  . Minutes of Exercise per Session: Not on file  Stress:   . Feeling of Stress : Not on file  Social Connections:   . Frequency of Communication with Friends and Family: Not on file  . Frequency of Social Gatherings with Friends and Family: Not on file  . Attends Religious Services: Not on file  . Active Member of Clubs or Organizations: Not on file  . Attends Archivist Meetings: Not on file  . Marital Status: Not on file  Intimate Partner Violence:   . Fear of Current or Ex-Partner: Not on file  . Emotionally Abused: Not on file  . Physically Abused: Not on file  . Sexually Abused: Not on file     Constitutional: Pt reports fatigue. Denies fever, malaise, fatigue, headache or abrupt weight changes.  Respiratory: Denies  difficulty breathing, shortness of breath, cough or sputum production.   Cardiovascular: Denies chest pain, chest tightness, palpitations or swelling in the hands or feet.  Gastrointestinal: Pt reports poor appetite. Denies abdominal pain, bloating, constipation, diarrhea or blood in the stool.  GU: Denies urgency, frequency, pain with urination, burning sensation, blood in urine, odor or discharge. Musculoskeletal: Pt reports generalized weakness. Denies decrease in range of motion, difficulty with gait, muscle pain or joint pain and swelling.   No other specific complaints in a complete review of systems (except as listed in HPI above).     Objective:   Physical Exam   BP 110/60   Pulse 90   Temp 98.4 F (36.9 C)   Ht 4\' 11"  (1.499 m)   Wt 116 lb (52.6 kg)   SpO2 98%   BMI 23.43 kg/m  Wt Readings from Last 3 Encounters:  01/08/20 116 lb (52.6 kg)  12/29/19 125 lb (56.7 kg)  12/12/19 125 lb (56.7 kg)    General: Appears her stated age, well developed, well nourished in NAD. Cardiovascular: Normal rate and rhythm. S1,S2 noted.  No murmur, rubs or gallops noted.  Pulmonary/Chest: Normal effort and positive vesicular breath sounds. No respiratory distress. No wheezes, rales or ronchi noted.  Abdomen: Soft and nontender. Normal bowel sounds. No distention or masses noted. No CVA tenderness noted. Musculoskeletal:No difficulty with gait.  Neurological: Alert and oriented.    BMET    Component Value Date/Time   NA 136 01/08/2020 1200   K 4.1 01/08/2020 1200   CL 95 (L) 01/08/2020 1200   CO2 33 (H) 01/08/2020 1200   GLUCOSE 69 (L) 01/08/2020 1200   BUN 17 01/08/2020 1200   CREATININE 0.92 01/08/2020 1200   CALCIUM 10.6 (H) 01/08/2020 1200   GFRNONAA >60 01/01/2020 0405   GFRAA >60 01/01/2020 0405    Lipid Panel     Component Value Date/Time   CHOL 187 05/13/2019 1228   TRIG 169.0 (H) 05/13/2019 1228   HDL 41.90 05/13/2019 1228   CHOLHDL 4 05/13/2019 1228   VLDL  33.8 05/13/2019 1228   LDLCALC 111 (H) 05/13/2019 1228    CBC    Component Value Date/Time   WBC 10.1 01/08/2020 1200   RBC 4.25 01/08/2020 1200   HGB 13.9 01/08/2020 1200   HCT 41.4 01/08/2020 1200   PLT 521.0 (H) 01/08/2020 1200   MCV 97.3 01/08/2020 1200   MCH 32.4 12/31/2019 0904   MCHC 33.6 01/08/2020 1200   RDW 13.8 01/08/2020 1200   LYMPHSABS 1.5 12/31/2019 0904  MONOABS 1.0 12/31/2019 0904   EOSABS 0.1 12/31/2019 0904   BASOSABS 0.0 12/31/2019 0904    Hgb A1C Lab Results  Component Value Date   HGBA1C 5.7 04/13/2015           Assessment & Plan:   [facility] Follow Up for Sepsis, Acute Pyelonephritis, Generalized Weakness, Fatigue:  Hospital notes, labs and imaging reviewed Clinically improving Will repeat CBC, CMET, UA and culture Encouraged her to drink plenty of fluids Will check TSH, Vit D, B12 Encouraged her to rest   Will follow up after labs, return precautions discussed Webb Silversmith, NP This visit occurred during the SARS-CoV-2 public health emergency.  Safety protocols were in place, including screening questions prior to the visit, additional usage of staff PPE, and extensive cleaning of exam room while observing appropriate contact time as indicated for disinfecting solutions.

## 2020-01-08 NOTE — Patient Instructions (Signed)
Sepsis, Diagnosis, Adult Sepsis is a serious bodily reaction to an infection. The infection that triggers sepsis may be from a bacteria, virus, or fungus. Sepsis can result from an infection in any part of your body. Infections that commonly lead to sepsis include skin, lung, and urinary tract infections. Sepsis is a medical emergency that must be treated right away in a hospital. In severe cases, it can lead to septic shock. Septic shock can weaken your heart and cause your blood pressure to drop. This can cause your central nervous system and your body's organs to stop working. What are the causes? This condition is caused by a severe reaction to infections from bacteria, viruses, or fungus. The germs that most often lead to sepsis include:  Escherichia coli (E. coli) bacteria.  Staphylococcus aureus (staph) bacteria.  Some types of Streptococcus bacteria. The most common infections affect these organs:  The lung (pneumonia).  The kidneys or bladder (urinary tract infection).  The skin (cellulitis).  The bowel, gallbladder, or pancreas. What increases the risk? You are more likely to develop this condition if:  Your body's disease-fighting system (immune system) is weakened.  You are age 65 or older.  You are female.  You had surgery or you have been hospitalized.  You have these devices inserted into your body: ? A small, thin tube (catheter). ? IV line. ? Breathing tube. ? Drainage tube.  You are not getting enough nutrients from food (malnourished).  You have a long-term (chronic) disease, such as cancer, lung disease, kidney disease, or diabetes.  You are African American. What are the signs or symptoms? Symptoms of this condition may include:  Fever.  Chills or feeling very cold.  Confusion or anxiety.  Fatigue.  Muscle aches.  Shortness of breath.  Nausea and vomiting.  Urinating much less than usual.  Fast heart rate (tachycardia).  Rapid  breathing (hyperventilation).  Changes in skin color. Your skin may look blotchy, pale, or blue.  Cool, clammy, or sweaty skin.  Skin rash. Other symptoms depend on the source of your infection. How is this diagnosed? This condition is diagnosed based on:  Your symptoms.  Your medical history.  A physical exam. Other tests may also be done to find out the cause of the infection and how severe the sepsis is. These tests may include:  Blood tests.  Urine tests.  Swabs from other areas of your body that may have an infection. These samples may be tested (cultured) to find out what type of bacteria is causing the infection.  Chest X-ray to check for pneumonia. Other imaging tests, such as a CT scan, may also be done.  Lumbar puncture. This removes a small amount of the fluid that surrounds your brain and spinal cord. The fluid is then examined for infection. How is this treated? This condition must be treated in a hospital. Based on the cause of your infection, you may be given an antibiotic, antiviral, or antifungal medicine. You may also receive:  Fluids through an IV.  Oxygen and breathing assistance.  Medicines to increase your blood pressure.  Kidney dialysis. This process cleans your blood if your kidneys have failed.  Surgery to remove infected tissue.  Blood transfusion if needed.  Medicine to prevent blood clots.  Nutrients to correct imbalances in basic body function (metabolism). You may: ? Receive important salts and minerals (electrolytes) through an IV. ? Have your blood sugar level adjusted. Follow these instructions at home: Medicines   Take over-the-counter and   prescription medicines only as told by your health care provider.  If you were prescribed an antibiotic, antiviral, or antifungal medicine, take it as told by your health care provider. Do not stop taking the medicine even if you start to feel better. General instructions  If you have a  catheter or other indwelling device, ask to have it removed as soon as possible.  Keep all follow-up visits as told by your health care provider. This is important. Contact a health care provider if:  You do not feel like you are getting better or regaining strength.  You are having trouble coping with your recovery.  You frequently feel tired.  You feel worse or do not seem to get better after surgery.  You think you may have an infection after surgery. Get help right away if:  You have any symptoms of sepsis.  You have difficulty breathing.  You have a rapid or skipping heartbeat.  You become confused or disoriented.  You have a high fever.  Your skin becomes blotchy, pale, or blue.  You have an infection that is getting worse or not getting better. These symptoms may represent a serious problem that is an emergency. Do not wait to see if the symptoms will go away. Get medical help right away. Call your local emergency services (911 in the [place] Do not drive yourself to the hospital. Summary  Sepsis is a medical emergency that requires immediate treatment in a hospital.  This condition is caused by a severe reaction to infections from bacteria, viruses, or fungus.  Based on the cause of your infection, you may be given an antibiotic, antiviral, or antifungal medicine.  Treatment may also include IV fluids, breathing assistance, and kidney dialysis. This information is not intended to replace advice given to you by your health care provider. Make sure you discuss any questions you have with your health care provider. Document Revised: 12/07/2017 Document Reviewed: 12/07/2017 Elsevier Patient Education  2020 Elsevier Inc.  

## 2020-01-09 LAB — URINE CULTURE
MICRO NUMBER:: 10876385
SPECIMEN QUALITY:: ADEQUATE

## 2020-03-04 ENCOUNTER — Other Ambulatory Visit: Payer: Self-pay | Admitting: Internal Medicine

## 2020-04-06 ENCOUNTER — Other Ambulatory Visit: Payer: Self-pay | Admitting: Internal Medicine

## 2020-06-14 ENCOUNTER — Telehealth: Payer: Self-pay

## 2020-06-14 DIAGNOSIS — N390 Urinary tract infection, site not specified: Secondary | ICD-10-CM | POA: Diagnosis not present

## 2020-06-14 DIAGNOSIS — R1032 Left lower quadrant pain: Secondary | ICD-10-CM | POA: Diagnosis not present

## 2020-06-14 DIAGNOSIS — R509 Fever, unspecified: Secondary | ICD-10-CM | POA: Diagnosis not present

## 2020-06-14 NOTE — Telephone Encounter (Signed)
Brought to this nurse's attention that pt was scheduled for OV for tomorrow for fever, stomach pain, and knot on L side Tried to contact pt but no answer and VM is full. Contacted pt's husband who was with pt. [name] reported pt went to [facility] and was diagnosed with UTI. She was prescribed abx. He reports pt has a hx of sepsis with UTI so she did not want to wait until tomorrow. He asked that apt for tomorrow be cancelled. Advised if symptoms do not improve to contact this office. [name] verbalized understanding.   Cancelled apt for tomorrow.

## 2020-06-15 ENCOUNTER — Ambulatory Visit: Payer: Medicare HMO | Admitting: Internal Medicine

## 2020-06-15 NOTE — Telephone Encounter (Signed)
noted 

## 2020-07-02 ENCOUNTER — Encounter: Payer: Self-pay | Admitting: Gastroenterology

## 2020-07-14 ENCOUNTER — Telehealth: Payer: Self-pay | Admitting: Internal Medicine

## 2020-07-14 NOTE — Telephone Encounter (Signed)
Hard to work in a [employer]

## 2020-07-14 NOTE — Telephone Encounter (Signed)
Patient is wanting to do a physical with you . I see that you are booked till the end of April. Do you have any suggestions as to when we can schedule her for an appt. EM

## 2020-07-21 ENCOUNTER — Other Ambulatory Visit: Payer: Self-pay | Admitting: Internal Medicine

## 2020-07-21 NOTE — Telephone Encounter (Signed)
Last filled 11/04/2019... please advise

## 2020-07-22 NOTE — Telephone Encounter (Signed)
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

## 2020-07-22 NOTE — Telephone Encounter (Signed)
How do we add that back, Tramadol refilled

## 2020-07-22 NOTE — Telephone Encounter (Signed)
I don't see chronic condition on problem list to support this. What does she take this for?

## 2020-07-26 IMAGING — MR MR HEAD W/O CM
11 series · 48 of 48 positions shown · non-contrast
Comparison: No pertinent prior studies available for comparison.

CLINICAL DATA: Memory loss. Additional provided: Short-term memory
loss for 1 year.

EXAM:
MRI HEAD WITHOUT CONTRAST
TECHNIQUE: Multiplanar, multiecho pulse sequences of the brain and surrounding
structures were obtained without intravenous contrast.

[Series 5: ax dwi_tracew · axial · 3.0mm · 0.60mm/px · z∈[-60,+93]mm · 4 of 48 slices shown]
[im 1/48]
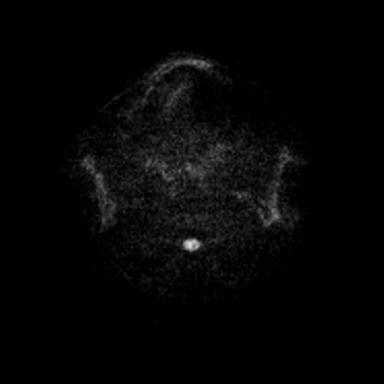
[im 16/48]
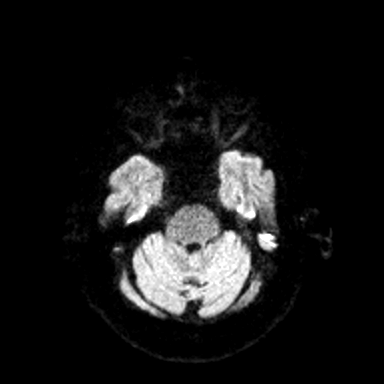
[im 32/48]
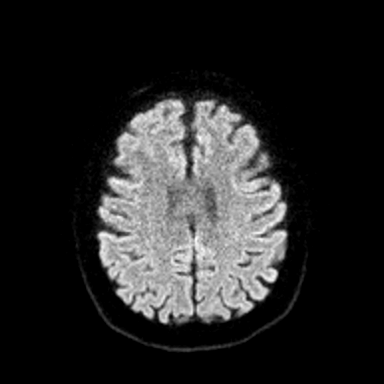
[im 48/48]
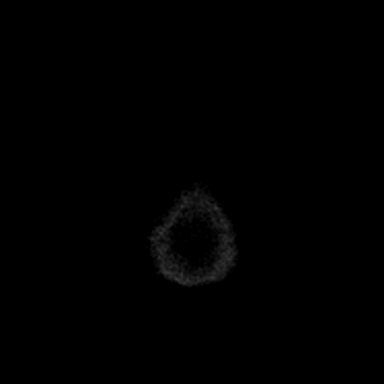

[Series 6: ax dwi_adc · axial · 3.0mm · 0.60mm/px · z∈[-60,+93]mm · 4 of 48 slices shown]
[im 1/48]
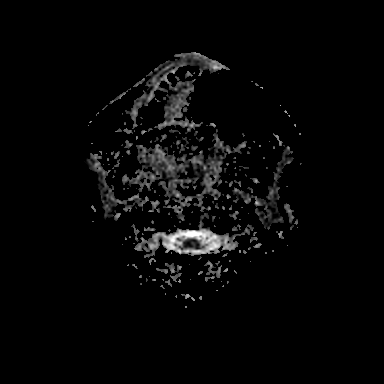
[im 16/48]
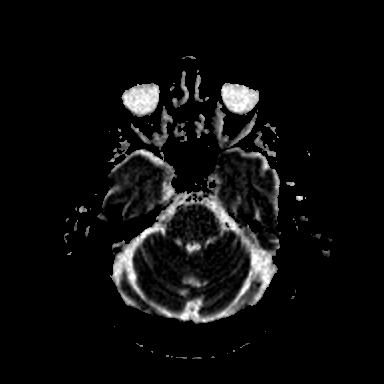
[im 32/48]
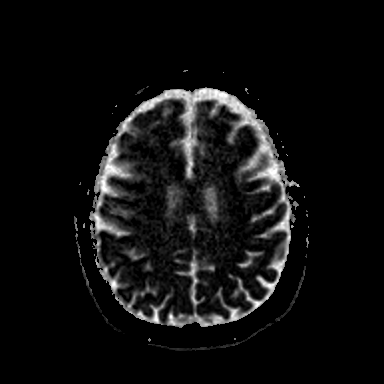
[im 48/48]
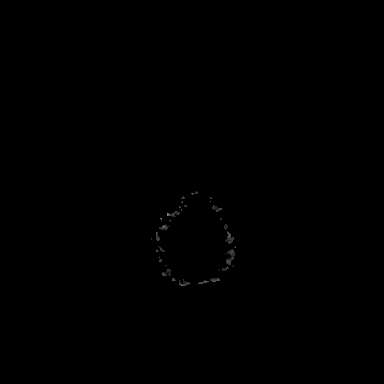

[Series 7: cor dwi_tracew · coronal · 5.0mm · 1.31mm/px · 3 of 36 slices shown]
[im 1/36]
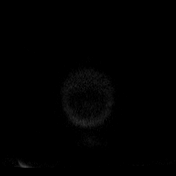
[im 18/36]
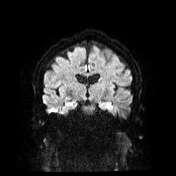
[im 36/36]
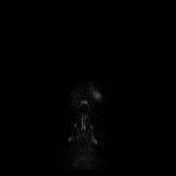

[Series 8: cor dwi_adc · coronal · 5.0mm · 1.31mm/px · 3 of 36 slices shown]
[im 1/36]
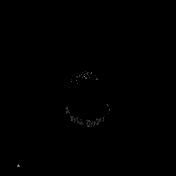
[im 18/36]
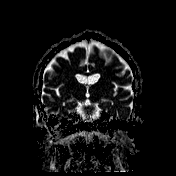
[im 36/36]
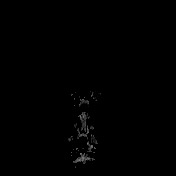

[Series 9: T1 · sagittal · 5.0mm · 0.62mm/px · 2 of 21 slices shown (1 of 2)]
[im 1/21]
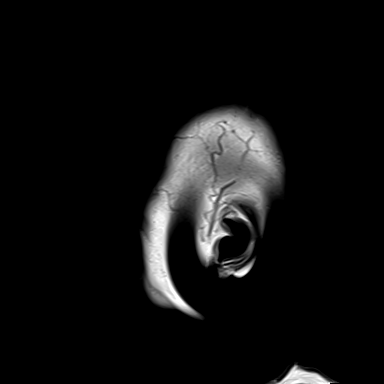
[im 21/21]
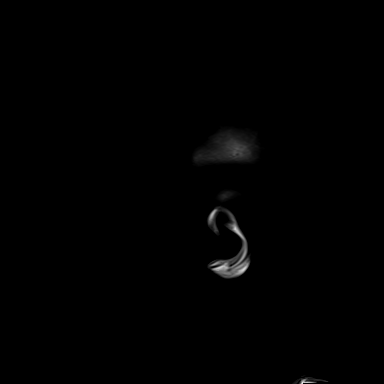

[Series 10: T2 · axial · 5.0mm · 0.53mm/px · z∈[-61,+93]mm · 2 of 27 slices shown (1 of 2)]
[im 1/27]
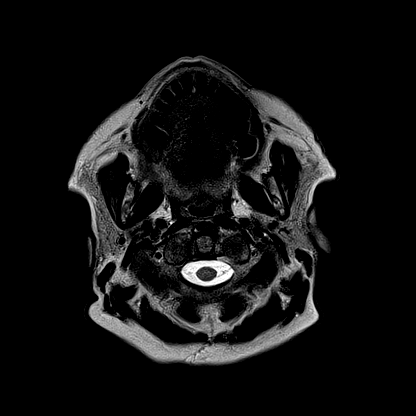
[im 27/27]
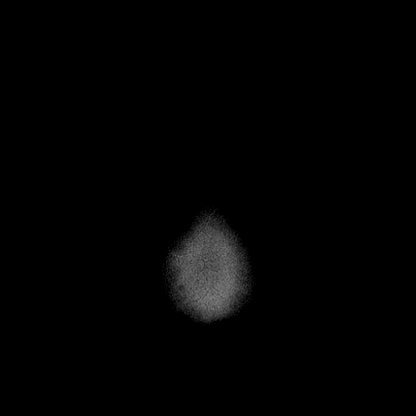

[Series 12: pha_images · axial · 3.0mm · 0.90mm/px · z∈[-71,+101]mm · 5 of 59 slices shown]
[im 1/59]
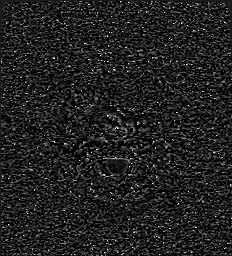
[im 15/59]
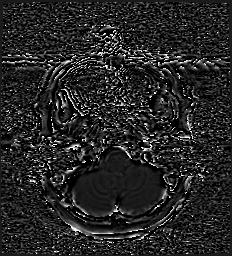
[im 30/59]
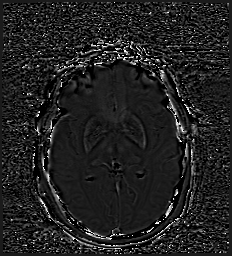
[im 44/59]
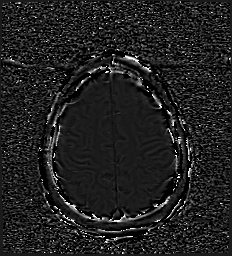
[im 59/59]
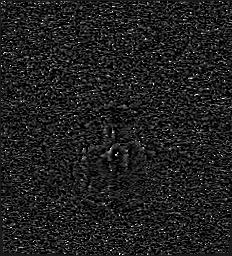

[Series 13: swi_images · axial · 3.0mm · 0.90mm/px · z∈[-71,+104]mm · 5 of 60 slices shown]
[im 1/60]
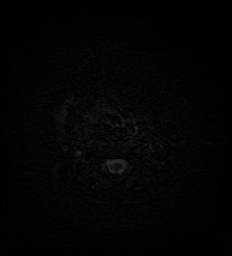
[im 15/60]
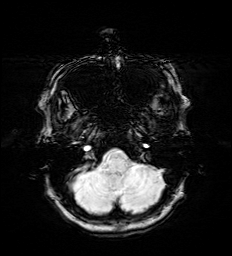
[im 30/60]
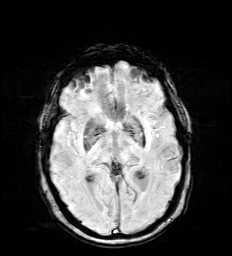
[im 45/60]
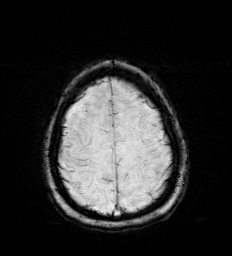
[im 60/60]
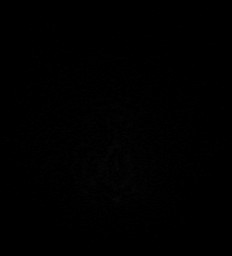

[Series 15: FLAIR · axial · 3.0mm · 0.53mm/px · z∈[-64,+96]mm · 4 of 55 slices shown]
[im 1/55]
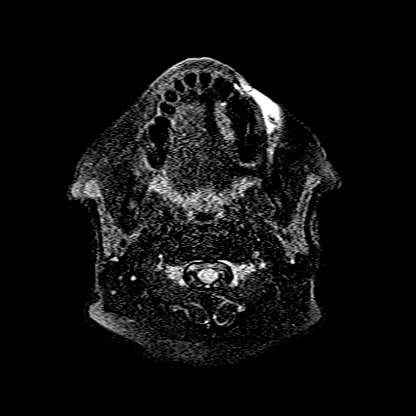
[im 19/55]
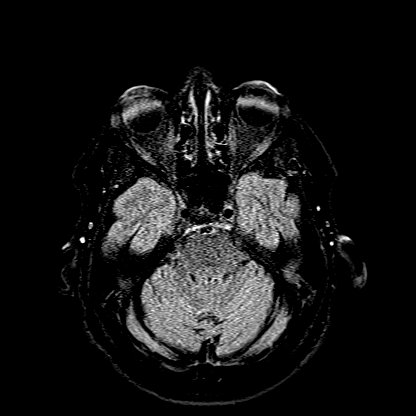
[im 37/55]
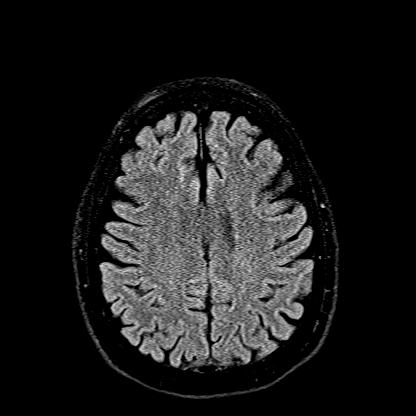
[im 55/55]
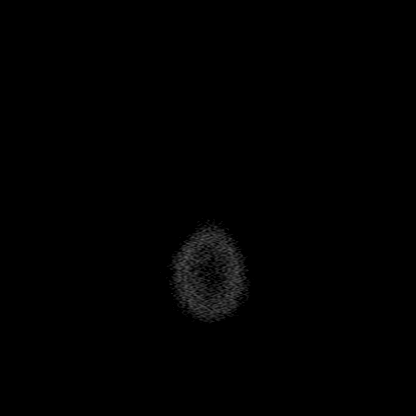

[Series 16: T1 · axial · 1.0mm · 0.98mm/px · z∈[-63,+110]mm · 14 of 176 slices shown (2 of 2)]
[im 1/176]
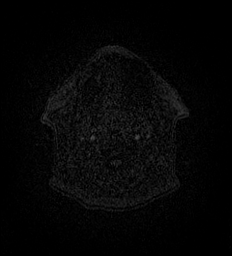
[im 14/176]
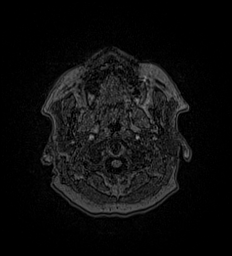
[im 27/176]
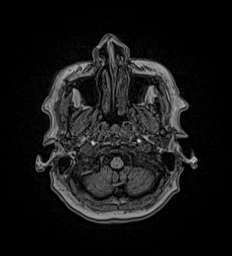
[im 41/176]
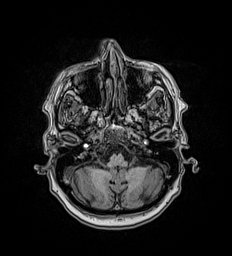
[im 54/176]
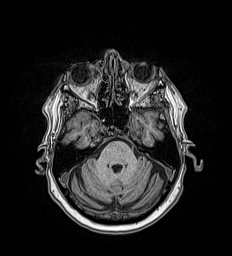
[im 68/176]
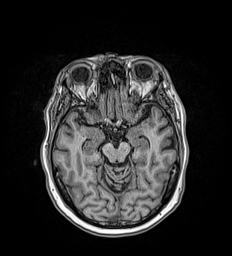
[im 81/176]
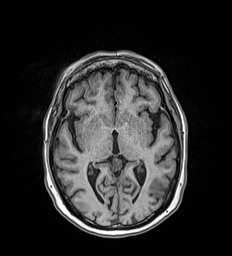
[im 95/176]
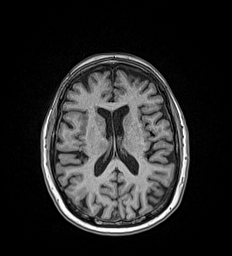
[im 108/176]
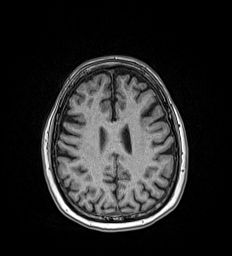
[im 122/176]
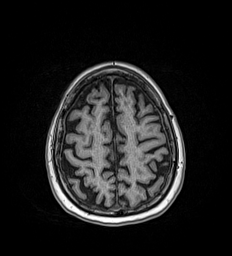
[im 135/176]
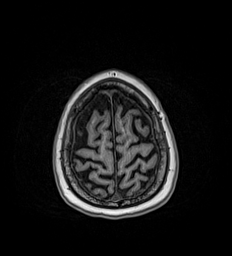
[im 149/176]
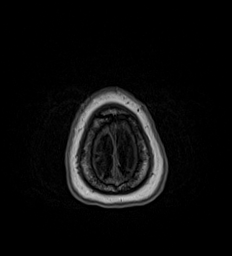
[im 162/176]
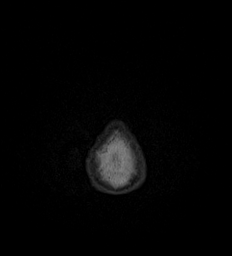
[im 176/176]
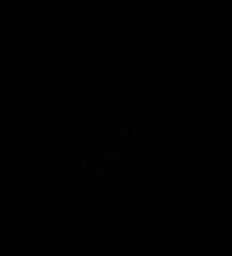

[Series 17: T2 · coronal · 5.0mm · 0.57mm/px · 2 of 29 slices shown (2 of 2)]
[im 1/29]
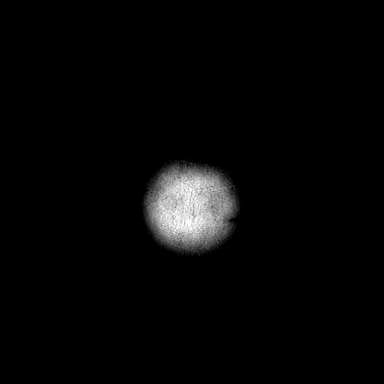
[im 29/29]
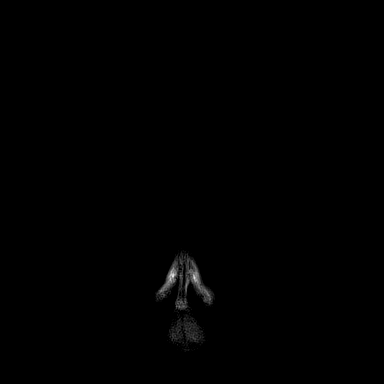

[48 of 48 positions shown; findings below may reference images not displayed]

FINDINGS: Brain:

There is no evidence of acute infarct.

No evidence of intracranial mass.

No midline shift or extra-axial fluid collection.

No chronic intracranial blood products.

A few scattered punctate foci of T2/FLAIR hyperintensity within the
cerebral white matter are nonspecific and not uncommonly seen in
patients of this age.

Mild generalized parenchymal atrophy.

Vascular: Flow voids maintained within the proximal large arterial
vessels. Non dominant intracranial left vertebral artery.

Skull and upper cervical spine: No focal marrow lesion

Sinuses/Orbits: Visualized orbits demonstrate no acute abnormality.
Minimal ethmoid sinus mucosal thickening. No significant mastoid
effusion.
IMPRESSION: 1. Mild generalized parenchymal atrophy.
2. Otherwise unremarkable MRI appearance of the brain for age.

## 2020-07-29 ENCOUNTER — Other Ambulatory Visit: Payer: Self-pay

## 2020-07-29 ENCOUNTER — Ambulatory Visit (INDEPENDENT_AMBULATORY_CARE_PROVIDER_SITE_OTHER): Payer: Medicare HMO | Admitting: Internal Medicine

## 2020-07-29 VITALS — BP 130/78 | HR 68 | Temp 98.2°F | Wt 119.0 lb

## 2020-07-29 DIAGNOSIS — F32A Depression, unspecified: Secondary | ICD-10-CM

## 2020-07-29 DIAGNOSIS — R748 Abnormal levels of other serum enzymes: Secondary | ICD-10-CM | POA: Diagnosis not present

## 2020-07-29 DIAGNOSIS — F1010 Alcohol abuse, uncomplicated: Secondary | ICD-10-CM | POA: Diagnosis not present

## 2020-07-29 DIAGNOSIS — F419 Anxiety disorder, unspecified: Secondary | ICD-10-CM

## 2020-07-29 DIAGNOSIS — R413 Other amnesia: Secondary | ICD-10-CM

## 2020-07-29 NOTE — Progress Notes (Signed)
Subjective:    Patient ID: Golden Circle, female    DOB: May 23, 1950, 69 y.o.   MRN: [id number]  HPI  Patient presents to the clinic today with concerns for memory loss.  This has been an ongoing issue, first mentioned to me back in 2020.  She reports this got to the point where she is getting lost going places that she is very familiar with.  She has noted that she is left the oven burner on more than once.  She does feel like she is more forgetful than she should, unable to remember dates, names, numbers or where she puts things.  She does report drinking excessively, she consumes 1-2 bottles of wine per day, occasional beer and fluid in her coffee every morning.  She does feel like she is drinking excessively and would like referral to a therapist that could help her with her drinking problem.  She reports her drinking problem is stemmed from being in an unhappy marriage.  She does have a history of anxiety and depression.  She is currently taking Sertraline 25 mg daily but does not feel like this is as effective as it once was.  She is not currently seeing a therapist.  She denies SI/HI.  She is accompanied today by a lifelong friend who is her main social support.  Review of Systems      Past Medical History:  Diagnosis Date  . Allergy   . Anxiety   . Hypertension     Current Outpatient Medications  Medication Sig Dispense Refill  . Cholecalciferol (VITAMIN D3) 50 MCG (2000 UT) TABS Take by mouth.    . hydrochlorothiazide (HYDRODIURIL) 25 MG tablet Take 1 tablet (25 mg total) by mouth daily. 90 tablet 1  . losartan (COZAAR) 100 MG tablet Take 1 tablet (100 mg total) by mouth daily. MUST SCHEDULE PHYSICAL EXAM 90 tablet 0  . rosuvastatin (CRESTOR) 10 MG tablet Take 1 tablet (10 mg total) by mouth daily. 90 tablet 2  . sertraline (ZOLOFT) 50 MG tablet TAKE 1/2 TABLET BY MOUTH EVERY DAY 45 tablet 0  . traMADol (ULTRAM) 50 MG tablet TAKE 1 TABLET (50 MG TOTAL) BY MOUTH EVERY 8 (EIGHT)  HOURS AS NEEDED. FOR PAIN 30 tablet 0   No current facility-administered medications for this visit.    Allergies  Allergen Reactions  . Erythromycin Other (See Comments)    REACTION: GI upset  . Penicillins Nausea And Vomiting    Family History  Problem Relation Age of Onset  . Diabetes Mother   . Stroke Mother   . Other Mother        colon sugery  . Polymyalgia rheumatica Mother   . Heart disease Father   . Diabetes Father   . Colon cancer Neg Hx   . Cancer Neg Hx     Social History   Socioeconomic History  . Marital status: Married    Spouse name: Not on file  . Number of children: 2  . Years of education: Not on file  . Highest education level: Not on file  Occupational History  . Occupation: self employed    Employer: OTHER  Tobacco Use  . Smoking status: Current Every Day Smoker    Packs/day: 0.75    Types: Cigarettes  . Smokeless tobacco: Never Used  Vaping Use  . Vaping Use: Never used  Substance and Sexual Activity  . Alcohol use: Yes    Alcohol/week: 10.0 standard drinks    Types: 10  Cans of beer per week  . Drug use: No  . Sexual activity: Not on file  Other Topics Concern  . Not on file  Social History Narrative  . Not on file   Social Determinants of Health   Financial Resource Strain: Not on file  Food Insecurity: Not on file  Transportation Needs: Not on file  Physical Activity: Not on file  Stress: Not on file  Social Connections: Not on file  Intimate Partner Violence: Not on file     Constitutional: Denies fever, malaise, fatigue, headache or abrupt weight changes.  Respiratory: Denies difficulty breathing, shortness of breath, cough or sputum production.   Cardiovascular: Denies chest pain, chest tightness, palpitations or swelling in the hands or feet.  Neurological: Patient reports difficulty with memory.  Denies dizziness, difficulty with speech or problems with balance and coordination.  Psych: Patient reports as excessive  drinking, anxiety and depression.  Denies SI/HI.  No other specific complaints in a complete review of systems (except as listed in HPI above).  Objective:   Physical Exam    BP 130/78   Pulse 68   Temp 98.2 F (36.8 C) (Temporal)   Wt 54 kg   SpO2 98%   BMI 24.04 kg/m  Wt Readings from Last 3 Encounters:  07/29/20 54 kg  01/08/20 52.6 kg  12/29/19 56.7 kg    General: Appears her stated age, well developed, well nourished in NAD. Cardiovascular: Normal rate and rhythm.  Pulmonary/Chest: Normal effort and positive vesicular breath sounds. No respiratory distress. No wheezes, rales or ronchi noted.  Neurological: Alert and oriented.  MMSE score of 25/30. Psychiatric: Mood and affect flat. Behavior is normal. Judgment and thought content normal.     BMET    Component Value Date/Time   NA 138 07/29/2020 1601   K 4.6 07/29/2020 1601   CL 103 07/29/2020 1601   CO2 27 07/29/2020 1601   GLUCOSE 94 07/29/2020 1601   BUN 11 07/29/2020 1601   CREATININE 0.72 07/29/2020 1601   CALCIUM 9.7 07/29/2020 1601   GFRNONAA >60 01/01/2020 0405   GFRAA >60 01/01/2020 0405    Lipid Panel     Component Value Date/Time   CHOL 187 05/13/2019 1228   TRIG 169.0 (H) 05/13/2019 1228   HDL 41.90 05/13/2019 1228   CHOLHDL 4 05/13/2019 1228   VLDL 33.8 05/13/2019 1228   LDLCALC 111 (H) 05/13/2019 1228    CBC    Component Value Date/Time   WBC 7.7 07/29/2020 1601   RBC 3.72 (L) 07/29/2020 1601   HGB 12.3 07/29/2020 1601   HCT 36.1 07/29/2020 1601   PLT 238.0 07/29/2020 1601   MCV 97.1 07/29/2020 1601   MCH 32.4 12/31/2019 0904   MCHC 34.1 07/29/2020 1601   RDW 15.1 07/29/2020 1601   LYMPHSABS 1.5 12/31/2019 0904   MONOABS 1.0 12/31/2019 0904   EOSABS 0.1 12/31/2019 0904   BASOSABS 0.0 12/31/2019 0904    Hgb A1C Lab Results  Component Value Date   HGBA1C 5.7 04/13/2015          Assessment & Plan:  Memory Loss:  CBC, CMET, TSH, Vit D and B12 today MRI from 2020  reviewed- no indication to repeat this at this time Referral to neurology for further evaluation of symptoms  Will follow up after labs, return precautions discussed  Webb Silversmith, NP This visit occurred during the SARS-CoV-2 public health emergency.  Safety protocols were in place, including screening questions prior to the visit, additional  usage of staff PPE, and extensive cleaning of exam room while observing appropriate contact time as indicated for disinfecting solutions.

## 2020-07-30 LAB — CBC
HCT: 36.1 % (ref 36.0–46.0)
Hemoglobin: 12.3 g/dL (ref 12.0–15.0)
MCHC: 34.1 g/dL (ref 30.0–36.0)
MCV: 97.1 fl (ref 78.0–100.0)
Platelets: 238 10*3/uL (ref 150.0–400.0)
RBC: 3.72 Mil/uL — ABNORMAL LOW (ref 3.87–5.11)
RDW: 15.1 % (ref 11.5–15.5)
WBC: 7.7 10*3/uL (ref 4.0–10.5)

## 2020-07-30 LAB — COMPREHENSIVE METABOLIC PANEL
ALT: 9 U/L (ref 0–35)
AST: 15 U/L (ref 0–37)
Albumin: 4.1 g/dL (ref 3.5–5.2)
Alkaline Phosphatase: 53 U/L (ref 39–117)
BUN: 11 mg/dL (ref 6–23)
CO2: 27 mEq/L (ref 19–32)
Calcium: 9.7 mg/dL (ref 8.4–10.5)
Chloride: 103 mEq/L (ref 96–112)
Creatinine, Ser: 0.72 mg/dL (ref 0.40–1.20)
GFR: 86.13 mL/min (ref >60.00)
Glucose, Bld: 94 mg/dL (ref 70–99)
Potassium: 4.6 mEq/L (ref 3.5–5.1)
Sodium: 138 mEq/L (ref 135–145)
Total Bilirubin: 0.5 mg/dL (ref 0.2–1.2)
Total Protein: 6.9 g/dL (ref 6.0–8.3)

## 2020-07-30 LAB — TSH: TSH: 2.71 u[IU]/mL (ref 0.35–4.50)

## 2020-07-30 LAB — VITAMIN D 25 HYDROXY (VIT D DEFICIENCY, FRACTURES): VITD: 49.98 ng/mL (ref 30.00–100.00)

## 2020-07-30 LAB — VITAMIN B12: Vitamin B-12: >1506 pg/mL — ABNORMAL HIGH (ref 211–911)

## 2020-08-01 ENCOUNTER — Encounter: Payer: Self-pay | Admitting: Internal Medicine

## 2020-08-01 NOTE — Assessment & Plan Note (Signed)
Support offered today Increase Sertraline to 50 mg PO daily Referral to psych for CBT CBC, CMET, TSH, Vit D and B12 today

## 2020-08-01 NOTE — Patient Instructions (Signed)
Alcohol Abuse and Dependence Information, Adult Alcohol is a widely available drug. People drink alcohol in different amounts. People who drink alcohol very often and in large amounts often have problems during and after drinking. They may develop what is called an alcohol use disorder. There are two main types of alcohol use disorders:  Alcohol abuse. This is when you use alcohol too much or too often. You may use alcohol to make yourself feel happy or to reduce stress. You may have a hard time setting a limit on the amount you drink.  Alcohol dependence. This is when you use alcohol consistently for a period of time, and your body changes as a result. This can make it hard to stop drinking because you may start to feel sick or feel different when you do not use alcohol. These symptoms are known as withdrawal. How can alcohol abuse and dependence affect me? Alcohol abuse and dependence can have a negative effect on your life. Drinking too much can lead to addiction. You may feel like you need alcohol to function normally. You may drink alcohol before work in the morning, during the day, or as soon as you get home from work in the evening. These actions can result in:  Poor work performance.  Job loss.  Financial problems.  Car crashes or criminal charges from driving after drinking alcohol.  Problems in your relationships with friends and family.  Losing the trust and respect of coworkers, friends, and family. Drinking heavily over a long period of time can permanently damage your body and brain, and can cause lifelong health issues, such as:  Damage to your liver or pancreas.  Heart problems, high blood pressure, or stroke.  Certain cancers.  Decreased ability to fight infections.  Brain or nerve damage.  Depression.  Early (premature) death. If you are careless or you crave alcohol, it is easy to drink more than your body can handle (overdose). Alcohol overdose is a serious  situation that requires hospitalization. It may lead to permanent injuries or death. What can increase my risk?  Having a family history of alcohol abuse.  Having depression or other mental health conditions.  Beginning to drink at an early age.  Binge drinking often.  Experiencing trauma, stress, and an unstable home life during childhood.  Spending time with people who drink often. What actions can I take to prevent or manage alcohol abuse and dependence?  Do not drink alcohol if: ? Your health care provider tells you not to drink. ? You are pregnant, may be pregnant, or are planning to become pregnant.  If you drink alcohol: ? Limit how much you use to:  0-1 drink a day for women.  0-2 drinks a day for men. ? Be aware of how much alcohol is in your drink. In the [place] one drink equals one 12 oz bottle of beer (355 mL), one 5 oz glass of wine (148 mL), or one 1 oz glass of hard liquor (44 mL).  Stop drinking if you have been drinking too much. This can be very hard to do if you are used to abusing alcohol. If you begin to have withdrawal symptoms, talk with your health care provider or a person that you trust. These symptoms may include anxiety, shaky hands, headache, nausea, sweating, or not being able to sleep.  Choose to drink nonalcoholic beverages in social gatherings and places where there may be alcohol. Activity  Spend more time on activities that you enjoy that do   not involve alcohol, like hobbies or exercise.  Find healthy ways to cope with stress, such as exercise, meditation, or spending time with people you care about. General information  Talk to your family, coworkers, and friends about supporting you in your efforts to stop drinking. If they drink, ask them not to drink around you. Spend more time with people who do not drink alcohol.  If you think that you have an alcohol dependency problem: ? Tell friends or family about your concerns. ? Talk with your  health care provider or another health professional about where to get help. ? Work with a Transport planner and a Regulatory affairs officer. ? Consider joining a support group for people who struggle with alcohol abuse and dependence. Where to find support  Your health care provider.  SMART Recovery: www.smartrecovery.org Therapy and support groups  Local treatment centers or chemical dependency counselors.  Local AA groups in your community: [employer].com.pt   Where to find more information  Centers for Disease Control and Prevention: http://www.wolf.info/  National Institute on Alcohol Abuse and Alcoholism: http://www.bradshaw.com/  Alcoholics Anonymous (AA): [employer].com.pt Contact a health care provider if:  You drank more or for longer than you intended on more than one occasion.  You tried to stop drinking or to cut back on how much you drink, but you were not able to.  You often drink to the point of vomiting or passing out.  You want to drink so badly that you cannot think about anything else.  You have problems in your life due to drinking, but you continue to drink.  You keep drinking even though you feel anxious, depressed, or have experienced memory loss.  You have stopped doing the things you used to enjoy in order to drink.  You have to drink more than you used to in order to get the effect you want.  You experience anxiety, sweating, nausea, shakiness, and trouble sleeping when you try to stop drinking. Get help right away if:  You have thoughts about hurting yourself or others.  You have serious withdrawal symptoms, including: ? Confusion. ? Racing heart. ? High blood pressure. ? Fever. If you ever feel like you may hurt yourself or others, or have thoughts about taking your own life, get help right away. You can go to your nearest emergency department or call:  Your local emergency services (911 in the [place]  A suicide crisis helpline, such as the [facility] at (614) 505-2892. This is open 24 hours a day. Summary  Alcohol abuse and dependence can have a negative effect on your life. Drinking too much or too often can lead to addiction.  If you drink alcohol, limit how much you use.  If you are having trouble keeping your drinking under control, find ways to change your behavior. Hobbies, calming activities, exercise, or support groups can help.  If you feel you need help with changing your drinking habits, talk with your health care provider, a good friend, or a therapist, or go to an [place] group. This information is not intended to replace advice given to you by your health care provider. Make sure you discuss any questions you have with your health care provider. Document Revised: 08/20/2018 Document Reviewed: 07/09/2018 Elsevier Patient Education  [employer].

## 2020-08-05 NOTE — Addendum Note (Signed)
Addended by: Lurlean Nanny on: 08/05/2020 04:34 PM   Modules accepted: Orders

## 2020-08-10 ENCOUNTER — Telehealth: Payer: Self-pay | Admitting: Internal Medicine

## 2020-08-10 NOTE — Telephone Encounter (Signed)
Patient is changing her pharmacy to the following: [facility] They will be sending [place] a fax and she wanted to tell us it was ok to switch her.

## 2020-08-10 NOTE — Telephone Encounter (Signed)
Can we change pharmacy in the chart

## 2020-08-16 ENCOUNTER — Telehealth: Payer: Self-pay | Admitting: Internal Medicine

## 2020-08-16 NOTE — Telephone Encounter (Signed)
[name] [name] called in due to she called the [facility] and they stated they dont take her insurance. And she stated that she wants a man doctor. And she has [employer].    Please advise

## 2020-08-16 NOTE — Telephone Encounter (Signed)
[name] [name], can we follow up on this referral for mental health services.

## 2020-08-17 ENCOUNTER — Telehealth: Payer: Self-pay

## 2020-08-17 NOTE — Telephone Encounter (Signed)
[place] Night - Client Nonclinical Telephone Record AccessNurse Client [facility] Night - Client Client Site [place] Physician Webb Silversmith - NP Contact Type Call Who Is Calling Patient / Member / Family / Caregiver Caller Name Nira Conn with [facility] Phone Number [phone] Patient Name [name] [name] Patient DOB [birth year] Call Type Message Only Information Provided Reason for Call Request for General Office Information Initial Comment Caller states she is calling from [facility] regarding refill Additional Comment Office hours provided Disp. Time Disposition Final User 08/16/2020 6:55:23 PM General Information Provided Yes Sharia Reeve Call Closed By: Sharia Reeve Transaction Date/Time: 08/16/2020 6:52:55 PM (ET)

## 2020-08-17 NOTE — Telephone Encounter (Signed)
Left message for the patient to call me back to advise. I gave patient several options when we spoke originally, will follow up to see if she has reached out to all of the offices or just one.

## 2020-08-19 MED ORDER — LOSARTAN POTASSIUM 100 MG PO TABS: 100.0000 mg | ORAL_TABLET | Freq: Every day | ORAL | 3 refills | Status: DC

## 2020-08-25 NOTE — Telephone Encounter (Signed)
Spoke with patient. She received a list from [facility] on therapist.counselors and is working on scheduling with someone. She will let us know when she is able to get something set up

## 2020-09-28 ENCOUNTER — Ambulatory Visit: Payer: Self-pay | Admitting: *Deleted

## 2020-09-28 NOTE — Telephone Encounter (Signed)
Pt reports increased depression "I'm a basket case and I need [name], she always helps me" Triggered by learning of husbands affair, 2 weeks ago. States has lost weight, not sleeping, daily activities difficult.States "Drinking alcohol more than I should." No suicidal  or homicidal ideation. Has friends as good support system, no therapist. Does not feel unsafe in home.Requesting AA and therapist referral. Next available with [facility] Thursday. States "I need her sooner." Per disposition, 24hr. appt. Advised no availability until Thursday, repeats "I need to speak with her soon."  Assured NT would route to practice for PCPs review and final disposition. [facility] [place] number provided as resource. Also advised ED for suicidal  thoughts. Pt verbalizes understanding.    Please advise: [phone] Reason for Disposition . [1] Depression AND [2] worsening (e.g.,sleeping poorly, less able to do activities of daily living)  Answer Assessment - Initial Assessment Questions 1. CONCERN: "What happened that made you call today?"     Husband having affair 2. DEPRESSION SYMPTOM SCREENING: "How are you feeling overall?" (e.g., decreased energy, increased sleeping or difficulty sleeping, difficulty concentrating, feelings of sadness, guilt, hopelessness, or worthlessness)     All of the above 3. RISK OF HARM - SUICIDAL IDEATION:  "Do you ever have thoughts of hurting or killing yourself?"  (e.g., yes, no, no but preoccupation with thoughts about death)   - INTENT:  "Do you have thoughts of hurting or killing yourself right NOW?" (e.g., yes, no, N/A)   - PLAN: "Do you have a specific plan for how you would do this?" (e.g., gun, knife, overdose, no plan, N/A)     no 4. RISK OF HARM - HOMICIDAL IDEATION:  "Do you ever have thoughts of hurting or killing someone else?"  (e.g., yes, no, no but preoccupation with thoughts about death)   - INTENT:  "Do you have thoughts of hurting or killing someone right NOW?"  (e.g., yes, no, N/A)   - PLAN: "Do you have a specific plan for how you would do this?" (e.g., gun, knife, no plan, N/A)      no 5. FUNCTIONAL IMPAIRMENT: "How have things been going for you overall? Have you had more difficulty than usual doing your normal daily activities?"  (e.g., better, same, worse; self-care, school, work, interactions)     Yes. Drinking more 6. SUPPORT: "Who is with you now?" "Who do you live with?" "Do you have family or friends who you can talk to?"       Friends 7. THERAPIST: "Do you have a counselor or therapist? Name?"     No 8. STRESSORS: "Has there been any new stress or recent changes in your life?"     Yes husband 9. ALCOHOL USE OR SUBSTANCE USE (DRUG USE): "Do you drink alcohol or use any illegal drugs?"     Alcohol 10. OTHER: "Do you have any other physical symptoms right now?" (e.g., fever)       Husband having affair. Drinking more  Protocols used: DEPRESSION-A-AH

## 2020-09-29 NOTE — Telephone Encounter (Signed)
Unfortunately I will not be able to see her before Thursday. Recommend appt for that time, and encourage [facility] or behavioral health urgent care if needed prior to that time

## 2020-09-29 NOTE — Telephone Encounter (Signed)
Left a message on the patient vm that appt are available for tomorrow if she would like to schedule.

## 2020-10-01 ENCOUNTER — Encounter: Payer: Self-pay | Admitting: Internal Medicine

## 2020-10-01 ENCOUNTER — Other Ambulatory Visit: Payer: Self-pay

## 2020-10-01 ENCOUNTER — Ambulatory Visit (INDEPENDENT_AMBULATORY_CARE_PROVIDER_SITE_OTHER): Payer: Medicare HMO | Admitting: Internal Medicine

## 2020-10-01 VITALS — BP 148/81 | HR 83 | Temp 98.0°F | Resp 17 | Ht 59.0 in | Wt 111.2 lb

## 2020-10-01 DIAGNOSIS — F419 Anxiety disorder, unspecified: Secondary | ICD-10-CM | POA: Diagnosis not present

## 2020-10-01 DIAGNOSIS — F32A Depression, unspecified: Secondary | ICD-10-CM

## 2020-10-01 DIAGNOSIS — Z630 Problems in relationship with spouse or partner: Secondary | ICD-10-CM

## 2020-10-01 DIAGNOSIS — F1020 Alcohol dependence, uncomplicated: Secondary | ICD-10-CM

## 2020-10-01 DIAGNOSIS — F1011 Alcohol abuse, in remission: Secondary | ICD-10-CM | POA: Insufficient documentation

## 2020-10-01 MED ORDER — BUSPIRONE HCL 5 MG PO TABS: 5.0000 mg | ORAL_TABLET | Freq: Three times a day (TID) | ORAL | 2 refills | Status: DC

## 2020-10-01 MED ORDER — SERTRALINE HCL 100 MG PO TABS: 100.0000 mg | ORAL_TABLET | Freq: Every day | ORAL | 0 refills | Status: DC

## 2020-10-01 NOTE — Assessment & Plan Note (Signed)
She will search for AA meetings her area

## 2020-10-01 NOTE — Assessment & Plan Note (Signed)
Will increase Sertraline 100 mg daily Buspar 5 mg TID prn Consider [email or web address] Amb ref to psychology for therapy Support offered

## 2020-10-01 NOTE — Patient Instructions (Signed)

## 2020-10-01 NOTE — Progress Notes (Signed)
Subjective:    Patient ID: [name] [name], female    DOB: June 08, 1950, 70 y.o.   MRN: [id number]  HPI  Pt presents to the clinic today for follow up anxiety, depression and alcoholism. She recently found out her husband is having an affair. She is unable to eat or sleep. She has been drinking more than usual about 2 beers per month, 1 bottle of wine 2-3 times per week, no liquor consumption. She is taking Sertraline as prescribed. She would like referral to [facility] and a therapist.  Review of Systems      Past Medical History:  Diagnosis Date  . Allergy   . Anxiety   . Hypertension     Current Outpatient Medications  Medication Sig Dispense Refill  . Cholecalciferol (VITAMIN D3) 50 MCG (2000 UT) TABS Take by mouth.    . hydrochlorothiazide (HYDRODIURIL) 25 MG tablet Take 1 tablet (25 mg total) by mouth daily. 90 tablet 1  . losartan (COZAAR) 100 MG tablet Take 1 tablet (100 mg total) by mouth daily. 90 tablet 3  . rosuvastatin (CRESTOR) 10 MG tablet Take 1 tablet (10 mg total) by mouth daily. 90 tablet 2  . sertraline (ZOLOFT) 50 MG tablet TAKE 1/2 TABLET BY MOUTH EVERY DAY 45 tablet 0  . traMADol (ULTRAM) 50 MG tablet TAKE 1 TABLET (50 MG TOTAL) BY MOUTH EVERY 8 (EIGHT) HOURS AS NEEDED. FOR PAIN 30 tablet 0   No current facility-administered medications for this visit.    Allergies  Allergen Reactions  . Erythromycin Other (See Comments)    REACTION: GI upset  . Penicillins Nausea And Vomiting    Family History  Problem Relation Age of Onset  . Diabetes Mother   . Stroke Mother   . Other Mother        colon sugery  . Polymyalgia rheumatica Mother   . Heart disease Father   . Diabetes Father   . Colon cancer Neg Hx   . Cancer Neg Hx     Social History   Socioeconomic History  . Marital status: Married    Spouse name: Not on file  . Number of children: 2  . Years of education: Not on file  . Highest education level: Not on file  Occupational History  . Occupation:  self employed    Employer: OTHER  Tobacco Use  . Smoking status: Current Every Day Smoker    Packs/day: 0.75    Types: Cigarettes  . Smokeless tobacco: Never Used  Vaping Use  . Vaping Use: Never used  Substance and Sexual Activity  . Alcohol use: Yes    Alcohol/week: 10.0 standard drinks    Types: 10 Cans of beer per week  . Drug use: No  . Sexual activity: Not on file  Other Topics Concern  . Not on file  Social History Narrative  . Not on file   Social Determinants of Health   Financial Resource Strain: Not on file  Food Insecurity: Not on file  Transportation Needs: Not on file  Physical Activity: Not on file  Stress: Not on file  Social Connections: Not on file  Intimate Partner Violence: Not on file     Constitutional: Pt reports weight loss. Denies fever, malaise, fatigue, headache.  Respiratory: Denies difficulty breathing, shortness of breath, cough or sputum production.   Cardiovascular: Denies chest pain, chest tightness, palpitations or swelling in the hands or feet.  Gastrointestinal: Pt reports poor appetite. Denies abdominal pain, bloating, constipation, diarrhea or  blood in the stool.  Neurological: Pt reports insomnia. Denies dizziness, difficulty with memory, difficulty with speech or problems with balance and coordination.  Psych: Pt reports anxiety and depression. Denies SI/HI.  No other specific complaints in a complete review of systems (except as listed in HPI above).  Objective:   Physical Exam  BP (!) 148/81 (BP Location: Right Arm, Patient Position: Sitting, Cuff Size: Small)   Pulse 83   Temp 98 F (36.7 C) (Temporal)   Resp 17   Ht 4\' 11"  (1.499 m)   Wt 111 lb 3.2 oz (50.4 kg)   SpO2 100%   BMI 22.46 kg/m   Wt Readings from Last 3 Encounters:  07/29/20 119 lb (54 kg)  01/08/20 116 lb (52.6 kg)  12/29/19 125 lb (56.7 kg)    General: Appears her stated age, well developed, well nourished in NAD. Skin: Warm, dry and intact.   HEENT: Head: normal shape and size; Eyes: sclera white and EOMs intact;  Cardiovascular: Normal rate. Pulmonary/Chest: Normal effort.  Musculoskeletal: No difficulty with gait.  Neurological: Alert and oriented.  Psychiatric: Tearful.  BMET    Component Value Date/Time   NA 138 07/29/2020 1601   K 4.6 07/29/2020 1601   CL 103 07/29/2020 1601   CO2 27 07/29/2020 1601   GLUCOSE 94 07/29/2020 1601   BUN 11 07/29/2020 1601   CREATININE 0.72 07/29/2020 1601   CALCIUM 9.7 07/29/2020 1601   GFRNONAA >60 01/01/2020 0405   GFRAA >60 01/01/2020 0405    Lipid Panel     Component Value Date/Time   CHOL 187 05/13/2019 1228   TRIG 169.0 (H) 05/13/2019 1228   HDL 41.90 05/13/2019 1228   CHOLHDL 4 05/13/2019 1228   VLDL 33.8 05/13/2019 1228   LDLCALC 111 (H) 05/13/2019 1228    CBC    Component Value Date/Time   WBC 7.7 07/29/2020 1601   RBC 3.72 (L) 07/29/2020 1601   HGB 12.3 07/29/2020 1601   HCT 36.1 07/29/2020 1601   PLT 238.0 07/29/2020 1601   MCV 97.1 07/29/2020 1601   MCH 32.4 12/31/2019 0904   MCHC 34.1 07/29/2020 1601   RDW 15.1 07/29/2020 1601   LYMPHSABS 1.5 12/31/2019 0904   MONOABS 1.0 12/31/2019 0904   EOSABS 0.1 12/31/2019 0904   BASOSABS 0.0 12/31/2019 0904    Hgb A1C Lab Results  Component Value Date   HGBA1C 5.7 04/13/2015           Assessment & Plan:    Webb Silversmith, NP This visit occurred during the SARS-CoV-2 public health emergency.  Safety protocols were in place, including screening questions prior to the visit, additional usage of staff PPE, and extensive cleaning of exam room while observing appropriate contact time as indicated for disinfecting solutions.

## 2020-10-07 NOTE — Telephone Encounter (Signed)
Copied from [place] [phone]. Topic: General - Inquiry >> Oct 07, 2020  2:04 PM Greggory Keen D wrote: Reason for CRM: Pt called asking if Webb Silversmith could give her a referral to [facility] outpatient program.  CB#  [phone]

## 2020-10-08 NOTE — Telephone Encounter (Signed)
Referral placed, hardcopy placed in [facility]

## 2020-10-08 NOTE — Telephone Encounter (Signed)
[facility] prefers if patient's self refer.  All she needs to do is call and schedule an appointment

## 2020-10-13 ENCOUNTER — Telehealth: Payer: Self-pay

## 2020-10-13 NOTE — Telephone Encounter (Signed)
The pt was called and notified that an hard copy was printed and left up front for pick up.

## 2020-10-17 ENCOUNTER — Other Ambulatory Visit: Payer: Self-pay | Admitting: Internal Medicine

## 2020-10-20 DIAGNOSIS — F419 Anxiety disorder, unspecified: Secondary | ICD-10-CM | POA: Diagnosis not present

## 2020-10-20 DIAGNOSIS — F331 Major depressive disorder, recurrent, moderate: Secondary | ICD-10-CM | POA: Diagnosis not present

## 2020-10-20 DIAGNOSIS — F1020 Alcohol dependence, uncomplicated: Secondary | ICD-10-CM | POA: Diagnosis not present

## 2020-10-25 ENCOUNTER — Ambulatory Visit (INDEPENDENT_AMBULATORY_CARE_PROVIDER_SITE_OTHER): Payer: Medicare HMO | Admitting: Neurology

## 2020-10-25 ENCOUNTER — Encounter: Payer: Self-pay | Admitting: Neurology

## 2020-10-25 ENCOUNTER — Other Ambulatory Visit: Payer: Self-pay

## 2020-10-25 ENCOUNTER — Telehealth: Payer: Self-pay

## 2020-10-25 VITALS — BP 160/81 | HR 73 | Ht 59.0 in | Wt 113.0 lb

## 2020-10-25 DIAGNOSIS — R413 Other amnesia: Secondary | ICD-10-CM | POA: Diagnosis not present

## 2020-10-25 DIAGNOSIS — F32A Depression, unspecified: Secondary | ICD-10-CM

## 2020-10-25 DIAGNOSIS — F439 Reaction to severe stress, unspecified: Secondary | ICD-10-CM

## 2020-10-25 DIAGNOSIS — F17200 Nicotine dependence, unspecified, uncomplicated: Secondary | ICD-10-CM | POA: Diagnosis not present

## 2020-10-25 DIAGNOSIS — F419 Anxiety disorder, unspecified: Secondary | ICD-10-CM | POA: Diagnosis not present

## 2020-10-25 NOTE — Progress Notes (Signed)
Subjective:    Patient ID: [name] [name] is a 70 y.o. female.  HPI    Star Age, MD, PhD [facility] [address].O. [place], [place] [address]  Dear [name],   I saw your patient, [name] [name], upon your kind request, in my neurologic clinic today for initial consultation of her memory loss.  The patient is unaccompanied today.  As you know, [name] is a 70 year old right-handed woman with an underlying medical history of hypertension, allergies, anxiety, depression, and excessive alcohol consumption, who reports forgetfulness as well as getting lost while driving.  Symptoms have worsened over the past 4 to 5 years.  She reports that her father had memory issues in his later years, he lived to be around 63.  Her mom lived to be 105 and did not have any memory issues but patient reports that mom had a problem with drinking alcohol.  She has been under stress, she reports marital issues.  She is not separated, husband lives with her, they have been married for over 40 years but she reports that he has not been faithful.  She has not been in marital counseling.  She has had some group therapy she reports.  She has 2 grown children, daughter lives in [place] state and son lives in [place].  She has a brother in [place] and a sister in [place], neither with memory issues.  She does not sleep well currently, she does not take any medication to help her sleep.  She quit drinking wine/alcohol some 3 to 4 weeks ago completely.  She reports residual depression and anxiety.  She is currently on BuSpar and sertraline, she denies any suicidal ideations. I reviewed your office note from 07/29/2020.  She endorsed anxiety and depression and excessive alcohol consumption at the time.  She was referred to psychology.  She had blood work through your office on 07/29/2020 and I reviewed the results: CBC showed RBC of 3.72, hemoglobin normal at 12.3, hematocrit normal at  36.1.  CMP showed benign findings, sodium 138, potassium 4.6, glucose 94, BUN 11, creatinine 0.72.  TSH was normal at 2.71.  Vitamin D was normal at 49.98.  Vitamin B12 was 1506.  She was advised to reduce her vitamin B-12 supplementation.  She has since then also been referred to psychiatry.  She had a brain MRI without contrast on 03/23/2019 through [facility].  Indication was memory loss for 1 year.  I reviewed the test results: IMPRESSION: 1. Mild generalized parenchymal atrophy. 2. Otherwise unremarkable MRI appearance of the brain for age.  She reports loss of appetite.  She has lost weight but recently gained about 3 pounds back.  She does not drink a whole lot of water, likes to drink unsweetened green tea which she believes herself.  She drinks coffee in the morning, about 2 cups/day.  She smokes cigarettes, she has recently increased her smoking secondary to stress.  She drinks more than 1 pack/day currently.  She retired as a Pharmacist, hospital.  She lives with her husband.  Her Past Medical History Is Significant For: Past Medical History:  Diagnosis Date   Allergy    Anxiety    Hypertension     Her Past Surgical History Is Significant For: Past Surgical History:  Procedure Laterality Date   ABLATION     uterine   ANAL FISSURE REPAIR     age 6 and college   DILATION AND CURETTAGE OF UTERUS  HEMORRHOID BANDING  06/02/2013   TONSILLECTOMY     age 60    Her Family History Is Significant For: Family History  Problem Relation Age of Onset   Diabetes Mother    Stroke Mother    Other Mother        colon sugery   Polymyalgia rheumatica Mother    Heart disease Father    Diabetes Father    Colon cancer Neg Hx    Cancer Neg Hx     Her Social History Is Significant For: Social History   Socioeconomic History   Marital status: Married    Spouse name: Not on file   Number of children: 2   Years of education: Not on file   Highest education level: Not on  file  Occupational History   Occupation: self employed    Employer: OTHER  Tobacco Use   Smoking status: Every Day    Packs/day: 0.75    Pack years: 0.00    Types: Cigarettes   Smokeless tobacco: Never  Vaping Use   Vaping Use: Never used  Substance and Sexual Activity   Alcohol use: Yes    Alcohol/week: 2.0 standard drinks    Types: 2 Cans of beer per week   Drug use: No   Sexual activity: Not on file  Other Topics Concern   Not on file  Social History Narrative   Not on file   Social Determinants of Health   Financial Resource Strain: Not on file  Food Insecurity: Not on file  Transportation Needs: Not on file  Physical Activity: Not on file  Stress: Not on file  Social Connections: Not on file    Her Allergies Are:  Allergies  Allergen Reactions   Erythromycin Other (See Comments)    REACTION: GI upset   Penicillins Nausea And Vomiting  :   Her Current Medications Are:  Outpatient Encounter Medications as of 10/25/2020  Medication Sig   busPIRone (BUSPAR) 5 MG tablet Take 1 tablet (5 mg total) by mouth 3 (three) times daily.   Cholecalciferol (VITAMIN D3) 50 MCG (2000 UT) TABS Take by mouth.   hydrochlorothiazide (HYDRODIURIL) 25 MG tablet Take 1 tablet (25 mg total) by mouth daily.   losartan (COZAAR) 100 MG tablet Take 1 tablet (100 mg total) by mouth daily.   rosuvastatin (CRESTOR) 10 MG tablet Take 1 tablet (10 mg total) by mouth daily.   sertraline (ZOLOFT) 100 MG tablet Take 1 tablet (100 mg total) by mouth daily.   traMADol (ULTRAM) 50 MG tablet TAKE 1 TABLET (50 MG TOTAL) BY MOUTH EVERY 8 (EIGHT) HOURS AS NEEDED. FOR PAIN   No facility-administered encounter medications on file as of 10/25/2020.  :   Review of Systems:  Out of a complete 14 point review of systems, all are reviewed and negative with the exception of these symptoms as listed below:  Review of Systems  Neurological:        Here consult on worsening memory loss. Hx of covid in 2020  was hospitalized for covid along with sepsis in 2021. Pt reports since these two events her memory has severely declined.     Objective:  Neurological Exam  Physical Exam Physical Examination:   Vitals:   10/25/20 1412  BP: (!) 160/81  Pulse: 73    General Examination: The patient is a very pleasant 70 y.o. female in no acute distress. She appears thin, tearful at times.  Well groomed.   HEENT: Normocephalic, atraumatic, pupils are  equal, round and reactive to light, extraocular tracking is well-preserved, hearing grossly intact, face is symmetric with normal facial animation and speech is clear without dysarthria, hypophonia or voice tremor.  Airway examination reveals moderate mouth dryness.  Tongue protrudes centrally and palate elevates symmetrically.  No carotid bruits.  Neck is supple with full range of motion.   Chest: Clear to auscultation without wheezing, rhonchi or crackles noted.  Heart: S1+S2+0, regular and normal without murmurs, rubs or gallops noted.   Abdomen: Soft, non-tender and non-distended with normal bowel sounds appreciated on auscultation.  Extremities: There is no pitting edema in the distal lower extremities bilaterally. Pedal pulses are intact.  Skin: Warm and dry without trophic changes noted.  She has multiple scabs and scars over her forearms and dorsum of the hands.  She reports having a little puppy.  Musculoskeletal: exam reveals arthritic changes in both hands, particularly right thumb joint.   Neurologically:  Mental status: The patient is awake, alert and oriented in all 4 spheres. Her immediate and remote memory, attention, language skills and fund of knowledge are appropriate. There is no evidence of aphasia, agnosia, apraxia or anomia. Speech is clear with normal prosody and enunciation. Thought process is linear. Mood is normal and affect is blunted.   On 10/25/2020: MMSE: 26/30 (she lost 2 points on serial sevens, one-point on remote recall,  one-point on comprehension when taking the paper into the wrong hand), CDT: 4/4, AFT: 11/min. Cranial nerves II - XII are as described above under HEENT exam. In addition: shoulder shrug is normal with equal shoulder height noted. Motor exam: Normal bulk, strength and tone is noted. There is no drift, tremor or rebound. Romberg is negative. Reflexes are 3+ throughout. Babinski: Toes are flexor bilaterally. Fine motor skills and coordination: intact with normal finger taps, normal hand movements, normal rapid alternating patting, normal foot taps and normal foot agility.  Cerebellar testing: No dysmetria or intention tremor on finger to nose testing. Heel to shin is unremarkable bilaterally. There is no truncal or gait ataxia.  Sensory exam: intact to light touch,vibration, and temperature sense in the upper and lower extremities bilaterally.    Gait, station and balance: She stands easily. No veering to one side is noted. No leaning to one side is noted. Posture is age-appropriate and stance is narrow based. Gait shows normal stride length and normal pace. No problems turning are noted.  Assessment and Plan:   In summary, Taneal J Clover is a very pleasant 70 y.o.-year old female with an underlying medical history of hypertension, allergies, anxiety, depression, and excessive alcohol consumption, who presents for evaluation of her memory loss of 4 to 5 years duration.  She feels that her symptoms have become worse in the recent few months.  She has lost weight and she has had increase in stress and marital discord as well as increase in anxiety and depression.  These can all be contributors, in addition, she has a history of alcohol consumption on a daily basis.  She admits to drinking alcohol daily for the past 1+ year but quit 3 to 4 weeks ago altogether on her own she reports.  She has been in group therapy for counseling.  She has not seen a psychiatrist yet and does report residual anxiety and depression.   We talked about memory loss, its etiology and risk factors at length today.  She does have vascular risk factors in particular smoking.  We talked about the importance of smoking cessation and  she is encouraged to talk to you about help with smoking cessation.  She is advised to stay well-hydrated with water and we talked about the importance of good nutrition and maintaining a healthy weight.  She has gained a few pounds recently but had lost weight in the recent past due to appetite loss.  She had recent blood work.  I would like to pursue a brain MRI, she had a brain MRI without contrast in 2020.  Further contributors could be memory loss in the context of COVID infection.  She had COVID in 2020.  She also reports a recent hospitalization with sepsis.  She was hospitalized in August 2021 with pyelonephritis and sepsis.  She does not have a telltale family history of Alzheimer's dementia.  She is advised to consider evaluation with a neuropsychologist with more formal memory evaluation.  She is agreeable.  She may benefit from optimization of her anxiety and depression management.  She is advised that we will reconvene after her brain MRI and her neuropsychology evaluation.  I did not recommend any new medications from my end of things quite yet.  We also talked about further lifestyle modifications and the importance of all these aspects that could affect her memory.  We will plan a follow-up in about 3 months, sooner if needed.  I answered all her questions today and she was in agreement.   Thank you very much for allowing me to participate in the care of this nice patient. If I can be of any further assistance to you please do not hesitate to call me at [phone].  Sincerely,   Star Age, MD, PhD

## 2020-10-25 NOTE — Telephone Encounter (Signed)
Referral for neuropsychology sent to [facility] neurology.  Phone: [phone].

## 2020-10-25 NOTE — Patient Instructions (Signed)
You have complaints of memory loss: memory loss or changes in cognitive function can have many reasons and does not always mean you have dementia. Conditions that can contribute to subjective or objective memory loss include: poor sleep from insomnia or sleep apnea, dehydration, fluctuation in blood sugar values, thyroid or electrolyte dysfunction, mood issues, such as anxiety, depression, and stress, and certain vitamin deficiencies. Dementia can be caused by stroke, brain atherosclerosis or brain vascular disease due to vascular risk factors (smoking, high blood pressure, high cholesterol, obesity and uncontrolled diabetes), certain degenerative brain disorders (including Parkinson's disease and Multiple sclerosis) and by Alzheimer's disease or other, more rare and sometimes hereditary causes. We will do some additional testing: blood work (which has been done recently already) and we will do a brain scan. We will not start medication as yet. We will also request a formal cognitive test called neuropsychological evaluation which is done by a licensed neuropsychologist. We will make a referral in that regard. We will call you with brain scan test results and monitor your symptoms. Your memory loss is mild at this point, which, of course is reassuring.

## 2020-10-27 ENCOUNTER — Telehealth: Payer: Self-pay | Admitting: Neurology

## 2020-10-27 NOTE — Telephone Encounter (Signed)
[employer] approved MRI brain w/wo contrast. Auth: [id number] (10/27/20- 11/26/20). Sent to [place]. They will call patient to schedule. Phone: [phone].

## 2020-10-28 ENCOUNTER — Encounter: Payer: Self-pay | Admitting: Counselor

## 2020-10-29 DIAGNOSIS — F419 Anxiety disorder, unspecified: Secondary | ICD-10-CM | POA: Diagnosis not present

## 2020-10-29 DIAGNOSIS — F1020 Alcohol dependence, uncomplicated: Secondary | ICD-10-CM | POA: Diagnosis not present

## 2020-10-29 DIAGNOSIS — F331 Major depressive disorder, recurrent, moderate: Secondary | ICD-10-CM | POA: Diagnosis not present

## 2020-11-05 DIAGNOSIS — F331 Major depressive disorder, recurrent, moderate: Secondary | ICD-10-CM | POA: Diagnosis not present

## 2020-11-05 DIAGNOSIS — F419 Anxiety disorder, unspecified: Secondary | ICD-10-CM | POA: Diagnosis not present

## 2020-11-05 DIAGNOSIS — F1020 Alcohol dependence, uncomplicated: Secondary | ICD-10-CM | POA: Diagnosis not present

## 2020-11-06 ENCOUNTER — Other Ambulatory Visit: Payer: Self-pay | Admitting: Internal Medicine

## 2020-11-06 NOTE — Telephone Encounter (Signed)
Requested Prescriptions  Pending Prescriptions Disp Refills  . sertraline (ZOLOFT) 50 MG tablet [Pharmacy Med Name: SERTRALINE HCL 50 MG TABLET] 45 tablet     Sig: TAKE 1/2 TABLET BY MOUTH EVERY DAY     Psychiatry:  Antidepressants - SSRI Passed - 11/06/2020  3:02 PM      Passed - Completed PHQ-2 or PHQ-9 in the last 360 days      Passed - Valid encounter within last 6 months    Recent Outpatient Visits          1 month ago Anxiety and depression   [facility] [place], Coralie Keens, NP      Future Appointments            In 1 month [place], Coralie Keens, NP [facility], [facility]           . traMADol (ULTRAM) 50 MG tablet [Pharmacy Med Name: TRAMADOL HCL 50 MG TABLET] 21 tablet     Sig: TAKE 1 TABLET BY MOUTH EVERY 8 HOURS AS NEEDED FOR PAIN     Not Delegated - Analgesics:  Opioid Agonists Failed - 11/06/2020  3:02 PM      Failed - This refill cannot be delegated      Failed - Urine Drug Screen completed in last 360 days      Passed - Valid encounter within last 6 months    Recent Outpatient Visits          1 month ago Anxiety and depression   [place], Coralie Keens, NP      Future Appointments            In 1 month [place], Coralie Keens, NP [facility]           . hydrochlorothiazide (HYDRODIURIL) 25 MG tablet [Pharmacy Med Name: HYDROCHLOROTHIAZIDE 25 MG TAB] 90 tablet     Sig: TAKE 1 TABLET BY MOUTH EVERY DAY     Cardiovascular: Diuretics - Thiazide Failed - 11/06/2020  3:02 PM      Failed - Last BP in normal range    BP Readings from Last 1 Encounters:  10/25/20 (!) 160/81         Passed - Ca in normal range and within 360 days    Calcium  Date Value Ref Range Status  07/29/2020 9.7 8.4 - 10.5 mg/dL Final         Passed - Cr in normal range and within 360 days    Creatinine, Ser  Date Value Ref Range Status  07/29/2020 0.72 0.40 - 1.20 mg/dL Final         Passed - K in normal range and within 360  days    Potassium  Date Value Ref Range Status  07/29/2020 4.6 3.5 - 5.1 mEq/L Final         Passed - Na in normal range and within 360 days    Sodium  Date Value Ref Range Status  07/29/2020 138 135 - 145 mEq/L Final         Passed - Valid encounter within last 6 months    Recent Outpatient Visits          1 month ago Anxiety and depression   [place], Coralie Keens, NP      Future Appointments            In 1 month [place], Coralie Keens, NP [facility],  [facility]           '

## 2020-11-06 NOTE — Telephone Encounter (Signed)
Requested medication (s) are due for refill today: yes to both  Requested medication (s) are on the active medication list: yes to both  Last refill:  Tramadol: 07/22/20 #30          HCTZ: 07/01/19 #90 1 RF  Future visit scheduled: yes  Notes to clinic:  Tramadol is not delegated to NT to RF Pt has been off HCTZ - please review   Requested Prescriptions  Pending Prescriptions Disp Refills   traMADol (ULTRAM) 50 MG tablet [Pharmacy Med Name: TRAMADOL HCL 50 MG TABLET] 21 tablet     Sig: TAKE 1 TABLET BY MOUTH EVERY 8 HOURS AS NEEDED FOR PAIN      Not Delegated - Analgesics:  Opioid Agonists Failed - 11/06/2020  3:02 PM      Failed - This refill cannot be delegated      Failed - Urine Drug Screen completed in last 360 days      Passed - Valid encounter within last 6 months    Recent Outpatient Visits           1 month ago Anxiety and depression   [place], Coralie Keens, NP       Future Appointments             In 1 month [place], Coralie Keens, NP [facility], [facility]               hydrochlorothiazide (HYDRODIURIL) 25 MG tablet [Pharmacy Med Name: HYDROCHLOROTHIAZIDE 25 MG TAB] 90 tablet     Sig: TAKE 1 TABLET BY MOUTH EVERY DAY      Cardiovascular: Diuretics - Thiazide Failed - 11/06/2020  3:02 PM      Failed - Last BP in normal range    BP Readings from Last 1 Encounters:  10/25/20 (!) 160/81          Passed - Ca in normal range and within 360 days    Calcium  Date Value Ref Range Status  07/29/2020 9.7 8.4 - 10.5 mg/dL Final          Passed - Cr in normal range and within 360 days    Creatinine, Ser  Date Value Ref Range Status  07/29/2020 0.72 0.40 - 1.20 mg/dL Final          Passed - K in normal range and within 360 days    Potassium  Date Value Ref Range Status  07/29/2020 4.6 3.5 - 5.1 mEq/L Final          Passed - Na in normal range and within 360 days    Sodium  Date Value Ref Range Status  07/29/2020 138 135 -  145 mEq/L Final          Passed - Valid encounter within last 6 months    Recent Outpatient Visits           1 month ago Anxiety and depression   [facility] [place], Coralie Keens, NP       Future Appointments             In 1 month [place], Coralie Keens, NP [facility], [facility]              Refused Prescriptions Disp Refills   sertraline (ZOLOFT) 50 MG tablet [Pharmacy Med Name: SERTRALINE HCL 50 MG TABLET] 45 tablet     Sig: TAKE 1/2 TABLET BY Lane      Psychiatry:  Antidepressants - SSRI Passed - 11/06/2020  3:02 PM      Passed - Completed PHQ-2 or PHQ-9 in the last 360 days      Passed - Valid encounter within last 6 months    Recent Outpatient Visits           1 month ago Anxiety and depression   [place], Coralie Keens, NP       Future Appointments             In 1 month [place], Coralie Keens, NP [facility]

## 2020-11-07 ENCOUNTER — Ambulatory Visit
Admission: RE | Admit: 2020-11-07 | Discharge: 2020-11-07 | Disposition: A | Discharge status: Home / Self Care | Payer: Medicare HMO | Source: Ambulatory Visit | Attending: Neurology | Admitting: Neurology

## 2020-11-07 ENCOUNTER — Other Ambulatory Visit: Payer: Self-pay

## 2020-11-07 DIAGNOSIS — F419 Anxiety disorder, unspecified: Secondary | ICD-10-CM

## 2020-11-07 DIAGNOSIS — R413 Other amnesia: Secondary | ICD-10-CM

## 2020-11-07 DIAGNOSIS — F439 Reaction to severe stress, unspecified: Secondary | ICD-10-CM

## 2020-11-07 DIAGNOSIS — F17200 Nicotine dependence, unspecified, uncomplicated: Secondary | ICD-10-CM

## 2020-11-07 MED ORDER — GADOBENATE DIMEGLUMINE 529 MG/ML IV SOLN
10.0000 mL | Freq: Once | INTRAVENOUS | Status: AC | PRN
  Administered 2020-11-07: 10 mL via INTRAVENOUS

## 2020-11-08 ENCOUNTER — Telehealth: Payer: Self-pay

## 2020-11-08 NOTE — Telephone Encounter (Signed)
-----   Message from Star Age, MD sent at 11/08/2020 12:20 PM EDT ----- Please call patient and advise her that her brain MRI with and without contrast from 11/07/2020 showed no acute findings, mild volume loss was noted, mildly progressed compared to 2020.  Normal contrast uptake.  We will proceed with neuropsychological evaluation as scheduled for October.  Please advise her that I would like to push our follow-up appointment out till after she has seen Dr. Nicole Kindred, she is currently scheduled with me for September.  Please ask if she is agreeable to seeing me in November instead.

## 2020-11-08 NOTE — Telephone Encounter (Signed)
I called the pt and was unable to reach her vm was full and not accepting new messages.  Will CB at a later time.

## 2020-11-08 NOTE — Progress Notes (Signed)
Please call patient and advise her that her brain MRI with and without contrast from 11/07/2020 showed no acute findings, mild volume loss was noted, mildly progressed compared to 2020.  Normal contrast uptake.  We will proceed with neuropsychological evaluation as scheduled for October.  Please advise her that I would like to push our follow-up appointment out till after she has seen Dr. Nicole Kindred, she is currently scheduled with me for September.  Please ask if she is agreeable to seeing me in November instead.

## 2020-11-09 NOTE — Telephone Encounter (Signed)
I called the pt and was unable to reach her vm was full and not accepting new messages.   Will CB at a later time. This was the second attempt to reach the pt.

## 2020-11-22 NOTE — Telephone Encounter (Signed)
Pt showed up today for f/u appt that was c/a. Sts she did not receive the my chart message about MRI results of appt change.  To note vm was full at TE calls on 6/28/ and 6/27.  Pt, husband and I verbally reviewed results of MRI. Pt verbalized understanding. I relayed appt scheduled for Neuro psych in Oct and pt sts this was a conflict in her schedule due to pt's son getting married.  Pt was provided with the number to the neuropsych department so she could r/s.  Once appt has been r/s, pt will call back and we will proceed with scheduling her f/u appt.

## 2020-12-13 ENCOUNTER — Other Ambulatory Visit: Payer: Self-pay | Admitting: Internal Medicine

## 2020-12-15 ENCOUNTER — Other Ambulatory Visit: Payer: Self-pay | Admitting: Internal Medicine

## 2020-12-16 MED ORDER — SERTRALINE HCL 100 MG PO TABS: 100.0000 mg | ORAL_TABLET | Freq: Every day | ORAL | 0 refills | Status: DC

## 2020-12-16 NOTE — Telephone Encounter (Signed)
How often is she taking this?

## 2020-12-16 NOTE — Telephone Encounter (Signed)
[facility] called and spoke to [facility], [facility] about the refill(s) HCTZ and Sertraline 50 mg requested. Advised HCTZ was sent on 11/08/20 #90/0 refill(s) and Sertraline 50 mg was changed to 100 mg and sent on 10/01/20 #90/0 refills. She says they have received both of them and there are no refills, so the system will automatically keep requesting. She says the Sertraline 50 mg will be inactivated out their system, so no more requests will be sent. She says the patient will need the refill on Sertraline 100 mg in a few days.

## 2020-12-16 NOTE — Telephone Encounter (Signed)
Requested medication (s) are due for refill today: Yes  Requested medication (s) are on the active medication list: Yes  Last refill:  11/06/20  Future visit scheduled: Yes  Notes to clinic:  Unable to refill per protocol, cannot delegate.      Requested Prescriptions  Pending Prescriptions Disp Refills   traMADol (ULTRAM) 50 MG tablet [Pharmacy Med Name: TRAMADOL HCL 50 MG TABLET] 30 tablet 0    Sig: TAKE 1 TABLET BY MOUTH EVERY 8 HOURS AS NEEDED FOR PAIN. NOT COVERED BY INSURANCE.      Not Delegated - Analgesics:  Opioid Agonists Failed - 12/15/2020  7:29 PM      Failed - This refill cannot be delegated      Failed - Urine Drug Screen completed in last 360 days      Passed - Valid encounter within last 6 months    Recent Outpatient Visits           2 months ago Anxiety and depression   [place], Coralie Keens, NP       Future Appointments             In 2 weeks [place], Coralie Keens, NP [facility], [facility]              Signed Prescriptions Disp Refills   hydrochlorothiazide (HYDRODIURIL) 25 MG tablet 90 tablet 0    Sig: TAKE 1 TABLET BY MOUTH EVERY DAY      Cardiovascular: Diuretics - Thiazide Failed - 12/15/2020  7:29 PM      Failed - Last BP in normal range    BP Readings from Last 1 Encounters:  10/25/20 (!) 160/81          Passed - Ca in normal range and within 360 days    Calcium  Date Value Ref Range Status  07/29/2020 9.7 8.4 - 10.5 mg/dL Final          Passed - Cr in normal range and within 360 days    Creatinine, Ser  Date Value Ref Range Status  07/29/2020 0.72 0.40 - 1.20 mg/dL Final          Passed - K in normal range and within 360 days    Potassium  Date Value Ref Range Status  07/29/2020 4.6 3.5 - 5.1 mEq/L Final          Passed - Na in normal range and within 360 days    Sodium  Date Value Ref Range Status  07/29/2020 138 135 - 145 mEq/L Final          Passed - Valid encounter within last 6  months    Recent Outpatient Visits           2 months ago Anxiety and depression   [place], Coralie Keens, NP       Future Appointments             In 2 weeks Jear[place], NP [facility], [facility]               sertraline (ZOLOFT) 100 MG tablet 90 tablet 0    Sig: Take 1 tablet (100 mg total) by mouth daily.      There is no refill protocol information for this order     Refused Prescriptions Disp Refills   sertraline (ZOLOFT) 50 MG tablet [Pharmacy Med Name: SERTRALINE HCL 50 MG TABLET] 45 tablet 0  Sig: TAKE 1/2 TABLET BY MOUTH EVERY DAY      Psychiatry:  Antidepressants - SSRI Passed - 12/15/2020  7:29 PM      Passed - Completed PHQ-2 or PHQ-9 in the last 360 days      Passed - Valid encounter within last 6 months    Recent Outpatient Visits           2 months ago Anxiety and depression   [facility] [place], Coralie Keens, NP       Future Appointments             In 2 weeks [place], Coralie Keens, NP [facility]

## 2020-12-31 DIAGNOSIS — F331 Major depressive disorder, recurrent, moderate: Secondary | ICD-10-CM | POA: Diagnosis not present

## 2020-12-31 DIAGNOSIS — F1020 Alcohol dependence, uncomplicated: Secondary | ICD-10-CM | POA: Diagnosis not present

## 2020-12-31 DIAGNOSIS — F419 Anxiety disorder, unspecified: Secondary | ICD-10-CM | POA: Diagnosis not present

## 2021-01-04 ENCOUNTER — Ambulatory Visit (INDEPENDENT_AMBULATORY_CARE_PROVIDER_SITE_OTHER): Payer: Medicare HMO | Admitting: Internal Medicine

## 2021-01-04 ENCOUNTER — Other Ambulatory Visit: Payer: Self-pay

## 2021-01-04 ENCOUNTER — Encounter: Payer: Self-pay | Admitting: Internal Medicine

## 2021-01-04 VITALS — BP 134/52 | HR 65 | Temp 97.7°F | Resp 17 | Ht 59.0 in | Wt 109.4 lb

## 2021-01-04 DIAGNOSIS — E7800 Pure hypercholesterolemia, unspecified: Secondary | ICD-10-CM | POA: Diagnosis not present

## 2021-01-04 DIAGNOSIS — Z0000 Encounter for general adult medical examination without abnormal findings: Secondary | ICD-10-CM | POA: Diagnosis not present

## 2021-01-04 LAB — LIPID PANEL
Cholesterol: 222 mg/dL — ABNORMAL HIGH (ref <200)
HDL: 44 mg/dL — ABNORMAL LOW (ref >=50)
LDL Cholesterol (Calc): 145 mg/dL (calc) — ABNORMAL HIGH
Non-HDL Cholesterol (Calc): 178 mg/dL (calc) — ABNORMAL HIGH (ref <130)
Total CHOL/HDL Ratio: 5 (calc) — ABNORMAL HIGH (ref <5.0)
Triglycerides: 191 mg/dL — ABNORMAL HIGH (ref <150)

## 2021-01-04 NOTE — Progress Notes (Signed)
HPI:  Patient presents to clinic today for her subsequent annual Medicare wellness exam.  Past Medical History:  Diagnosis Date   Allergy    Anxiety    Hypertension     Current Outpatient Medications  Medication Sig Dispense Refill   busPIRone (BUSPAR) 5 MG tablet Take 1 tablet (5 mg total) by mouth 3 (three) times daily. 90 tablet 2   Cholecalciferol (VITAMIN D3) 50 MCG (2000 UT) TABS Take by mouth.     hydrochlorothiazide (HYDRODIURIL) 25 MG tablet TAKE 1 TABLET BY MOUTH EVERY DAY 90 tablet 0   losartan (COZAAR) 100 MG tablet Take 1 tablet (100 mg total) by mouth daily. 90 tablet 3   rosuvastatin (CRESTOR) 10 MG tablet Take 1 tablet (10 mg total) by mouth daily. 90 tablet 2   sertraline (ZOLOFT) 100 MG tablet Take 1 tablet (100 mg total) by mouth daily. 90 tablet 0   traMADol (ULTRAM) 50 MG tablet TAKE 1 TABLET BY MOUTH EVERY 8 HOURS AS NEEDED FOR PAIN 30 tablet 0   No current facility-administered medications for this visit.    Allergies  Allergen Reactions   Erythromycin Other (See Comments)    REACTION: GI upset   Penicillins Nausea And Vomiting    Family History  Problem Relation Age of Onset   Diabetes Mother    Stroke Mother    Other Mother        colon sugery   Polymyalgia rheumatica Mother    Heart disease Father    Diabetes Father    Colon cancer Neg Hx    Cancer Neg Hx     Social History   Socioeconomic History   Marital status: Married    Spouse name: Not on file   Number of children: 2   Years of education: Not on file   Highest education level: Not on file  Occupational History   Occupation: self employed    Employer: OTHER  Tobacco Use   Smoking status: Every Day    Packs/day: 0.75    Types: Cigarettes   Smokeless tobacco: Never  Vaping Use   Vaping Use: Never used  Substance and Sexual Activity   Alcohol use: Yes    Alcohol/week: 2.0 standard drinks    Types: 2 Cans of beer per week   Drug use: No   Sexual activity: Not on file   Other Topics Concern   Not on file  Social History Narrative   Not on file   Social Determinants of Health   Financial Resource Strain: Not on file  Food Insecurity: Not on file  Transportation Needs: Not on file  Physical Activity: Not on file  Stress: Not on file  Social Connections: Not on file  Intimate Partner Violence: Not on file    Hospitiliaztions: None  Health Maintenance:    Flu: 02/2019  Tetanus: 07/2013  Pneumovax: 02/2019  Prevnar: 10/2017  Zostavax: Never:  Shingrix: 02/2019  COVID: [place] x2  Mammogram: >2 years ago  Pap Smear: 11/2012  Bone Density: Never  Colon Screening: 04/2013  Eye Doctor: as needed  Dental Exam: biannually   Providers:   PCP: Webb Silversmith, NP  Neurologist: Dr. Rexene Alberts   I have personally reviewed and have noted:  1. The patient's medical and social history 2. Their use of alcohol, tobacco or illicit drugs 3. Their current medications and supplements 4. The patient's functional ability including ADL's, fall risks, home safety risks and hearing or visual impairment. 5. Diet and physical activities 6. Evidence  for depression or mood disorder  Subjective:   Review of Systems:   Constitutional: Denies fever, malaise, fatigue, headache or abrupt weight changes.  HEENT: Denies eye pain, eye redness, ear pain, ringing in the ears, wax buildup, runny nose, nasal congestion, bloody nose, or sore throat. Respiratory: Denies difficulty breathing, shortness of breath, cough or sputum production.   Cardiovascular: Denies chest pain, chest tightness, palpitations or swelling in the hands or feet.  Gastrointestinal: Patient reports loose stools.  Denies abdominal pain, bloating, constipation, or blood in the stool.  GU: Denies urgency, frequency, pain with urination, burning sensation, blood in urine, odor or discharge. Musculoskeletal: Denies decrease in range of motion, difficulty with gait, muscle pain or joint pain and swelling.   Skin: Denies redness, rashes, lesions or ulcercations.  Neurological: Patient has a history of memory impairment.  Denies dizziness, difficulty with speech or problems with balance and coordination.  Psych: Patient has a history of anxiety and depression.  Denies SI/HI.  No other specific complaints in a complete review of systems (except as listed in HPI above).  Objective:  PE:  BP (!) 134/52 (BP Location: Right Arm, Patient Position: Sitting, Cuff Size: Normal)   Pulse 65   Temp 97.7 F (36.5 C) (Temporal)   Resp 17   Ht '4\' 11"'$  (1.499 m)   Wt 109 lb 6.4 oz (49.6 kg)   SpO2 100%   BMI 22.10 kg/m   Wt Readings from Last 3 Encounters:  10/25/20 113 lb (51.3 kg)  10/01/20 111 lb 3.2 oz (50.4 kg)  07/29/20 119 lb (54 kg)    General: Appears her stated age, well developed, well nourished in NAD. Skin: Warm, dry and intact.  Fire ant bites noted bilateral ankles. HEENT: Head: normal shape and size; Eyes: sclera white and EOMs intact;  Neck: Neck supple, trachea midline. No masses, lumps or thyromegaly present.  Cardiovascular: Normal rate and rhythm. S1,S2 noted.  No murmur, rubs or gallops noted. No JVD or BLE edema. No carotid bruits noted. Pulmonary/Chest: Normal effort and positive vesicular breath sounds. No respiratory distress. No wheezes, rales or ronchi noted.  Abdomen: Soft and nontender. Normal bowel sounds. No distention or masses noted. Liver, spleen and kidneys non palpable. Musculoskeletal: Strength 5/5 BUE/BLE. Neurological: Alert and oriented. Cranial nerves II-XII grossly intact. Coordination normal.  Psychiatric: Mood and affect normal. Behavior is normal. Judgment and thought content normal.    BMET    Component Value Date/Time   NA 138 07/29/2020 1601   K 4.6 07/29/2020 1601   CL 103 07/29/2020 1601   CO2 27 07/29/2020 1601   GLUCOSE 94 07/29/2020 1601   BUN 11 07/29/2020 1601   CREATININE 0.72 07/29/2020 1601   CALCIUM 9.7 07/29/2020 1601    GFRNONAA >60 01/01/2020 0405   GFRAA >60 01/01/2020 0405    Lipid Panel     Component Value Date/Time   CHOL 187 05/13/2019 1228   TRIG 169.0 (H) 05/13/2019 1228   HDL 41.90 05/13/2019 1228   CHOLHDL 4 05/13/2019 1228   VLDL 33.8 05/13/2019 1228   LDLCALC 111 (H) 05/13/2019 1228    CBC    Component Value Date/Time   WBC 7.7 07/29/2020 1601   RBC 3.72 (L) 07/29/2020 1601   HGB 12.3 07/29/2020 1601   HCT 36.1 07/29/2020 1601   PLT 238.0 07/29/2020 1601   MCV 97.1 07/29/2020 1601   MCH 32.4 12/31/2019 0904   MCHC 34.1 07/29/2020 1601   RDW 15.1 07/29/2020 1601   LYMPHSABS  1.5 12/31/2019 0904   MONOABS 1.0 12/31/2019 0904   EOSABS 0.1 12/31/2019 0904   BASOSABS 0.0 12/31/2019 0904    Hgb A1C Lab Results  Component Value Date   HGBA1C 5.7 04/13/2015      Assessment and Plan:   Medicare Annual Wellness Visit:  Diet: She does eat meat. She consume fruits and veggies. She does eat some fried foods. She drinks mostly unsweet green tea. Physical activity: Gardening Depression/mood screen: Chronic, PHQ 9 score of 10 Hearing: Intact to whispered voice Visual acuity: Grossly normal ADLs: Capable Fall risk: None Home safety: Good Cognitive evaluation: Trouble with recall.  Intact to orientation, naming, and repetition EOL planning: No adv directives, full code/ I agree  Preventative Medicine: Encouraged her to get a flu shot in fall.  Tetanus, Pneumovax, Prevnar UTD.  She will get her second Shingrix vaccine at the pharmacy.   Encouraged her to get her COVID booster.  She declines Pap smear, mammogram, bone density or colon screening today.  Encouraged her to consume a balanced diet and exercise regimen.  Advised her to see an eye doctor and dentist annually.  Will check lipid profile today.  Due dates for screening exam given to patient as part of her ADLs.   Next appointment: 1 year Medicare wellness exam   Webb Silversmith, NP This visit occurred during the SARS-CoV-2  public health emergency.  Safety protocols were in place, including screening questions prior to the visit, additional usage of staff PPE, and extensive cleaning of exam room while observing appropriate contact time as indicated for disinfecting solutions.

## 2021-01-04 NOTE — Patient Instructions (Signed)

## 2021-01-06 DIAGNOSIS — F331 Major depressive disorder, recurrent, moderate: Secondary | ICD-10-CM | POA: Diagnosis not present

## 2021-01-06 DIAGNOSIS — F419 Anxiety disorder, unspecified: Secondary | ICD-10-CM | POA: Diagnosis not present

## 2021-01-06 DIAGNOSIS — F1020 Alcohol dependence, uncomplicated: Secondary | ICD-10-CM | POA: Diagnosis not present

## 2021-01-24 ENCOUNTER — Ambulatory Visit: Payer: Medicare HMO | Admitting: Neurology

## 2021-01-25 ENCOUNTER — Encounter: Payer: Self-pay | Admitting: Counselor

## 2021-01-28 DIAGNOSIS — F1020 Alcohol dependence, uncomplicated: Secondary | ICD-10-CM | POA: Diagnosis not present

## 2021-01-28 DIAGNOSIS — F331 Major depressive disorder, recurrent, moderate: Secondary | ICD-10-CM | POA: Diagnosis not present

## 2021-01-28 DIAGNOSIS — F419 Anxiety disorder, unspecified: Secondary | ICD-10-CM | POA: Diagnosis not present

## 2021-02-03 ENCOUNTER — Other Ambulatory Visit: Payer: Self-pay | Admitting: Internal Medicine

## 2021-02-11 ENCOUNTER — Encounter: Payer: Self-pay | Admitting: Gastroenterology

## 2021-02-25 ENCOUNTER — Encounter: Payer: Medicare HMO | Admitting: Counselor

## 2021-02-25 ENCOUNTER — Other Ambulatory Visit: Payer: Self-pay | Admitting: Internal Medicine

## 2021-02-26 NOTE — Telephone Encounter (Signed)
Requested medications are due for refill today yes  Requested medications are on the active medication list yes  Last refill 02/03/21  Last visit 01/04/21  Future visit scheduled no  Notes to clinic was recently seen, unsure why was only given one month supply, please assess.  Requested Prescriptions  Pending Prescriptions Disp Refills   busPIRone (BUSPAR) 5 MG tablet [Pharmacy Med Name: BUSPIRONE HCL 5 MG TABLET] 270 tablet 1    Sig: TAKE 1 TABLET BY MOUTH THREE TIMES A DAY     Psychiatry: Anxiolytics/Hypnotics - Non-controlled Passed - 02/25/2021  1:32 PM      Passed - Valid encounter within last 6 months    Recent Outpatient Visits           1 month ago Medicare annual wellness visit, subsequent   [facility] [place], Coralie Keens, NP   4 months ago Anxiety and depression   [facility] [place], Coralie Keens, NP

## 2021-03-02 ENCOUNTER — Encounter: Payer: Medicare HMO | Admitting: Counselor

## 2021-03-04 ENCOUNTER — Other Ambulatory Visit: Payer: Self-pay

## 2021-03-04 ENCOUNTER — Encounter: Payer: Medicare HMO | Admitting: Counselor

## 2021-03-09 ENCOUNTER — Encounter: Payer: Medicare HMO | Admitting: Counselor

## 2021-03-28 ENCOUNTER — Telehealth: Payer: Self-pay | Admitting: *Deleted

## 2021-03-28 ENCOUNTER — Encounter: Payer: Self-pay | Admitting: Neurology

## 2021-03-28 ENCOUNTER — Ambulatory Visit: Payer: Self-pay | Admitting: Neurology

## 2021-03-28 NOTE — Telephone Encounter (Signed)
Per Dr. Rexene Alberts ,If patient wants to reschedule please reschedule after appointment with Dr. Hazle Coca on 08/31/2021.

## 2021-03-31 ENCOUNTER — Other Ambulatory Visit: Payer: Self-pay | Admitting: Internal Medicine

## 2021-03-31 NOTE — Telephone Encounter (Signed)
Requested Prescriptions  Pending Prescriptions Disp Refills  . hydrochlorothiazide (HYDRODIURIL) 25 MG tablet [Pharmacy Med Name: HYDROCHLOROTHIAZIDE 25 MG TAB] 90 tablet 0    Sig: TAKE 1 TABLET BY MOUTH EVERY DAY     Cardiovascular: Diuretics - Thiazide Passed - 03/31/2021 11:12 AM      Passed - Ca in normal range and within 360 days    Calcium  Date Value Ref Range Status  07/29/2020 9.7 8.4 - 10.5 mg/dL Final         Passed - Cr in normal range and within 360 days    Creatinine, Ser  Date Value Ref Range Status  07/29/2020 0.72 0.40 - 1.20 mg/dL Final         Passed - K in normal range and within 360 days    Potassium  Date Value Ref Range Status  07/29/2020 4.6 3.5 - 5.1 mEq/L Final         Passed - Na in normal range and within 360 days    Sodium  Date Value Ref Range Status  07/29/2020 138 135 - 145 mEq/L Final         Passed - Last BP in normal range    BP Readings from Last 1 Encounters:  01/04/21 (!) 134/52         Passed - Valid encounter within last 6 months    Recent Outpatient Visits          2 months ago Medicare annual wellness visit, subsequent   [place], Coralie Keens, NP   6 months ago Anxiety and depression   [place], [place], NP             . sertraline (ZOLOFT) 100 MG tablet [Pharmacy Med Name: SERTRALINE HCL 100 MG TABLET] 90 tablet 0    Sig: TAKE 1 TABLET BY MOUTH EVERY DAY     Psychiatry:  Antidepressants - SSRI Passed - 03/31/2021 11:12 AM      Passed - Completed PHQ-2 or PHQ-9 in the last 360 days      Passed - Valid encounter within last 6 months    Recent Outpatient Visits          2 months ago Medicare annual wellness visit, subsequent   [facility] [place], Coralie Keens, NP   6 months ago Anxiety and depression   [facility] [place], Coralie Keens, [place]

## 2021-04-14 ENCOUNTER — Telehealth: Payer: Self-pay

## 2021-04-14 NOTE — Telephone Encounter (Signed)
Pt was a NS for PV.  Attempted to reach x 3--VM not set up so message not able to be left.

## 2021-04-14 NOTE — Telephone Encounter (Signed)
No call by EOB to r/s PV.  NS letter sent with procedure cancelled.

## 2021-04-28 ENCOUNTER — Encounter: Payer: Medicare HMO | Admitting: Gastroenterology

## 2021-05-10 ENCOUNTER — Encounter: Payer: Self-pay | Admitting: Gastroenterology

## 2021-05-13 ENCOUNTER — Encounter: Payer: Self-pay | Admitting: Internal Medicine

## 2021-05-13 ENCOUNTER — Ambulatory Visit (INDEPENDENT_AMBULATORY_CARE_PROVIDER_SITE_OTHER): Payer: Medicare HMO | Admitting: Internal Medicine

## 2021-05-13 ENCOUNTER — Other Ambulatory Visit: Payer: Self-pay

## 2021-05-13 VITALS — BP 142/68 | HR 74 | Temp 97.8°F | Resp 17 | Ht 59.0 in | Wt 104.0 lb

## 2021-05-13 DIAGNOSIS — F419 Anxiety disorder, unspecified: Secondary | ICD-10-CM | POA: Diagnosis not present

## 2021-05-13 DIAGNOSIS — R413 Other amnesia: Secondary | ICD-10-CM | POA: Diagnosis not present

## 2021-05-13 DIAGNOSIS — R197 Diarrhea, unspecified: Secondary | ICD-10-CM | POA: Diagnosis not present

## 2021-05-13 DIAGNOSIS — K591 Functional diarrhea: Secondary | ICD-10-CM | POA: Diagnosis not present

## 2021-05-13 DIAGNOSIS — F32A Depression, unspecified: Secondary | ICD-10-CM

## 2021-05-13 NOTE — Progress Notes (Signed)
Subjective:    Patient ID: [name] [name], female    DOB: 1950/11/28, 70 y.o.   MRN: [id number]  HPI  Patient presents the clinic today with complaint of diarrhea.  She reports this started 7 months ago.  She reports the need to have a bowel movement anywhere from 10 minutes to 1 hour after eating.  She reports her stools are loose and watery. She denies abdominal pain, cramping, nausea, vomiting or blood in her stool. She denies recent changes in medication. She is under a lot of stress, has a history of anxiety and depression, currently managed on Sertraline and Buspar. Colonoscopy from 04/2013 reviewed. She has taken Imodium OTC with some relief of symptoms.  She is also concerned about memory loss. She has difficulty with short term memory and recall. She had a MRI brain 08/2020 which showed:  IMPRESSION: This MRI of the brain with and without contrast shows the following: 1.  Mild generalized cortical atrophy, mildly progressed compared to the 03/22/2019 MRI. 2.  Some scattered punctate T2/FLAIR hyperintense foci in the hemispheres consistent with minimal chronic microvascular ischemic change, stable compared to the 2020 MRI. 3.  No acute findings. 4.  Normal enhancement pattern.  She is not currently taking anything for her memory. She has a neurology appt scheduled for 08/2021.  Review of Systems  Past Medical History:  Diagnosis Date   Allergy    Anxiety    Hypertension     Current Outpatient Medications  Medication Sig Dispense Refill   busPIRone (BUSPAR) 5 MG tablet TAKE 1 TABLET BY MOUTH THREE TIMES A DAY 270 tablet 1   Cholecalciferol (VITAMIN D3) 50 MCG (2000 UT) TABS Take by mouth.     hydrochlorothiazide (HYDRODIURIL) 25 MG tablet TAKE 1 TABLET BY MOUTH EVERY DAY 90 tablet 0   losartan (COZAAR) 100 MG tablet Take 1 tablet (100 mg total) by mouth daily. 90 tablet 3   rosuvastatin (CRESTOR) 10 MG tablet Take 1 tablet (10 mg total) by mouth daily. 90 tablet 2   sertraline  (ZOLOFT) 100 MG tablet TAKE 1 TABLET BY MOUTH EVERY DAY 90 tablet 0   traMADol (ULTRAM) 50 MG tablet TAKE 1 TABLET BY MOUTH EVERY 8 HOURS AS NEEDED FOR PAIN 30 tablet 0   No current facility-administered medications for this visit.    Allergies  Allergen Reactions   Erythromycin Other (See Comments)    REACTION: GI upset   Penicillins Nausea And Vomiting    Family History  Problem Relation Age of Onset   Diabetes Mother    Stroke Mother    Other Mother        colon sugery   Polymyalgia rheumatica Mother    Heart disease Father    Diabetes Father    Colon cancer Neg Hx    Cancer Neg Hx     Social History   Socioeconomic History   Marital status: Married    Spouse name: Not on file   Number of children: 2   Years of education: Not on file   Highest education level: Not on file  Occupational History   Occupation: self employed    Employer: OTHER  Tobacco Use   Smoking status: Every Day    Packs/day: 0.75    Types: Cigarettes   Smokeless tobacco: Never  Vaping Use   Vaping Use: Never used  Substance and Sexual Activity   Alcohol use: Yes    Alcohol/week: 2.0 standard drinks    Types: 2 Cans  of beer per week    Comment: occassionally   Drug use: No   Sexual activity: Not on file  Other Topics Concern   Not on file  Social History Narrative   Not on file   Social Determinants of Health   Financial Resource Strain: Not on file  Food Insecurity: Not on file  Transportation Needs: Not on file  Physical Activity: Not on file  Stress: Not on file  Social Connections: Not on file  Intimate Partner Violence: Not on file     Constitutional: Denies fever, malaise, fatigue, headache or abrupt weight changes.  Respiratory: Denies difficulty breathing, shortness of breath, cough or sputum production.   Cardiovascular: Denies chest pain, chest tightness, palpitations or swelling in the hands or feet.  Gastrointestinal: Patient reports diarrhea.  Denies abdominal  pain, bloating, constipation, or blood in the stool.  GU: Denies urgency, frequency, pain with urination, burning sensation, blood in urine, odor or discharge. Musculoskeletal: Denies decrease in range of motion, difficulty with gait, muscle pain or joint pain and swelling.  Skin: Denies redness, rashes, lesions or ulcercations.  Neurological: Pt reports difficulty with memory. Denies dizziness, difficulty with speech or problems with balance and coordination.  Psych: Pt reports stress, anxiety and depression. Denies SI/HI.  No other specific complaints in a complete review of systems (except as listed in HPI above).     Objective:   Physical Exam  BP (!) 142/68    Pulse 74    Temp 97.8 F (36.6 C) (Temporal)    Resp 17    Ht 4\' 11"  (1.499 m)    Wt 104 lb (47.2 kg)    SpO2 100%    BMI 21.01 kg/m   Wt Readings from Last 3 Encounters:  01/04/21 109 lb 6.4 oz (49.6 kg)  10/25/20 113 lb (51.3 kg)  10/01/20 111 lb 3.2 oz (50.4 kg)    General: Appears her stated age, well developed, well nourished in NAD. Neck:  Neck supple, trachea midline. No masses, lumps or thyromegaly present.  Cardiovascular: Normal rate and rhythm.  Pulmonary/Chest: Normal effort and positive vesicular breath sounds.  Abdomen: Normal bowel sounds.  Neurological: Alert and oriented. MMSE score of 27/30. Psychiatric: Mood and affect normal. Behavior is normal. Judgment and thought content normal.   BMET    Component Value Date/Time   NA 138 07/29/2020 1601   K 4.6 07/29/2020 1601   CL 103 07/29/2020 1601   CO2 27 07/29/2020 1601   GLUCOSE 94 07/29/2020 1601   BUN 11 07/29/2020 1601   CREATININE 0.72 07/29/2020 1601   CALCIUM 9.7 07/29/2020 1601   GFRNONAA >60 01/01/2020 0405   GFRAA >60 01/01/2020 0405    Lipid Panel     Component Value Date/Time   CHOL 222 (H) 01/04/2021 1356   TRIG 191 (H) 01/04/2021 1356   HDL 44 (L) 01/04/2021 1356   CHOLHDL 5.0 (H) 01/04/2021 1356   VLDL 33.8 05/13/2019 1228    LDLCALC 145 (H) 01/04/2021 1356    CBC    Component Value Date/Time   WBC 7.7 07/29/2020 1601   RBC 3.72 (L) 07/29/2020 1601   HGB 12.3 07/29/2020 1601   HCT 36.1 07/29/2020 1601   PLT 238.0 07/29/2020 1601   MCV 97.1 07/29/2020 1601   MCH 32.4 12/31/2019 0904   MCHC 34.1 07/29/2020 1601   RDW 15.1 07/29/2020 1601   LYMPHSABS 1.5 12/31/2019 0904   MONOABS 1.0 12/31/2019 0904   EOSABS 0.1 12/31/2019 0904   BASOSABS  0.0 12/31/2019 0904    Hgb A1C Lab Results  Component Value Date   HGBA1C 5.7 04/13/2015           Assessment & Plan:    Diarrhea:  Likely functional Could be a side effect of Sertraline but would not want to stop this due to benefit for anxiety and depression Will check TSH, CBC, CMET and GI Pathogen panel Could be related to stress/anxiety/depression Continue Imodium OTC as needed  Memory Impairment:  MMSE of 27/30 MRI brain reviewed Would avoid started Donepazil while she has diarrhea as this medication could make this worse Will have her follow up with neurology as previously planned  Will follow up after labs with further recommendation and treatment plan Webb Silversmith, NP This visit occurred during the SARS-CoV-2 public health emergency.  Safety protocols were in place, including screening questions prior to the visit, additional usage of staff PPE, and extensive cleaning of exam room while observing appropriate contact time as indicated for disinfecting solutions.

## 2021-05-13 NOTE — Patient Instructions (Signed)
Diarrhea, Adult °Diarrhea is when you pass loose and watery poop (stool) often. Diarrhea can make you feel weak and cause you to lose water in your body (get dehydrated). Losing water in your body can cause you to: °Feel tired and thirsty. °Have a dry mouth. °Go pee (urinate) less often. °Diarrhea often lasts 2-3 days. However, it can last longer if it is a sign of something more serious. It is important to treat your diarrhea as told by your doctor. °Follow these instructions at home: °Eating and drinking °  °Follow these instructions as told by your doctor: °Take an ORS (oral rehydration solution). This is a drink that helps you replace fluids and minerals your body lost. It is sold at pharmacies and stores. °Drink plenty of fluids, such as: °Water. °Ice chips. °Diluted fruit juice. °Low-calorie sports drinks. °Milk, if you want. °Avoid drinking fluids that have a lot of sugar or caffeine in them. °Eat bland, easy-to-digest foods in small amounts as you are able. These foods include: °Bananas. °Applesauce. °Rice. °Low-fat (lean) meats. °Toast. °Crackers. °Avoid alcohol. °Avoid spicy or fatty foods. ° °Medicines °Take over-the-counter and prescription medicines only as told by your doctor. °If you were prescribed an antibiotic medicine, take it as told by your doctor. Do not stop using the antibiotic even if you start to feel better. °General instructions ° °Wash your hands often using soap and water. If soap and water are not available, use a hand sanitizer. Others in your home should wash their hands as well. Hands should be washed: °After using the toilet or changing a diaper. °Before preparing, cooking, or serving food. °While caring for a sick person. °While visiting someone in a hospital. °Drink enough fluid to keep your pee (urine) pale yellow. °Rest at home while you get better. °Take a warm bath to help with any burning or pain from having diarrhea. °Watch your condition for any changes. °Keep all  follow-up visits as told by your doctor. This is important. °Contact a doctor if: °You have a fever. °Your diarrhea gets worse. °You have new symptoms. °You cannot keep fluids down. °You feel light-headed or dizzy. °You have a headache. °You have muscle cramps. °Get help right away if: °You have chest pain. °You feel very weak or you pass out (faint). °You have bloody or black poop or poop that looks like tar. °You have very bad pain, cramping, or bloating in your belly (abdomen). °You have trouble breathing or you are breathing very quickly. °Your heart is beating very quickly. °Your skin feels cold and clammy. °You feel confused. °You have signs of losing too much water in your body, such as: °Dark pee, very little pee, or no pee. °Cracked lips. °Dry mouth. °Sunken eyes. °Sleepiness. °Weakness. °Summary °Diarrhea is when you pass loose and watery poop (stool) often. °Diarrhea can make you feel weak and cause you to lose water in your body (get dehydrated). °Take an ORS (oral rehydration solution). This is a drink that is sold at pharmacies and stores. °Eat bland, easy-to-digest foods in small amounts as you are able. °Contact a doctor if your condition gets worse. Get help right away if you have signs that you have lost too much water in your body. °This information is not intended to replace advice given to you by your health care provider. Make sure you discuss any questions you have with your health care provider. °Document Revised: 11/10/2020 Document Reviewed: 11/10/2020 °Elsevier Patient Education © 2022 [employer] ° °

## 2021-05-14 LAB — COMPLETE METABOLIC PANEL WITHOUT GFR
AG Ratio: 1.6 (calc) (ref 1.0–2.5)
ALT: 8 U/L (ref 6–29)
AST: 17 U/L (ref 10–35)
Albumin: 4.1 g/dL (ref 3.6–5.1)
Alkaline phosphatase (APISO): 60 U/L (ref 37–153)
BUN: 13 mg/dL (ref 7–25)
CO2: 26 mmol/L (ref 20–32)
Calcium: 9.6 mg/dL (ref 8.6–10.4)
Chloride: 105 mmol/L (ref 98–110)
Creat: 0.71 mg/dL (ref 0.50–1.05)
Globulin: 2.5 g/dL (ref 1.9–3.7)
Glucose, Bld: 84 mg/dL (ref 65–139)
Potassium: 4.2 mmol/L (ref 3.5–5.3)
Sodium: 138 mmol/L (ref 135–146)
Total Bilirubin: 0.5 mg/dL (ref 0.2–1.2)
Total Protein: 6.6 g/dL (ref 6.1–8.1)
eGFR: 93 mL/min/1.73m2

## 2021-05-14 LAB — CBC
HCT: 39.7 % (ref 35.0–45.0)
Hemoglobin: 13.3 g/dL (ref 11.7–15.5)
MCH: 32.6 pg (ref 27.0–33.0)
MCHC: 33.5 g/dL (ref 32.0–36.0)
MCV: 97.3 fL (ref 80.0–100.0)
MPV: 11.4 fL (ref 7.5–12.5)
Platelets: 262 10*3/uL (ref 140–400)
RBC: 4.08 10*6/uL (ref 3.80–5.10)
RDW: 13.1 % (ref 11.0–15.0)
WBC: 8.1 10*3/uL (ref 3.8–10.8)

## 2021-05-14 LAB — TSH: TSH: 2.65 mIU/L (ref 0.40–4.50)

## 2021-05-19 DIAGNOSIS — F419 Anxiety disorder, unspecified: Secondary | ICD-10-CM | POA: Diagnosis not present

## 2021-05-19 DIAGNOSIS — R197 Diarrhea, unspecified: Secondary | ICD-10-CM | POA: Diagnosis not present

## 2021-05-19 DIAGNOSIS — F32A Depression, unspecified: Secondary | ICD-10-CM | POA: Diagnosis not present

## 2021-05-19 DIAGNOSIS — K591 Functional diarrhea: Secondary | ICD-10-CM | POA: Diagnosis not present

## 2021-05-20 LAB — GASTROINTESTINAL PATHOGEN PANEL PCR
C. difficile Tox A/B, PCR: NOT DETECTED
Campylobacter, PCR: NOT DETECTED
Cryptosporidium, PCR: NOT DETECTED
E coli (ETEC) LT/ST PCR: NOT DETECTED
E coli (STEC) stx1/stx2, PCR: NOT DETECTED
E coli 0157, PCR: NOT DETECTED
Giardia lamblia, PCR: NOT DETECTED
Norovirus, PCR: NOT DETECTED
Rotavirus A, PCR: NOT DETECTED
Salmonella, PCR: NOT DETECTED
Shigella, PCR: NOT DETECTED

## 2021-06-02 ENCOUNTER — Encounter: Payer: Self-pay | Admitting: Gastroenterology

## 2021-06-02 ENCOUNTER — Other Ambulatory Visit: Payer: Medicare HMO

## 2021-06-02 ENCOUNTER — Ambulatory Visit: Payer: Medicare HMO | Admitting: Gastroenterology

## 2021-06-02 VITALS — BP 130/80 | HR 67 | Ht 59.0 in | Wt 108.0 lb

## 2021-06-02 DIAGNOSIS — K529 Noninfective gastroenteritis and colitis, unspecified: Secondary | ICD-10-CM

## 2021-06-02 DIAGNOSIS — K625 Hemorrhage of anus and rectum: Secondary | ICD-10-CM

## 2021-06-02 DIAGNOSIS — R634 Abnormal weight loss: Secondary | ICD-10-CM

## 2021-06-02 MED ORDER — PLENVU 140 G PO SOLR: 140.0000 g | ORAL | 0 refills | Status: DC

## 2021-06-02 NOTE — Patient Instructions (Addendum)
If you are age 69 or older, your body mass index should be between 23-30. Your Body mass index is 21.81 kg/m. If this is out of the aforementioned range listed, please consider follow up with your Primary Care Provider.  If you are age 29 or younger, your body mass index should be between 19-25. Your Body mass index is 21.81 kg/m. If this is out of the aformentioned range listed, please consider follow up with your Primary Care Provider.   ________________________________________________________  The [facility] providers would like to encourage you to use [facility] to communicate with providers for non-urgent requests or questions.  Due to long hold times on the telephone, sending your provider a message by [facility] may be a faster and more efficient way to get a response.  Please allow 48 business hours for a response.  Please remember that this is for non-urgent requests.  _______________________________________________________  Dennis Bast have been scheduled for a colonoscopy. Please follow written instructions given to you at your visit today.  Please pick up your prep supplies at the pharmacy within the next 1-3 days. If you use inhalers (even only as needed), please bring them with you on the day of your procedure.  Your provider has requested that you go to the basement level for lab work before leaving today. Press "B" on the elevator. The lab is located at the first door on the left as you exit the elevator.    Due to recent changes in healthcare laws, you may see the results of your imaging and laboratory studies on MyChart before your provider has had a chance to review them.  We understand that in some cases there may be results that are confusing or concerning to you. Not all laboratory results come back in the same time frame and the provider may be waiting for multiple results in order to interpret others.  Please give [place] 48 hours in order for your provider to thoroughly review all the  results before contacting the office for clarification of your results.   It was a pleasure to see you today!  Thank you for trusting me with your gastrointestinal care!

## 2021-06-02 NOTE — Progress Notes (Addendum)
[facility] Consult Note:  History: [name] [name] 06/02/2021  Referring provider: Jearld Fenton, NP  Reason for consult/chief complaint: Diarrhea (Constant diarrhea with some occasional bleeding, but believes it due to hemorrhoids and irritation from diarrhea.  ) and Weight Loss (In the past patient states that her weight normally is around 130's. Now weights in at 104.  )   Subjective  HPI: From recent PCP visit: "Patient presents the clinic today with complaint of diarrhea.  She reports this started 7 months ago.  She reports the need to have a bowel movement anywhere from 10 minutes to 1 hour after eating.  She reports her stools are loose and watery. She denies abdominal pain, cramping, nausea, vomiting or blood in her stool. She denies recent changes in medication. She is under a lot of stress, has a history of anxiety and depression, currently managed on Sertraline and Buspar. Colonoscopy from 04/2013 reviewed. She has taken Imodium OTC with some relief of symptoms. " ____________  [name] reports chronic diarrhea for at least the last year, and possibly since and admission for sepsis in August 2021.  She is having some difficulty recalling events and dates because of memory trouble that has also occurred after that illness.  She describes 4-6 loose BMs per day with urgency.  No abdominal pain, but has bloating and "rumbling".  She has nocturnal diarrhea, and stools are characterized as watery and sometimes a "film" on top, but it does not sound greasy or foul-smelling. There is been some intermittent bleeding that she believes is likely hemorrhoidal.  She also had substantial weight loss since the sepsis hospitalization about 18 months ago, but also says she had multiple subsequent episodes of COVID and all of that caused her to lose weight.  Her weight has since stabilized in the last couple of months, and in fact she put on several pounds in the last couple of  weeks. She is a longtime smoker and has no known prior history of pancreatitis.  Colonoscopy report [name] [name] 2014 - internal HR, TA x 2 small (No show for 04/14/21 direct colonoscopy nurse pre-visit) ROS:  Review of Systems  Constitutional:  Positive for fatigue. Negative for appetite change and unexpected weight change.       Night sweats Insomnia  HENT:  Negative for mouth sores and voice change.   Eyes:  Negative for pain and redness.  Respiratory:  Negative for cough and shortness of breath.   Cardiovascular:  Negative for chest pain and palpitations.  Genitourinary:  Negative for dysuria and hematuria.  Musculoskeletal:  Negative for arthralgias and myalgias.  Skin:  Negative for pallor and rash.  Neurological:  Negative for weakness and headaches.  Hematological:  Negative for adenopathy.  Psychiatric/Behavioral:         Memory loss Depression    Past Medical History: Past Medical History:  Diagnosis Date   Allergy    Anxiety    Hypertension      Past Surgical History: Past Surgical History:  Procedure Laterality Date   ABLATION     uterine   ANAL FISSURE REPAIR     age 11 and college   COLONOSCOPY     DILATION AND CURETTAGE OF UTERUS     HEMORRHOID BANDING  06/02/2013   TONSILLECTOMY     age 57     Family History: Family History  Problem Relation Age of Onset   Diabetes Mother    Stroke Mother    Other Mother  colon sugery   Polymyalgia rheumatica Mother    Heart disease Father    Diabetes Father    Colon cancer Neg Hx    Cancer Neg Hx     Social History: Social History   Socioeconomic History   Marital status: Married    Spouse name: Not on file   Number of children: 2   Years of education: Not on file   Highest education level: Not on file  Occupational History   Occupation: self employed    Employer: OTHER  Tobacco Use   Smoking status: Every Day    Packs/day: 0.75    Types: Cigarettes   Smokeless tobacco: Never  Vaping Use    Vaping Use: Never used  Substance and Sexual Activity   Alcohol use: Yes    Alcohol/week: 2.0 standard drinks    Types: 2 Cans of beer per week    Comment: occassionally   Drug use: No   Sexual activity: Not Currently    Birth control/protection: Post-menopausal  Other Topics Concern   Not on file  Social History Narrative   Not on file   Social Determinants of Health   Financial Resource Strain: Not on file  Food Insecurity: Not on file  Transportation Needs: Not on file  Physical Activity: Not on file  Stress: Not on file  Social Connections: Not on file    Allergies: Allergies  Allergen Reactions   Erythromycin Other (See Comments)    REACTION: GI upset   Penicillins Nausea And Vomiting    Outpatient Meds: Current Outpatient Medications  Medication Sig Dispense Refill   busPIRone (BUSPAR) 5 MG tablet TAKE 1 TABLET BY MOUTH THREE TIMES A DAY 270 tablet 1   Cholecalciferol (VITAMIN D3) 50 MCG (2000 UT) TABS Take by mouth.     hydrochlorothiazide (HYDRODIURIL) 25 MG tablet TAKE 1 TABLET BY MOUTH EVERY DAY 90 tablet 0   losartan (COZAAR) 100 MG tablet Take 1 tablet (100 mg total) by mouth daily. 90 tablet 3   PEG-KCl-NaCl-NaSulf-Na Asc-C (PLENVU) 140 g SOLR Take 140 g by mouth as directed. Manufacturer's coupon Universal coupon code:BIN: P2366821; GROUP: [id number]; PCN: [id number]; ID: [id number]; PAY NO MORE $50 1 each 0   Probiotic Product (TRUNATURE DIGESTIVE PROBIOTIC PO) Take by mouth.     rosuvastatin (CRESTOR) 10 MG tablet Take 1 tablet (10 mg total) by mouth daily. 90 tablet 2   sertraline (ZOLOFT) 100 MG tablet TAKE 1 TABLET BY MOUTH EVERY DAY 90 tablet 0   traMADol (ULTRAM) 50 MG tablet TAKE 1 TABLET BY MOUTH EVERY 8 HOURS AS NEEDED FOR PAIN 30 tablet 0   No current facility-administered medications for this visit.      ___________________________________________________________________ Objective   Exam:  BP 130/80    Pulse 67    Ht 4\' 11"  (1.499 m)    Wt  108 lb (49 kg)    SpO2 99%    BMI 21.81 kg/m  Wt Readings from Last 3 Encounters:  06/02/21 108 lb (49 kg)  05/13/21 104 lb (47.2 kg)  01/04/21 109 lb 6.4 oz (49.6 kg)   Note weight stable since August 2022.  General: Petite, thin, not ill-appearing. Eyes: sclera anicteric, no redness ENT: oral mucosa moist without lesions, no cervical or supraclavicular lymphadenopathy CV: RRR without murmur, S1/S2, no JVD, no peripheral edema Resp: clear to auscultation bilaterally, normal RR and effort noted GI: soft, no tenderness, with active bowel sounds. No guarding or palpable organomegaly noted.  No abdominal bruit Skin;  warm and dry, no rash or jaundice noted Neuro: awake, alert and oriented x 3. Normal gross motor function and fluent speech  Labs:  CBC Latest Ref Rng & Units 05/13/2021 07/29/2020 01/08/2020  WBC 3.8 - 10.8 Thousand/uL 8.1 7.7 10.1  Hemoglobin 11.7 - 15.5 g/dL 13.3 12.3 13.9  Hematocrit 35.0 - 45.0 % 39.7 36.1 41.4  Platelets 140 - 400 Thousand/uL 262 238.0 521.0(H)   CMP Latest Ref Rng & Units 05/13/2021 07/29/2020 01/08/2020  Glucose 65 - 139 mg/dL 84 94 69(L)  BUN 7 - 25 mg/dL 13 11 17   Creatinine 0.50 - 1.05 mg/dL 0.71 0.72 0.92  Sodium 135 - 146 mmol/L 138 138 136  Potassium 3.5 - 5.3 mmol/L 4.2 4.6 4.1  Chloride 98 - 110 mmol/L 105 103 95(L)  CO2 20 - 32 mmol/L 26 27 33(H)  Calcium 8.6 - 10.4 mg/dL 9.6 9.7 10.6(H)  Total Protein 6.1 - 8.1 g/dL 6.6 6.9 7.5  Total Bilirubin 0.2 - 1.2 mg/dL 0.5 0.5 0.4  Alkaline Phos 39 - 117 U/L - 53 74  AST 10 - 35 U/L 17 15 19   ALT 6 - 29 U/L 8 9 24    Nml TSH  Negative GI pathogen panel 05/19/21   Assessment: Encounter Diagnoses  Name Primary?   Chronic diarrhea Yes   Weight loss    Rectal bleeding     Over a year chronic diarrhea, abdominal bloating and some urgency but no pain.  Rectal bleeding that sounds likely to be hemorrhoidal triggered by diarrhea, and new onset IBD less likely at this age.  Microoscopic colitis  a possibility.  She had substantial weight loss due to severe and multiple subsequent illnesses, but that seems to have stabilized in recent months, making a small bowel or malabsorptive source of diarrhea less likely.  Longtime smoker, no chronic abdominal pain to suggest chronic pancreatitis, and the character of reported diarrhea is not typical for that.  Plan: Celiac serology.  Fecal elastase  As needed Imodium  Stop probiotic  Colonoscopy with biopsies.  She was agreeable after discussion of procedure and risks.  The benefits and risks of the planned procedure were described in detail with the patient or (when appropriate) their health care proxy.  Risks were outlined as including, but not limited to, bleeding, infection, perforation, adverse medication reaction leading to cardiac or pulmonary decompensation, pancreatitis (if ERCP).  The limitation of incomplete mucosal visualization was also discussed.  No guarantees or warranties were given.   Thank you for the courtesy of this consult.  Please call me with any questions or concerns.  Nelida Meuse III  CC: Referring provider noted above

## 2021-06-03 ENCOUNTER — Other Ambulatory Visit: Payer: Medicare HMO

## 2021-06-03 DIAGNOSIS — R634 Abnormal weight loss: Secondary | ICD-10-CM

## 2021-06-03 DIAGNOSIS — K625 Hemorrhage of anus and rectum: Secondary | ICD-10-CM | POA: Diagnosis not present

## 2021-06-03 DIAGNOSIS — K529 Noninfective gastroenteritis and colitis, unspecified: Secondary | ICD-10-CM

## 2021-06-03 LAB — IGA: Immunoglobulin A: 243 mg/dL (ref 70–320)

## 2021-06-03 LAB — TISSUE TRANSGLUTAMINASE, IGA: (tTG) Ab, IgA: <1 U/mL

## 2021-06-06 ENCOUNTER — Other Ambulatory Visit: Payer: Self-pay | Admitting: Gastroenterology

## 2021-06-08 ENCOUNTER — Encounter: Payer: Self-pay | Admitting: Gastroenterology

## 2021-06-08 ENCOUNTER — Ambulatory Visit (AMBULATORY_SURGERY_CENTER): Payer: Medicare HMO | Admitting: Gastroenterology

## 2021-06-08 VITALS — BP 109/67 | HR 56 | Temp 98.9°F | Resp 14 | Ht 59.0 in | Wt 108.0 lb

## 2021-06-08 DIAGNOSIS — K529 Noninfective gastroenteritis and colitis, unspecified: Secondary | ICD-10-CM

## 2021-06-08 DIAGNOSIS — K52832 Lymphocytic colitis: Secondary | ICD-10-CM | POA: Diagnosis not present

## 2021-06-08 DIAGNOSIS — D123 Benign neoplasm of transverse colon: Secondary | ICD-10-CM

## 2021-06-08 DIAGNOSIS — K625 Hemorrhage of anus and rectum: Secondary | ICD-10-CM | POA: Diagnosis not present

## 2021-06-08 MED ORDER — SODIUM CHLORIDE 0.9 % IV SOLN: 500.0000 mL | Freq: Once | INTRAVENOUS | Status: DC

## 2021-06-08 NOTE — Patient Instructions (Signed)
Please read handouts provided. Continue present medications. Await pathology results.   YOU HAD AN ENDOSCOPIC PROCEDURE TODAY AT THE [facility]:   Refer to the procedure report that was given to you for any specific questions about what was found during the examination.  If the procedure report does not answer your questions, please call your gastroenterologist to clarify.  If you requested that your care partner not be given the details of your procedure findings, then the procedure report has been included in a sealed envelope for you to review at your convenience later.  YOU SHOULD EXPECT: Some feelings of bloating in the abdomen. Passage of more gas than usual.  Walking can help get rid of the air that was put into your GI tract during the procedure and reduce the bloating. If you had a lower endoscopy (such as a colonoscopy or flexible sigmoidoscopy) you may notice spotting of blood in your stool or on the toilet paper. If you underwent a bowel prep for your procedure, you may not have a normal bowel movement for a few days.  Please Note:  You might notice some irritation and congestion in your nose or some drainage.  This is from the oxygen used during your procedure.  There is no need for concern and it should clear up in a day or so.  SYMPTOMS TO REPORT IMMEDIATELY:  Following lower endoscopy (colonoscopy or flexible sigmoidoscopy):  Excessive amounts of blood in the stool  Significant tenderness or worsening of abdominal pains  Swelling of the abdomen that is new, acute  Fever of 100F or higher   For urgent or emergent issues, a gastroenterologist can be reached at any hour by calling [phone]. Do not use MyChart messaging for urgent concerns.    DIET:  We do recommend a small meal at first, but then you may proceed to your regular diet.  Drink plenty of fluids but you should avoid alcoholic beverages for 24 hours.  ACTIVITY:  You should plan to take it easy  for the rest of today and you should NOT DRIVE or use heavy machinery until tomorrow (because of the sedation medicines used during the test).    FOLLOW UP: Our staff will call the number listed on your records 48-72 hours following your procedure to check on you and address any questions or concerns that you may have regarding the information given to you following your procedure. If we do not reach you, we will leave a message.  We will attempt to reach you two times.  During this call, we will ask if you have developed any symptoms of COVID 19. If you develop any symptoms (ie: fever, flu-like symptoms, shortness of breath, cough etc.) before then, please call [phone].  If you test positive for Covid 19 in the 2 weeks post procedure, please call and report this information to us.    If any biopsies were taken you will be contacted by phone or by letter within the next 1-3 weeks.  Please call us at [phone] if you have not heard about the biopsies in 3 weeks.    SIGNATURES/CONFIDENTIALITY: You and/or your care partner have signed paperwork which will be entered into your electronic medical record.  These signatures attest to the fact that that the information above on your After Visit Summary has been reviewed and is understood.  Full responsibility of the confidentiality of this discharge information lies with you and/or your care-partner.  

## 2021-06-08 NOTE — Progress Notes (Signed)
No changes to clinical history since GI office visit on 06/02/21.  The patient is appropriate for an endoscopic procedure in the ambulatory setting.

## 2021-06-08 NOTE — Progress Notes (Signed)
Pt's states no medical or surgical changes since previsit or office visit. VS by DT. °

## 2021-06-08 NOTE — Progress Notes (Signed)
Pt VSS in immediate PACU.  Report to PACU RN

## 2021-06-08 NOTE — Progress Notes (Signed)
Called to room to assist during endoscopic procedure.  Patient ID and intended procedure confirmed with present staff. Received instructions for my participation in the procedure from the performing physician.  

## 2021-06-08 NOTE — Op Note (Signed)
[place] Patient Name: [name] [name] Procedure Date: 06/08/2021 2:42 PM MRN: [id number] Endoscopist: Mallie Mussel L. Loletha Carrow , MD Age: 71 Referring MD:  Date of Birth: [birth year] Gender: Female Account #: [id number] Procedure:                Colonoscopy Indications:              Chronic diarrhea, Rectal bleeding Medicines:                Monitored Anesthesia Care Procedure:                Pre-Anesthesia Assessment:                           - Prior to the procedure, a History and Physical                            was performed, and patient medications and                            allergies were reviewed. The patient's tolerance of                            previous anesthesia was also reviewed. The risks                            and benefits of the procedure and the sedation                            options and risks were discussed with the patient.                            All questions were answered, and informed consent                            was obtained. Prior Anticoagulants: The patient has                            taken no previous anticoagulant or antiplatelet                            agents. ASA Grade Assessment: II - A patient with                            mild systemic disease. After reviewing the risks                            and benefits, the patient was deemed in                            satisfactory condition to undergo the procedure.                           After obtaining informed consent, the colonoscope  was passed under direct vision. Throughout the                            procedure, the patient's blood pressure, pulse, and                            oxygen saturations were monitored continuously. The                            Olympus PCF-H190DL ([id number]) Colonoscope was                            introduced through the anus and advanced to the the                            cecum, identified by  appendiceal orifice and                            ileocecal valve. The colonoscopy was performed with                            difficulty due to a redundant colon, significant                            looping and a tortuous colon. Successful completion                            of the procedure was aided by using manual                            pressure, straightening and shortening the scope to                            obtain bowel loop reduction and lavage. The patient                            tolerated the procedure well. The quality of the                            bowel preparation was good. The ileocecal valve,                            appendiceal orifice, and rectum were photographed.                            Glimpse of TI obtained (normal), but could not be                            intubated due to scope looping. Scope In: 2:53:05 PM Scope Out: 3:15:56 PM Scope Withdrawal Time: 0 hours 15 minutes 39 seconds  Total Procedure Duration: 0 hours 22 minutes 51 seconds  Findings:                 The perianal and  digital rectal examinations were                            normal.                           Normal mucosa was found in the entire colon.                            Biopsies for histology were taken with a cold                            forceps from the right colon and left colon for                            evaluation of microscopic colitis.                           A 4-5 mm polyp was found in the distal transverse                            colon. The polyp was sessile. The polyp was removed                            with a cold snare. Resection and retrieval were                            complete.                           Internal hemorrhoids were found. The hemorrhoids                            were small.                           The exam was otherwise without abnormality on                            direct and retroflexion  views. Complications:            No immediate complications. Estimated Blood Loss:     Estimated blood loss was minimal. Impression:               - Normal mucosa in the entire examined colon.                            Biopsied.                           - One 4-5 mm polyp in the distal transverse colon,                            removed with a cold snare. Resected and retrieved.                           -  Internal hemorrhoids.                           - The examination was otherwise normal on direct                            and retroflexion views. Recommendation:           - Patient has a contact number available for                            emergencies. The signs and symptoms of potential                            delayed complications were discussed with the                            patient. Return to normal activities tomorrow.                            Written discharge instructions were provided to the                            patient.                           - Resume previous diet.                           - Continue present medications.                           - Await pathology results.                           - Repeat colonoscopy is recommended for                            surveillance. The colonoscopy date will be                            determined after pathology results from today's                            exam become available for review. Juan Olthoff L. Loletha Carrow, MD 06/08/2021 3:20:48 PM This report has been signed electronically.

## 2021-06-10 ENCOUNTER — Telehealth: Payer: Self-pay | Admitting: *Deleted

## 2021-06-10 LAB — PANCREATIC ELASTASE, FECAL: Pancreatic Elastase-1, Stool: 329 mcg/g

## 2021-06-10 NOTE — Telephone Encounter (Signed)
Left message on f/u call 

## 2021-06-14 ENCOUNTER — Encounter: Payer: Self-pay | Admitting: Gastroenterology

## 2021-06-14 ENCOUNTER — Other Ambulatory Visit: Payer: Self-pay

## 2021-06-14 MED ORDER — BUDESONIDE 3 MG PO CPEP: ORAL_CAPSULE | ORAL | 2 refills | Status: DC

## 2021-06-16 ENCOUNTER — Telehealth: Payer: Self-pay

## 2021-06-16 ENCOUNTER — Telehealth: Payer: Self-pay | Admitting: Gastroenterology

## 2021-06-16 NOTE — Telephone Encounter (Signed)
Hawley Pavia (Key: [id number]) - [id number] Budesonide 3MG  dr capsules     Status: PA Response - Approved  Created: January 31st, 2023 [phone]  Sent: February 2nd, 2023  Open   Archive

## 2021-06-16 NOTE — Telephone Encounter (Signed)
Patient is returning your call.  

## 2021-06-16 NOTE — Telephone Encounter (Signed)
Returned call to patient, see 06/08/21 pathology result note for details.

## 2021-06-16 NOTE — Telephone Encounter (Signed)
Budesonide PA has been submitted with cover my meds. Will await response.

## 2021-07-01 ENCOUNTER — Other Ambulatory Visit: Payer: Self-pay

## 2021-07-01 MED ORDER — BUDESONIDE 3 MG PO CPEP: ORAL_CAPSULE | ORAL | 2 refills | Status: DC

## 2021-07-01 NOTE — Telephone Encounter (Signed)
Rx has been sent to [facility] as requested,

## 2021-07-01 NOTE — Telephone Encounter (Signed)
Inbound call from patient states over $500 at [facility]. States [facility] have it for $30. Requesting sent to [facility] on [address] in [place], [place]

## 2021-07-18 ENCOUNTER — Ambulatory Visit (INDEPENDENT_AMBULATORY_CARE_PROVIDER_SITE_OTHER): Payer: Medicare HMO | Admitting: Gastroenterology

## 2021-07-18 ENCOUNTER — Encounter: Payer: Self-pay | Admitting: Gastroenterology

## 2021-07-18 VITALS — BP 162/88 | HR 80 | Ht 59.0 in | Wt 103.0 lb

## 2021-07-18 DIAGNOSIS — K529 Noninfective gastroenteritis and colitis, unspecified: Secondary | ICD-10-CM | POA: Diagnosis not present

## 2021-07-18 DIAGNOSIS — K52832 Lymphocytic colitis: Secondary | ICD-10-CM | POA: Diagnosis not present

## 2021-07-18 MED ORDER — BUDESONIDE 3 MG PO CPEP: ORAL_CAPSULE | ORAL | 2 refills | Status: DC

## 2021-07-18 NOTE — Patient Instructions (Signed)
If you are age 69 or older, your body mass index should be between 23-30. Your Body mass index is 20.8 kg/m?Marland Kitchen If this is out of the aforementioned range listed, please consider follow up with your Primary Care Provider. ? ?If you are age 80 or younger, your body mass index should be between 19-25. Your Body mass index is 20.8 kg/m?Marland Kitchen If this is out of the aformentioned range listed, please consider follow up with your Primary Care Provider.  ? ?________________________________________________________ ? ?The [facility] providers would like to encourage you to use [facility] to communicate with providers for non-urgent requests or questions.  Due to long hold times on the telephone, sending your provider a message by [facility] may be a faster and more efficient way to get a response.  Please allow 48 business hours for a response.  Please remember that this is for non-urgent requests.  ?_______________________________________________________ ? ?Please let us know about budesonide. ? ?It was a pleasure to see you today! ? ?Thank you for trusting me with your gastrointestinal care!   ? ? ?

## 2021-07-18 NOTE — Progress Notes (Signed)
? ? ?[facility] Consult Note: ? ?History: ?Kaiyah J Soules ?07/18/2021 ? ?Referring provider: Jearld Fenton, NP ? ?Reason for consult/chief complaint: Lymphocytic colitis ? ?Subjective  ?HPI: ?Clair Gulling was seen in January for chronic diarrhea, colonoscopy 06/08/2021 complete exam to cecum, good prep, subcentimeter tubular adenoma and biopsies showed lymphocytic colitis.  Celiac antibody negative. ?She was prescribed budesonide 9 mg daily ? ?Jazilyn tells me today she did not start the budesonide because her usual pharmacy was $540 a month.  Unfortunately, we did not receive a call from her to discuss other options. ?Therefore, she continues to have about 6 BMs per day very loose and nonbloody, with urgency.  Appetite generally good, but our scale indicates she has lost about 5 pounds since I last saw her, and she says that friends have noticed and commented on that to her. ? ?(10 minutes late for today's visit) ?ROS: ? ?Denies chest pain or dyspnea ?Mood stable ?He is having difficulty with memory, says she is going for some testing today. ? ? ?Past Medical History: ?Past Medical History:  ?Diagnosis Date  ? Allergy   ? Anxiety   ? Depression   ? Hyperlipidemia   ? Hypertension   ? ? ? ?Past Surgical History: ?Past Surgical History:  ?Procedure Laterality Date  ? ABLATION    ? uterine  ? ANAL FISSURE REPAIR    ? age 58 and college  ? COLONOSCOPY    ? DILATION AND CURETTAGE OF UTERUS    ? HEMORRHOID BANDING  06/02/2013  ? TONSILLECTOMY    ? age 18  ? ? ? ?Family History: ?Family History  ?Problem Relation Age of Onset  ? Diabetes Mother   ? Stroke Mother   ? Other Mother   ?     colon sugery  ? Polymyalgia rheumatica Mother   ? Heart disease Father   ? Diabetes Father   ? Colon cancer Neg Hx   ? Cancer Neg Hx   ? Esophageal cancer Neg Hx   ? Pancreatic cancer Neg Hx   ? Stomach cancer Neg Hx   ? ? ?Social History: ?Social History  ? ?Socioeconomic History  ? Marital status: Married  ?  Spouse name: Not on file   ? Number of children: 2  ? Years of education: Not on file  ? Highest education level: Not on file  ?Occupational History  ? Occupation: self employed  ?  Employer: OTHER  ?Tobacco Use  ? Smoking status: Every Day  ?  Packs/day: 0.75  ?  Types: Cigarettes  ? Smokeless tobacco: Never  ?Vaping Use  ? Vaping Use: Former  ?Substance and Sexual Activity  ? Alcohol use: Not Currently  ?  Comment: occassionally  ? Drug use: No  ? Sexual activity: Not Currently  ?  Birth control/protection: Post-menopausal  ?Other Topics Concern  ? Not on file  ?Social History Narrative  ? Not on file  ? ?Social Determinants of Health  ? ?Financial Resource Strain: Not on file  ?Food Insecurity: Not on file  ?Transportation Needs: Not on file  ?Physical Activity: Not on file  ?Stress: Not on file  ?Social Connections: Not on file  ? ? ?Allergies: ?Allergies  ?Allergen Reactions  ? Erythromycin Other (See Comments)  ?  REACTION: GI upset  ? Penicillins Nausea And Vomiting  ? ? ?Outpatient Meds: ?Current Outpatient Medications  ?Medication Sig Dispense Refill  ? busPIRone (BUSPAR) 5 MG tablet TAKE 1 TABLET BY MOUTH THREE TIMES A DAY  270 tablet 1  ? Cholecalciferol (VITAMIN D3) 50 MCG (2000 UT) TABS Take 1 tablet by mouth daily.    ? hydrochlorothiazide (HYDRODIURIL) 25 MG tablet TAKE 1 TABLET BY MOUTH EVERY DAY 90 tablet 0  ? losartan (COZAAR) 100 MG tablet Take 1 tablet (100 mg total) by mouth daily. 90 tablet 3  ? Probiotic Product (TRUNATURE DIGESTIVE PROBIOTIC PO) Take 1 tablet by mouth daily at 2 PM.    ? rosuvastatin (CRESTOR) 10 MG tablet Take 1 tablet (10 mg total) by mouth daily. 90 tablet 2  ? sertraline (ZOLOFT) 100 MG tablet TAKE 1 TABLET BY MOUTH EVERY DAY 90 tablet 0  ? traMADol (ULTRAM) 50 MG tablet TAKE 1 TABLET BY MOUTH EVERY 8 HOURS AS NEEDED FOR PAIN 30 tablet 0  ? budesonide (ENTOCORT EC) 3 MG 24 hr capsule Take 3 capsules (9 mg total) by mouth once daily 90 capsule 2  ? ?No current facility-administered medications for  this visit.  ? ? ? ? ?___________________________________________________________________ ?Objective  ? ?Exam: ? ?BP (!) 162/88   Pulse 80   Ht '4\' 11"'$  (1.499 m)   Wt 103 lb (46.7 kg)   BMI 20.80 kg/m?  ?Wt Readings from Last 3 Encounters:  ?07/18/21 103 lb (46.7 kg)  ?06/08/21 108 lb (49 kg)  ?06/02/21 108 lb (49 kg)  ? ? ?General: Thin, otherwise well-appearing ?No additional exam, entire visit spent in review of of symptoms, results and plan ? ?Labs: ? ?Pathology reports as noted above and on file ? ?Assessment: ?Encounter Diagnoses  ?Name Primary?  ? Chronic diarrhea Yes  ? Lymphocytic colitis   ? ? ?Lymphocytic colitis causing chronic watery diarrhea, however she has not started treatment because it was cost prohibitive. ?Recommended she shop it around to some other pharmacies, as it really is the most effective treatment for this condition.  I asked her to communicate with me or my nurse by portal or phone call when she has checked around cost, and perhaps we can be flexible with dosing such as 6 mg or 3 mg a day, which would certainly be better than no treatment. ?Second line treatment (considerably less effective the budesonide) is Pepto-Bismol tablets ? ?She has been taking some liquid Imodium once a day and not found it very effective, but I suggested she should look at the dosing instructions and take it more frequently for better effect. ? ?We will then coordinate treatment plan and follow-up after we hear from her.  I told her we typically need control of this at least 2 months budesonide, then we will wean down the medication and assess for recurrence of diarrhea, using budesonide for periods of time as needed based on symptoms. ? ? ?23 minutes were spent on this encounter (including chart review, history/exam, counseling/coordination of care, and documentation) > 50% of that time was spent on counseling and coordination of care. ? ? ?Nelida Meuse III ? ?CC: Referring provider noted above ? ?

## 2021-08-18 ENCOUNTER — Other Ambulatory Visit: Payer: Self-pay | Admitting: Internal Medicine

## 2021-08-18 NOTE — Telephone Encounter (Signed)
Medication Refill - Medication:  ?busPIRone (BUSPAR) 5 MG tablet ?hydrochlorothiazide (HYDRODIURIL) 25 MG tablet ?losartan (COZAAR) 100 MG tablet ?sertraline (ZOLOFT) 100 MG tablet ?traMADol (ULTRAM) 50 MG tablet ?Has the patient contacted their pharmacy? No. ?Pt wants to [facility] all [facility] meds [facility] ?Preferred Pharmacy (with phone number or street name): [id number] - [place], [place]  ?Has the patient been seen for an appointment in the last year OR does the patient have an upcoming appointment? Yes.   ? ?Agent: Please be advised that RX refills may take up to 3 business days. We ask that you follow-up with your pharmacy. ? ? ?

## 2021-08-19 MED ORDER — BUSPIRONE HCL 5 MG PO TABS: 5.0000 mg | ORAL_TABLET | Freq: Three times a day (TID) | ORAL | 0 refills | Status: DC

## 2021-08-19 MED ORDER — SERTRALINE HCL 100 MG PO TABS: 100.0000 mg | ORAL_TABLET | Freq: Every day | ORAL | 0 refills | Status: DC

## 2021-08-19 MED ORDER — LOSARTAN POTASSIUM 100 MG PO TABS: 100.0000 mg | ORAL_TABLET | Freq: Every day | ORAL | 0 refills | Status: DC

## 2021-08-19 MED ORDER — HYDROCHLOROTHIAZIDE 25 MG PO TABS: 25.0000 mg | ORAL_TABLET | Freq: Every day | ORAL | 0 refills | Status: DC

## 2021-08-19 NOTE — Telephone Encounter (Signed)
Requested Prescriptions  ?Pending Prescriptions Disp Refills  ?? busPIRone (BUSPAR) 5 MG tablet 270 tablet 1  ?  Sig: Take 1 tablet (5 mg total) by mouth 3 (three) times daily.  ?  ? Psychiatry: Anxiolytics/Hypnotics - Non-controlled Passed - 08/18/2021  1:30 PM  ?  ?  Passed - Valid encounter within last 12 months  ?  Recent Outpatient Visits   ?      ? 3 months ago Functional diarrhea  ? [facility] [place], Coralie Keens, NP  ? 7 months ago Medicare annual wellness visit, subsequent  ? [facility] [place], [place] W, NP  ? 10 months ago Anxiety and depression  ? [facility] [place], [place] W, NP  ?  ?  ? ?  ?  ?  ?? hydrochlorothiazide (HYDRODIURIL) 25 MG tablet 90 tablet 0  ?  Sig: Take 1 tablet (25 mg total) by mouth daily.  ?  ? Cardiovascular: Diuretics - Thiazide Failed - 08/18/2021  1:30 PM  ?  ?  Failed - Last BP in normal range  ?  BP Readings from Last 1 Encounters:  ?07/18/21 (!) 162/88  ?   ?  ?  Passed - Cr in normal range and within 180 days  ?  Creat  ?Date Value Ref Range Status  ?05/13/2021 0.71 0.50 - 1.05 mg/dL Final  ?   ?  ?  Passed - K in normal range and within 180 days  ?  Potassium  ?Date Value Ref Range Status  ?05/13/2021 4.2 3.5 - 5.3 mmol/L Final  ?   ?  ?  Passed - Na in normal range and within 180 days  ?  Sodium  ?Date Value Ref Range Status  ?05/13/2021 138 135 - 146 mmol/L Final  ?   ?  ?  Passed - Valid encounter within last 6 months  ?  Recent Outpatient Visits   ?      ? 3 months ago Functional diarrhea  ? [facility] [place], Coralie Keens, NP  ? 7 months ago Medicare annual wellness visit, subsequent  ? [facility] [place], [place] W, NP  ? 10 months ago Anxiety and depression  ? [facility] [place], [place] W, NP  ?  ?  ? ?  ?  ?  ?? losartan (COZAAR) 100 MG tablet 90 tablet 3  ?  Sig: Take 1 tablet (100 mg total) by mouth daily.  ?  ? Cardiovascular:  Angiotensin Receptor Blockers Failed - 08/18/2021  1:30 PM   ?  ?  Failed - Last BP in normal range  ?  BP Readings from Last 1 Encounters:  ?07/18/21 (!) 162/88  ?   ?  ?  Passed - Cr in normal range and within 180 days  ?  Creat  ?Date Value Ref Range Status  ?05/13/2021 0.71 0.50 - 1.05 mg/dL Final  ?   ?  ?  Passed - K in normal range and within 180 days  ?  Potassium  ?Date Value Ref Range Status  ?05/13/2021 4.2 3.5 - 5.3 mmol/L Final  ?   ?  ?  Passed - Patient is not pregnant  ?  ?  Passed - Valid encounter within last 6 months  ?  Recent Outpatient Visits   ?      ? 3 months ago Functional diarrhea  ? [facility] [place], Coralie Keens, NP  ? 7  months ago Medicare annual wellness visit, subsequent  ? [facility] [place], [place] W, NP  ? 10 months ago Anxiety and depression  ? [facility] [place], [place] W, NP  ?  ?  ? ?  ?  ?  ?? sertraline (ZOLOFT) 100 MG tablet 90 tablet 0  ?  Sig: Take 1 tablet (100 mg total) by mouth daily.  ?  ? Not Delegated - Psychiatry:  Antidepressants - SSRI - sertraline Failed - 08/18/2021  1:30 PM  ?  ?  Failed - This refill cannot be delegated  ?  ?  Passed - AST in normal range and within 360 days  ?  AST  ?Date Value Ref Range Status  ?05/13/2021 17 10 - 35 U/L Final  ?   ?  ?  Passed - ALT in normal range and within 360 days  ?  ALT  ?Date Value Ref Range Status  ?05/13/2021 8 6 - 29 U/L Final  ?   ?  ?  Passed - Completed PHQ-2 or PHQ-9 in the last 360 days  ?  ?  Passed - Valid encounter within last 6 months  ?  Recent Outpatient Visits   ?      ? 3 months ago Functional diarrhea  ? [facility] [place], Coralie Keens, NP  ? 7 months ago Medicare annual wellness visit, subsequent  ? [facility] [place], [place] W, NP  ? 10 months ago Anxiety and depression  ? [facility] [place], [place] W, NP  ?  ?  ? ?  ?  ?  ?? traMADol (ULTRAM) 50 MG tablet 30 tablet 0  ?  Sig: Take 1 tablet (50 mg total) by mouth every 8 (eight) hours as needed. for pain  ?  ? Not Delegated -  Analgesics:  Opioid Agonists Failed - 08/18/2021  1:30 PM  ?  ?  Failed - This refill cannot be delegated  ?  ?  Failed - Urine Drug Screen completed in last 360 days  ?  ?  Failed - Valid encounter within last 3 months  ?  Recent Outpatient Visits   ?      ? 3 months ago Functional diarrhea  ? [facility] [place], Coralie Keens, NP  ? 7 months ago Medicare annual wellness visit, subsequent  ? [facility] [place], [place] W, NP  ? 10 months ago Anxiety and depression  ? [facility] [place], Coralie Keens, NP  ?  ?  ? ?  ?  ?  ? ? ?

## 2021-08-19 NOTE — Telephone Encounter (Signed)
Requested medication (s) are due for refill today: yes ? ?Requested medication (s) are on the active medication list: yes ? ?Last refill:  sertraline 03/31/21 #90/0, tramadol 11/08/20 #30/0 ? ?Future visit scheduled: no ? ?Notes to clinic:  Unable to refill per protocol, cannot delegate.  ? ? ?  ?Requested Prescriptions  ?Pending Prescriptions Disp Refills  ? hydrochlorothiazide (HYDRODIURIL) 25 MG tablet 90 tablet 0  ?  Sig: Take 1 tablet (25 mg total) by mouth daily.  ?  ? Cardiovascular: Diuretics - Thiazide Failed - 08/18/2021  1:30 PM  ?  ?  Failed - Last BP in normal range  ?  BP Readings from Last 1 Encounters:  ?07/18/21 (!) 162/88  ?  ?  ?  ?  Passed - Cr in normal range and within 180 days  ?  Creat  ?Date Value Ref Range Status  ?05/13/2021 0.71 0.50 - 1.05 mg/dL Final  ?  ?  ?  ?  Passed - K in normal range and within 180 days  ?  Potassium  ?Date Value Ref Range Status  ?05/13/2021 4.2 3.5 - 5.3 mmol/L Final  ?  ?  ?  ?  Passed - Na in normal range and within 180 days  ?  Sodium  ?Date Value Ref Range Status  ?05/13/2021 138 135 - 146 mmol/L Final  ?  ?  ?  ?  Passed - Valid encounter within last 6 months  ?  Recent Outpatient Visits   ? ?      ? 3 months ago Functional diarrhea  ? [facility] [place], Coralie Keens, NP  ? 7 months ago Medicare annual wellness visit, subsequent  ? [facility] [place], [place] W, NP  ? 10 months ago Anxiety and depression  ? [facility] [place], [place] W, NP  ? ?  ?  ? ?  ?  ?  ? sertraline (ZOLOFT) 100 MG tablet 90 tablet 0  ?  Sig: Take 1 tablet (100 mg total) by mouth daily.  ?  ? Not Delegated - Psychiatry:  Antidepressants - SSRI - sertraline Failed - 08/18/2021  1:30 PM  ?  ?  Failed - This refill cannot be delegated  ?  ?  Passed - AST in normal range and within 360 days  ?  AST  ?Date Value Ref Range Status  ?05/13/2021 17 10 - 35 U/L Final  ?  ?  ?  ?  Passed - ALT in normal range and within 360 days  ?  ALT  ?Date Value Ref Range  Status  ?05/13/2021 8 6 - 29 U/L Final  ?  ?  ?  ?  Passed - Completed PHQ-2 or PHQ-9 in the last 360 days  ?  ?  Passed - Valid encounter within last 6 months  ?  Recent Outpatient Visits   ? ?      ? 3 months ago Functional diarrhea  ? [facility] [place], Coralie Keens, NP  ? 7 months ago Medicare annual wellness visit, subsequent  ? [facility] [place], [place] W, NP  ? 10 months ago Anxiety and depression  ? [facility] [place], [place] W, NP  ? ?  ?  ? ?  ?  ?  ? traMADol (ULTRAM) 50 MG tablet 30 tablet 0  ?  Sig: Take 1 tablet (50 mg total) by mouth every 8 (eight) hours as needed. for pain  ?  ?  Not Delegated - Analgesics:  Opioid Agonists Failed - 08/18/2021  1:30 PM  ?  ?  Failed - This refill cannot be delegated  ?  ?  Failed - Urine Drug Screen completed in last 360 days  ?  ?  Failed - Valid encounter within last 3 months  ?  Recent Outpatient Visits   ? ?      ? 3 months ago Functional diarrhea  ? [facility] [place], Coralie Keens, NP  ? 7 months ago Medicare annual wellness visit, subsequent  ? [facility] [place], [place], NP  ? 10 months ago Anxiety and depression  ? [facility] [place], Coralie Keens, NP  ? ?  ?  ? ?  ?  ?  ?Signed Prescriptions Disp Refills  ? busPIRone (BUSPAR) 5 MG tablet 270 tablet 0  ?  Sig: Take 1 tablet (5 mg total) by mouth 3 (three) times daily.  ?  ? Psychiatry: Anxiolytics/Hypnotics - Non-controlled Passed - 08/18/2021  1:30 PM  ?  ?  Passed - Valid encounter within last 12 months  ?  Recent Outpatient Visits   ? ?      ? 3 months ago Functional diarrhea  ? [facility] [place], Coralie Keens, NP  ? 7 months ago Medicare annual wellness visit, subsequent  ? [facility] [place], [place] W, NP  ? 10 months ago Anxiety and depression  ? [facility] [place], [place] W, NP  ? ?  ?  ? ?  ?  ?  ? losartan (COZAAR) 100 MG tablet 90 tablet 0  ?  Sig: Take 1 tablet (100 mg total) by  mouth daily.  ?  ? Cardiovascular:  Angiotensin Receptor Blockers Failed - 08/18/2021  1:30 PM  ?  ?  Failed - Last BP in normal range  ?  BP Readings from Last 1 Encounters:  ?07/18/21 (!) 162/88  ?  ?  ?  ?  Passed - Cr in normal range and within 180 days  ?  Creat  ?Date Value Ref Range Status  ?05/13/2021 0.71 0.50 - 1.05 mg/dL Final  ?  ?  ?  ?  Passed - K in normal range and within 180 days  ?  Potassium  ?Date Value Ref Range Status  ?05/13/2021 4.2 3.5 - 5.3 mmol/L Final  ?  ?  ?  ?  Passed - Patient is not pregnant  ?  ?  Passed - Valid encounter within last 6 months  ?  Recent Outpatient Visits   ? ?      ? 3 months ago Functional diarrhea  ? [facility] [place], Coralie Keens, NP  ? 7 months ago Medicare annual wellness visit, subsequent  ? [facility] [place], [place] W, NP  ? 10 months ago Anxiety and depression  ? [facility] [place], Coralie Keens, NP  ? ?  ?  ? ?  ?  ?  ? ?

## 2021-08-23 ENCOUNTER — Ambulatory Visit: Payer: Self-pay | Admitting: *Deleted

## 2021-08-23 ENCOUNTER — Telehealth: Payer: Self-pay

## 2021-08-23 NOTE — Telephone Encounter (Signed)
Reason for Disposition ? Caller has medicine question only, adult not sick, AND triager answers question ?   Given the names and dosages of medications she is on. ? ?Answer Assessment - Initial Assessment Questions ?1. NAME of MEDICATION: "What medicine are you calling about?" ?    [name] [name] called in regarding needing a list of medications she is taking for the neuropsychologist appt tomorrow. ?2. QUESTION: "What is your question?" (e.g., double dose of medicine, side effect) ?    I need a list of all her medications ?3. PRESCRIBING HCP: "Who prescribed it?" Reason: if prescribed by specialist, call should be referred to that group. ?    Webb Silversmith, NP ?4. SYMPTOMS: "Do you have any symptoms?" ?    N/A ?5. SEVERITY: If symptoms are present, ask "Are they mild, moderate or severe?" ?    N/A ?6. PREGNANCY:  "Is there any chance that you are pregnant?" "When was your last menstrual period?" ?    N/A ? ?Protocols used: Medication Question Call-A-AH ? ?

## 2021-08-23 NOTE — Telephone Encounter (Signed)
?  Chief Complaint: Husband, Tenna Lacko needing a list of her medications for the neuropsychologist appt tomorrow morning. ?Symptoms: N/A ?Frequency: N/A ?Pertinent Negatives: Patient denies N/A ?Disposition: '[]'$ ED /'[]'$ Urgent Care (no appt availability in office) / '[]'$ Appointment(In office/virtual)/ '[]'$  [place] [facility]/ '[]'$ Home Care/ '[]'$ Refused Recommended Disposition /'[]'$  Mobile Bus/ '[]'$  Follow-up with PCP ?Additional Notes: I read off her list of medications and their dosages for him  ?

## 2021-08-23 NOTE — Telephone Encounter (Signed)
Mr. Krizek was able to get a list of Amandajo's medications.  ? ?Thanks,  ? ?-Mickel Baas  ?

## 2021-08-23 NOTE — Telephone Encounter (Signed)
Copied from [place] [id number]. Topic: General - Other ?>> Aug 22, 2021 12:23 PM Pawlus, Brayton Layman A wrote: ?Reason for CRM: Pts husband called in wanting to go over the pts medications before the pts appt upcoming appt with neuro, please advise. ?

## 2021-08-24 ENCOUNTER — Ambulatory Visit (INDEPENDENT_AMBULATORY_CARE_PROVIDER_SITE_OTHER): Payer: Medicare HMO | Admitting: Psychology

## 2021-08-24 ENCOUNTER — Ambulatory Visit: Payer: Medicare HMO

## 2021-08-24 ENCOUNTER — Encounter: Payer: Self-pay | Admitting: Psychology

## 2021-08-24 DIAGNOSIS — F1011 Alcohol abuse, in remission: Secondary | ICD-10-CM

## 2021-08-24 DIAGNOSIS — F331 Major depressive disorder, recurrent, moderate: Secondary | ICD-10-CM

## 2021-08-24 DIAGNOSIS — F411 Generalized anxiety disorder: Secondary | ICD-10-CM

## 2021-08-24 DIAGNOSIS — G3184 Mild cognitive impairment, so stated: Secondary | ICD-10-CM | POA: Diagnosis not present

## 2021-08-24 DIAGNOSIS — F329 Major depressive disorder, single episode, unspecified: Secondary | ICD-10-CM | POA: Insufficient documentation

## 2021-08-24 DIAGNOSIS — Z8616 Personal history of COVID-19: Secondary | ICD-10-CM | POA: Insufficient documentation

## 2021-08-24 DIAGNOSIS — R4189 Other symptoms and signs involving cognitive functions and awareness: Secondary | ICD-10-CM

## 2021-08-24 HISTORY — DX: Mild cognitive impairment of uncertain or unknown etiology: G31.84

## 2021-08-24 NOTE — Progress Notes (Signed)
? ?NEUROPSYCHOLOGICAL EVALUATION ?[place] ?[place] Department of Neurology ? ?Date of Evaluation: August 24, 2021 ? ?Reason for Referral:  ? ?[name] [name] is a 71 y.o. right-handed Caucasian female referred by  Star Age, M.D. , to characterize her current cognitive functioning and assist with diagnostic clarity and treatment planning in the context of subjective cognitive decline and ongoing psychiatric comorbidities and psychosocial stressors.  ? ?Assessment and Plan:  ? ?Clinical Impression(s): ?Scores across stand-alone and embedded performance validity measures were variable but largely within expectation. Given this variability, a mild degree of caution may be prudent when interpreting the results of cognitive testing. ? ?If taken at face value, [name]'s pattern of performance is suggestive of impairment across all aspects of verbal memory. Additional impairment was exhibited semantic fluency, while variable but overall below expectation performances were noted across executive functioning and visual memory. Performances were appropriate relative to age-matched peers across processing speed, attention/concentration, safety/judgment, receptive language, phonemic fluency, confrontation naming, and visuospatial abilities. [name] largely denied difficulties completing instrumental activities of daily living (ADLs) independently. 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 ? ?The etiology of ongoing dysfunction is difficult to determine and likely mixed to some extent. Despite reporting significant alcohol reduction, she reported including a "splash" of Kahlua (estimated to be 0.5-1 shot in volume) within her morning coffee. Per her and her friend's estimations, she consumes 1-3 cups of coffee daily, which would suggest anywhere from 3.5 to 21 alcoholic  beverages per week. As the CDC defines "heavy drinking" in women as 8 or more alcoholic beverages per week, [name] is certainly at risk for exceeding this amount regularly. Persistent alcohol abuse will certainly worsen cognitive abilities and place her at an increased risk for the development of an alcohol-related dementia presentation in the future.  ? ?Outside of alcohol consumption, [name] reported acute symptoms of severe anxiety and moderate depression. She also reported severe sleep dysfunction on a related questionnaire. Ongoing severe psychiatric distress can also create and worsen cognitive dysfunction, particularly affecting domains of processing speed, attention/concentration, executive functioning, and learning and memory. Chronic sleep deprivation can have a similar impact, as well as prominent psychosocial stress.  ? ?At the present time, it is unclear how much of her current deficits are related to the factors described above. Despite this, I certainly cannot rule out the early stages of a neurodegenerative illness, namely Alzheimer's disease. [name] did not benefit from repeated exposure to new information, was fully amnestic across 2/4 memory tasks, and exhibited retention rates ranging from 0% to 50%. This could suggest rapid forgetting and an emerging memory storage deficit, which are hallmark characteristics of this illness. Deficits in semantic fluency would also represent fairly typical disease progression. Overall, continued monitoring will be important moving forward.  ? ?She does not exhibit behavioral characteristics concerning for Lewy body dementia, frontotemporal dementia, or a more rare parkinsonian syndrome. Imaging did not reveal age-advanced cerebrovascular changes worrisome for a vascular dementia presentation. ? ?Recommendations: ?A repeat neuropsychological evaluation in 18 months (or sooner if functional decline is noted) is recommended to assess the trajectory of  future cognitive decline should it occur. This would ideally be accomplished once mood-related concerns are better managed and she has maintained sobriety from alcohol. ? ?Around the time of her previous meeting with Dr. Rexene Alberts, [name] reported complete alcohol cessation. I would strongly encourage  her to resume this behavior and eliminate Kahlua from her morning coffee. As she reported using this as a way of sweetening this beverage, I would encourage her to trial other non-alcoholic sweetening alternatives.  ? ?If desired, [name] could discuss memory-based medication options with Dr. Rexene Alberts given ongoing deficits. It is important to highlight that this medication has been shown to slow functional decline in some individuals. There is no current treatment which can stop or reverse cognitive decline when caused by a neurodegenerative illness.  ? ?A combination of medication and psychotherapy has been shown to be most effective at treating symptoms of anxiety and depression. As such, [name] is encouraged to speak with her prescribing physician regarding medication adjustments to optimally manage these symptoms. She should also discuss ways to improve her sleep with this physician.  ? ?I would strongly encourage her to engage in individual psychotherapy to address severe psychiatric distress. Marital counseling in addition to individual counseling would also be beneficial given marital discord representing a significant life stressor; however, this would require buy-in from her husband. She would benefit from an active and collaborative therapeutic environment, rather than one purely supportive in nature. Recommended treatment modalities include Cognitive Behavioral Therapy (CBT) or Acceptance and Commitment Therapy (ACT). ? ?[name] is encouraged to attend to lifestyle factors for brain health (e.g., regular physical exercise, good nutrition habits, regular participation in cognitively-stimulating  activities, and general stress management techniques), which are likely to have benefits for both emotional adjustment and cognition. In fact, in addition to promoting good general health, regular exercise incorporating aerobic activities (e.g., brisk walking, jogging, cycling, etc.) has been demonstrated to be a very effective treatment for depression and stress, with similar efficacy rates to both antidepressant medication and psychotherapy. Optimal control of vascular risk factors (including safe cardiovascular exercise and adherence to dietary recommendations) is encouraged. Continued participation in activities which provide mental stimulation and social interaction is also recommended.  ? ?When learning new information, she would benefit from information being broken up into small, manageable pieces. She may also find it helpful to articulate the material in her own words and in a context to promote encoding at the onset of a new task. This material may need to be repeated multiple times to promote encoding. ? ?Memory can be improved using internal strategies such as rehearsal, repetition, chunking, mnemonics, association, and imagery. External strategies such as written notes in a consistently used memory journal, visual and nonverbal auditory cues such as a calendar on the refrigerator or appointments with alarm, such as on a cell phone, can also help maximize recall.   ? ?To address problems with fluctuating attention, she may wish to consider: ?  -Avoiding external distractions when needing to concentrate ?  -Limiting exposure to fast paced environments with multiple sensory demands ?  -Writing down complicated information and using checklists ?  -Attempting and completing one task at a time (i.e., no multi-tasking) ?  -Verbalizing aloud each step of a task to maintain focus ?  -Reducing the amount of information considered at one time ? ?Review of Records:  ? ?Ms. Boy was admitted to the ED on  12/29/2019 with generalized weakness, poor oral intake, fever, and diarrhea. She was found to be septic secondary to a UTI. She was treated with IV fluids and empiric IV antibiotics. Urine culture showed E. Coli. She a

## 2021-08-24 NOTE — Progress Notes (Signed)
? ?  Psychometrician Note ?  ?Cognitive testing was administered to [name] [name] by Cruzita Lederer, B.S. (psychometrist) under the supervision of Dr. Christia Reading, Ph.D., licensed psychologist on 08/24/2021. [name] [name] did not appear overtly distressed by the testing session per behavioral observation or responses across self-report questionnaires. Rest breaks were offered.  ?  ?The battery of tests administered was selected by Dr. Christia Reading, Ph.D. with consideration to [name] [name]'s current level of functioning, the nature of her symptoms, emotional and behavioral responses during interview, level of literacy, observed level of motivation/effort, and the nature of the referral question. This battery was communicated to the psychometrist. Communication between Dr. Christia Reading, Ph.D. and the psychometrist was ongoing throughout the evaluation and Dr. Christia Reading, Ph.D. was immediately accessible at all times. Dr. Christia Reading, Ph.D. provided supervision to the psychometrist on the date of this service to the extent necessary to assure the quality of all services provided.  ?  ?[name] [name] will return within approximately 1-2 weeks for an interactive feedback session with Dr. Melvyn Novas at which time her test performances, clinical impressions, and treatment recommendations will be reviewed in detail. [name] [name] understands she can contact our office should she require our assistance before this time. ? ?A total of 150 minutes of billable time were spent face-to-face with [name] [name] by the psychometrist. This includes both test administration and scoring time. Billing for these services is reflected in the clinical report generated by Dr. Christia Reading, Ph.D. ? ?This note reflects time spent with the psychometrician and does not include test scores or any clinical interpretations made by Dr. Melvyn Novas. The full report will follow in a separate note.  ?

## 2021-08-31 ENCOUNTER — Ambulatory Visit (INDEPENDENT_AMBULATORY_CARE_PROVIDER_SITE_OTHER): Payer: Medicare HMO | Admitting: Psychology

## 2021-08-31 DIAGNOSIS — F411 Generalized anxiety disorder: Secondary | ICD-10-CM

## 2021-08-31 DIAGNOSIS — F1011 Alcohol abuse, in remission: Secondary | ICD-10-CM | POA: Diagnosis not present

## 2021-08-31 DIAGNOSIS — F331 Major depressive disorder, recurrent, moderate: Secondary | ICD-10-CM

## 2021-08-31 DIAGNOSIS — G3184 Mild cognitive impairment, so stated: Secondary | ICD-10-CM

## 2021-08-31 NOTE — Progress Notes (Signed)
? ?  Neuropsychology Feedback Session ?[place] ?[place] Department of Neurology ? ?Reason for Referral:  ? ?[name] [name] is a 71 y.o. right-handed Caucasian female referred by  Star Age, M.D. , to characterize her current cognitive functioning and assist with diagnostic clarity and treatment planning in the context of subjective cognitive decline and ongoing psychiatric comorbidities and psychosocial stressors.  ? ?Feedback:  ? ?[name] completed a comprehensive neuropsychological evaluation on 08/24/2021. Please refer to that encounter for the full report and recommendations. Briefly, results suggested impairment across all aspects of verbal memory. Additional impairment was exhibited semantic fluency, while variable but overall below expectation performances were noted across executive functioning and visual memory. The etiology of ongoing dysfunction is difficult to determine and likely mixed to some extent. Despite reporting significant alcohol reduction, she reported including a "splash" of Kahlua (estimated to be 0.5-1 shot in volume) within her morning coffee. Per her and her friend's estimations, she consumes 1-3 cups of coffee daily, which would suggest anywhere from 3.5 to 21 alcoholic beverages per week. Outside of alcohol consumption, [name] reported acute symptoms of severe anxiety and moderate depression. She also reported severe sleep dysfunction on a related questionnaire. Ongoing severe psychiatric distress can also create and worsen cognitive dysfunction, particularly affecting domains of processing speed, attention/concentration, executive functioning, and learning and memory. Chronic sleep deprivation can have a similar impact, as well as prominent psychosocial stress. At the present time, it is unclear how much of her current deficits are related to the factors described above. Despite this, I certainly cannot rule out the early stages of a neurodegenerative  illness, namely Alzheimer's disease. [name] did not benefit from repeated exposure to new information, was fully amnestic across 2/4 memory tasks, and exhibited retention rates ranging from 0% to 50%. This could suggest rapid forgetting and an emerging memory storage deficit, which are hallmark characteristics of this illness. Deficits in semantic fluency would also represent fairly typical disease progression. Overall, continued monitoring will be important moving forward.  ? ?[name] was accompanied by her husband during the current feedback session. Content of the current session focused on the results of her neuropsychological evaluation. [name] was given the opportunity to ask questions and her questions were answered. She was encouraged to reach out should additional questions arise. A copy of her report was provided at the conclusion of the visit.  ? ?  ? ?40 minutes were spent conducting the current feedback session with [name], billed as one unit [id number].  ?

## 2021-09-06 ENCOUNTER — Telehealth: Payer: Self-pay | Admitting: Neurology

## 2021-09-06 NOTE — Telephone Encounter (Signed)
Pt called stating that she went and saw the phycologist that she was referred to and was informed that any medications that she is needing is given by her Neurologist. Please call pt back to discuss.  ?

## 2021-09-06 NOTE — Telephone Encounter (Signed)
I called patient to discuss. No answer, left a message asking her to call us back.  ? ?It appears that patient is overdue for an appointment with Dr. Rexene Alberts. It will be best to discuss adding medications during an office visit. Please offer an appointment with Dr. Rexene Alberts to the patient if she returns the call. ? ?Dr. Melvyn Novas recommended: "If desired, Ms. Goehring could discuss memory-based medication options with Dr. Rexene Alberts given ongoing deficits. It is important to highlight that this medication has been shown to slow functional decline in some individuals. There is no current treatment which can stop or reverse cognitive decline when caused by a neurodegenerative illness. she meets criteria for a Mild Neurocognitive Disorder ("mild cognitive impairment") at the present time." ?

## 2021-09-21 ENCOUNTER — Encounter: Payer: Self-pay | Admitting: Neurology

## 2021-09-21 ENCOUNTER — Ambulatory Visit: Payer: Medicare HMO | Admitting: Neurology

## 2021-09-21 VITALS — BP 127/75 | HR 55 | Ht 59.0 in | Wt 100.4 lb

## 2021-09-21 DIAGNOSIS — F17200 Nicotine dependence, unspecified, uncomplicated: Secondary | ICD-10-CM

## 2021-09-21 DIAGNOSIS — G3184 Mild cognitive impairment, so stated: Secondary | ICD-10-CM

## 2021-09-21 DIAGNOSIS — F1011 Alcohol abuse, in remission: Secondary | ICD-10-CM

## 2021-09-21 DIAGNOSIS — F439 Reaction to severe stress, unspecified: Secondary | ICD-10-CM

## 2021-09-21 DIAGNOSIS — R634 Abnormal weight loss: Secondary | ICD-10-CM

## 2021-09-21 MED ORDER — MEMANTINE HCL 5 MG PO TABS: 5.0000 mg | ORAL_TABLET | Freq: Two times a day (BID) | ORAL | 5 refills | Status: DC

## 2021-09-21 NOTE — Progress Notes (Signed)
Subjective:  ?  ?Patient ID: MAYU RONK is a 71 y.o. female. ? ?HPI ? ? ? ?Interim history:  ? ?Ms. Righter is a 71 year old right-handed woman with an underlying medical history of hypertension, allergies, anxiety, depression, and excessive alcohol consumption, who presents for follow-up consultation of her memory loss.  The patient is accompanied by a friend, Letta Median (her best friend of over 39 years) today. I first met her at the request of her primary care nurse practitioner on 10/25/2020, at which time she reported a several year history of forgetfulness and getting lost while driving.  Her MMSE was 26 out of 30 at the time.  I suggested we proceed with additional work-up in the form of neuropsychological evaluation and brain MRI.  She had recent labs done through her primary care including vitamin B12 which was elevated at the time, TSH was 2.71 in March 2022.   ? ?She had a brain MRI with and without contrast on 11/07/2020 and I reviewed the results: IMPRESSION: This MRI of the brain with and without contrast shows the following: ?1.  Mild generalized cortical atrophy, mildly progressed compared to the 03/22/2019 MRI. ?2.  Some scattered punctate T2/FLAIR hyperintense foci in the hemispheres consistent with minimal chronic microvascular ischemic change, stable compared to the 2020 MRI. ?3.  No acute findings. ?4.  Normal enhancement pattern. ? ?A referral was made to the [facility] for neuropsychological evaluation, October 2022 but did not make the appointment.  She saw Dr. Hazle Coca in April 2023.   ?I reviewed his impression and recommendations as outlined in his visit note from 08/24/2021: <<Clinical Impression(s): ?Scores across stand-alone and embedded performance validity measures were variable but largely within expectation. Given this variability, a mild degree of caution may be prudent when interpreting the results of cognitive testing. ?  ?If taken at face value, Ms. Rybarczyk's pattern of  performance is suggestive of impairment across all aspects of verbal memory. Additional impairment was exhibited semantic fluency, while variable but overall below expectation performances were noted across executive functioning and visual memory. Performances were appropriate relative to age-matched peers across processing speed, attention/concentration, safety/judgment, receptive language, phonemic fluency, confrontation naming, and visuospatial abilities. Ms. Dress largely denied difficulties completing instrumental activities of daily living (ADLs) independently. 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 ?  ?The etiology of ongoing dysfunction is difficult to determine and likely mixed to some extent. Despite reporting significant alcohol reduction, she reported including a "splash" of Kahlua (estimated to be 0.5-1 shot in volume) within her morning coffee. Per her and her friend's estimations, she consumes 1-3 cups of coffee daily, which would suggest anywhere from 3.5 to 21 alcoholic beverages per week. As the CDC defines "heavy drinking" in women as 8 or more alcoholic beverages per week, Ms. Mccrae is certainly at risk for exceeding this amount regularly. Persistent alcohol abuse will certainly worsen cognitive abilities and place her at an increased risk for the development of an alcohol-related dementia presentation in the future.  ?  ?Outside of alcohol consumption, Ms. Tenpas reported acute symptoms of severe anxiety and moderate depression. She also reported severe sleep dysfunction on a related questionnaire. Ongoing severe psychiatric distress can also create and worsen cognitive dysfunction, particularly affecting domains of processing speed, attention/concentration, executive functioning, and learning and memory. Chronic sleep deprivation can have a similar impact, as  well as prominent psychosocial stress.  ?  ?  At the present time, it is unclear how much of her current deficits are related to the factors described above. Despite this, I certainly cannot rule out the early stages of a neurodegenerative illness, namely Alzheimer's disease. Ms. Brummell did not benefit from repeated exposure to new information, was fully amnestic across 2/4 memory tasks, and exhibited retention rates ranging from 0% to 50%. This could suggest rapid forgetting and an emerging memory storage deficit, which are hallmark characteristics of this illness. Deficits in semantic fluency would also represent fairly typical disease progression. Overall, continued monitoring will be important moving forward.  ?  ?She does not exhibit behavioral characteristics concerning for Lewy body dementia, frontotemporal dementia, or a more rare parkinsonian syndrome. Imaging did not reveal age-advanced cerebrovascular changes worrisome for a vascular dementia presentation. ?  ?Recommendations: ?A repeat neuropsychological evaluation in 18 months (or sooner if functional decline is noted) is recommended to assess the trajectory of future cognitive decline should it occur. This would ideally be accomplished once mood-related concerns are better managed and she has maintained sobriety from alcohol. ?  ?Around the time of her previous meeting with Dr. Rexene Alberts, Ms. Jimmerson reported complete alcohol cessation. I would strongly encourage her to resume this behavior and eliminate Kahlua from her morning coffee. As she reported using this as a way of sweetening this beverage, I would encourage her to trial other non-alcoholic sweetening alternatives.  ?  ?If desired, Ms. Kronk could discuss memory-based medication options with Dr. Rexene Alberts given ongoing deficits. It is important to highlight that this medication has been shown to slow functional decline in some individuals. There is no current treatment which can stop or reverse  cognitive decline when caused by a neurodegenerative illness.  ?  ?A combination of medication and psychotherapy has been shown to be most effective at treating symptoms of anxiety and depression. As such, Ms. Bradway is encouraged to speak with her prescribing physician regarding medication adjustments to optimally manage these symptoms. She should also discuss ways to improve her sleep with this physician.  ?  ?I would strongly encourage her to engage in individual psychotherapy to address severe psychiatric distress. Marital counseling in addition to individual counseling would also be beneficial given marital discord representing a significant life stressor; however, this would require buy-in from her husband. She would benefit from an active and collaborative therapeutic environment, rather than one purely supportive in nature. Recommended treatment modalities include Cognitive Behavioral Therapy (CBT) or Acceptance and Commitment Therapy (ACT). ?  ?Ms. Arias is encouraged to attend to lifestyle factors for brain health (e.g., regular physical exercise, good nutrition habits, regular participation in cognitively-stimulating activities, and general stress management techniques), which are likely to have benefits for both emotional adjustment and cognition. In fact, in addition to promoting good general health, regular exercise incorporating aerobic activities (e.g., brisk walking, jogging, cycling, etc.) has been demonstrated to be a very effective treatment for depression and stress, with similar efficacy rates to both antidepressant medication and psychotherapy. Optimal control of vascular risk factors (including safe cardiovascular exercise and adherence to dietary recommendations) is encouraged. Continued participation in activities which provide mental stimulation and social interaction is also recommended.  ?  ?When learning new information, she would benefit from information being broken up into small,  manageable pieces. She may also find it helpful to articulate the material in her own words and in a context to promote encoding at the onset of a new task. This material may need to be repeated multiple times  to promot

## 2021-09-21 NOTE — Patient Instructions (Addendum)
It was nice to see you again today.   ?We will start you for your memory on Namenda (generic name: Memantine), 5 mg: Take 1 pill once daily for 2 weeks, then increase to 1 pill twice daily thereafter.  At your next appointment, we may be able to increase it to 10 mg twice daily.  ?Please note that side effects may include, but are not limited to: nausea, confusion, hallucination, personality changes. If you are having mild side effects, try to stick with the treatment as these initial side effects may go away after the first 10-14 days.    ?Please make a follow-up appointment with your primary care provider to discuss stress management, the possibility of counseling, including marriage counseling if feasible.  Please talk to her about smoking cessation and nutrition and discuss your weight loss and low heart rate. ? ?

## 2021-09-23 ENCOUNTER — Ambulatory Visit (INDEPENDENT_AMBULATORY_CARE_PROVIDER_SITE_OTHER): Payer: Medicare HMO | Admitting: Internal Medicine

## 2021-09-23 ENCOUNTER — Encounter: Payer: Self-pay | Admitting: Internal Medicine

## 2021-09-23 VITALS — BP 134/74 | HR 65 | Temp 97.8°F | Wt 102.0 lb

## 2021-09-23 DIAGNOSIS — F32A Depression, unspecified: Secondary | ICD-10-CM | POA: Diagnosis not present

## 2021-09-23 DIAGNOSIS — G3184 Mild cognitive impairment, so stated: Secondary | ICD-10-CM | POA: Diagnosis not present

## 2021-09-23 DIAGNOSIS — K529 Noninfective gastroenteritis and colitis, unspecified: Secondary | ICD-10-CM

## 2021-09-23 DIAGNOSIS — I10 Essential (primary) hypertension: Secondary | ICD-10-CM | POA: Diagnosis not present

## 2021-09-23 DIAGNOSIS — M47818 Spondylosis without myelopathy or radiculopathy, sacral and sacrococcygeal region: Secondary | ICD-10-CM | POA: Diagnosis not present

## 2021-09-23 DIAGNOSIS — E782 Mixed hyperlipidemia: Secondary | ICD-10-CM | POA: Diagnosis not present

## 2021-09-23 DIAGNOSIS — F419 Anxiety disorder, unspecified: Secondary | ICD-10-CM | POA: Diagnosis not present

## 2021-09-23 MED ORDER — ASPIRIN 81 MG PO TBEC: 81.0000 mg | DELAYED_RELEASE_TABLET | Freq: Every day | ORAL | 12 refills | Status: AC

## 2021-09-23 MED ORDER — MIRTAZAPINE 7.5 MG PO TABS: 7.5000 mg | ORAL_TABLET | Freq: Every day | ORAL | 2 refills | Status: DC

## 2021-09-23 NOTE — Assessment & Plan Note (Signed)
She will start Namenda ?Will monitor ?

## 2021-09-23 NOTE — Assessment & Plan Note (Signed)
Will d/c Sertraline ?Continue Buspar ?Start Mirtazapine 7.5 mg QHS ?

## 2021-09-23 NOTE — Assessment & Plan Note (Signed)
CMET and lipid profile today Encouraged her to consume a low fat diet 

## 2021-09-23 NOTE — Assessment & Plan Note (Signed)
She could not afford Budesonide, will D/C ?She reports diarrhea is manageable at this time, will monitor ?

## 2021-09-23 NOTE — Assessment & Plan Note (Signed)
Controlled on Losartan and HCTZ ?Reinforced DASH diet ?CMET today ?

## 2021-09-23 NOTE — Progress Notes (Signed)
? ?Subjective:  ? ? Patient ID: [name] [name], female    DOB: 08/03/1950, 71 y.o.   MRN: [id number] ? ?HPI ? ?Pt presents to the clinic today for follow up of chronic conditions. ? ?HTN: Her BP today is 134/74.  She is taking Losartan/HCTZas prescribed.  ECG from 10/2017 reviewed. ? ?MCI: She reports difficulty with short-term memory.  She is taking Namenda as prescribed.  She has been following with neurology. ? 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  She takes Tramadol as needed with good relief of symptoms.  She does not follow with orthopedics. ? ?Anxiety and Depression: Chronic, managed on Sertraline and Buspirone.  She is not currently seeing a therapist.  She denies SI/HI. ? ?Chronic Diarrhea/Lymphocytic Diarrhea: She is not taking Budesonide and Probiotic. She follows with GI. ? ?HLD:  Her last LDL was 141, triglycerides 195, 07/2020. She is not taking any cholesterol lowering medication at this time. She does not consume a low fat diet. ? ?Review of Systems ? ?   ?Past Medical History:  ?Diagnosis Date  ? Alcohol abuse in partial remission   ? Allergy   ? Essential hypertension 04/27/2009  ? Generalized anxiety disorder 10/22/2014  ? History of COVID-19   ? HLD (hyperlipidemia) 04/27/2009  ? Major depressive disorder   ? Mild cognitive impairment with memory loss 08/24/2021  ? 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 ? ?Current Outpatient Medications  ?Medication Sig Dispense Refill  ? budesonide (ENTOCORT EC) 3 MG 24 hr capsule Take 3 capsules (9 mg total) by mouth once daily 90 capsule 2  ? busPIRone (BUSPAR) 5 MG tablet Take 1 tablet (5 mg total) by mouth 3 (three) times daily. 270 tablet 0  ? Cholecalciferol (VITAMIN D3) 50 MCG (2000 UT) TABS Take 1 tablet by mouth daily.    ? hydrochlorothiazide (HYDRODIURIL) 25 MG tablet Take 1 tablet (25 mg total) by mouth daily. 90 tablet 0  ? losartan (COZAAR) 100 MG tablet Take 1 tablet (100 mg total) by mouth  daily. 90 tablet 0  ? memantine (NAMENDA) 5 MG tablet Take 1 tablet (5 mg total) by mouth 2 (two) times daily. Follow titration instructions provided. 60 tablet 5  ? Probiotic Product (TRUNATURE DIGESTIVE PROBIOTIC PO) Take 1 tablet by mouth daily at 2 PM.    ? rosuvastatin (CRESTOR) 10 MG tablet Take 1 tablet (10 mg total) by mouth daily. 90 tablet 2  ? sertraline (ZOLOFT) 100 MG tablet Take 1 tablet (100 mg total) by mouth daily. 90 tablet 0  ? traMADol (ULTRAM) 50 MG tablet TAKE 1 TABLET BY MOUTH EVERY 8 HOURS AS NEEDED FOR PAIN (Patient taking differently: as needed.) 30 tablet 0  ? ?No current facility-administered medications for this visit.  ? ? ?Allergies  ?Allergen Reactions  ? Erythromycin Other (See Comments)  ?  REACTION: GI upset  ? Penicillins Nausea And Vomiting  ? ? ?Family History  ?Problem Relation Age of Onset  ? Diabetes Mother   ? Stroke Mother   ? Other Mother   ?     colon sugery  ? Polymyalgia rheumatica Mother   ? Memory loss Mother   ?     late 5s  ? Alcoholism Mother   ? Memory loss Father   ? Heart disease Father   ? Diabetes Father   ? Dementia Father   ?     unspecified type; late 70s/early 66s  ? Colon cancer Neg Hx   ?  Cancer Neg Hx   ? Esophageal cancer Neg Hx   ? Pancreatic cancer Neg Hx   ? Stomach cancer Neg Hx   ? ? ?Social History  ? ?Socioeconomic History  ? Marital status: Married  ?  Spouse name: Not on file  ? Number of children: 2  ? Years of education: 30  ? Highest education level: Bachelor's degree (e.g., BA, AB, BS)  ?Occupational History  ? Occupation: Retired  ?  Comment: school teacher  ?Tobacco Use  ? Smoking status: Every Day  ?  Packs/day: 1.00  ?  Types: Cigarettes  ? Smokeless tobacco: Never  ?Vaping Use  ? Vaping Use: Former  ?Substance and Sexual Activity  ? Alcohol use: Not Currently  ?  Comment: occ  ? Drug use: No  ? Sexual activity: Not Currently  ?  Birth control/protection: Post-menopausal  ?Other Topics Concern  ? Not on file  ?Social History Narrative   ? Not on file  ? ?Social Determinants of Health  ? ?Financial Resource Strain: Not on file  ?Food Insecurity: Not on file  ?Transportation Needs: Not on file  ?Physical Activity: Not on file  ?Stress: Not on file  ?Social Connections: Not on file  ?Intimate Partner Violence: Not on file  ? ? ? ?Constitutional: Denies fever, malaise, fatigue, headache or abrupt weight changes.  ?HEENT: Denies eye pain, eye redness, ear pain, ringing in the ears, wax buildup, runny nose, nasal congestion, bloody nose, or sore throat. ?Respiratory: Denies difficulty breathing, shortness of breath, cough or sputum production.   ?Cardiovascular: Denies chest pain, chest tightness, palpitations or swelling in the hands or feet.  ?Gastrointestinal: Pt reports chronic diarrhea. Denies abdominal pain, bloating, constipation, or blood in the stool.  ?GU: Denies urgency, frequency, pain with urination, burning sensation, blood in urine, odor or discharge. ?Musculoskeletal: Pt reports chronic back pain. Denies decrease in range of motion, difficulty with gait, or joint swelling.  ?Skin: Denies redness, rashes, lesions or ulcercations.  ?Neurological: Pt reports difficulty with memory. Denies dizziness, difficulty with speech or problems with balance and coordination.  ?Psych: Pt has a history of anxiety and depression. Denies SI/HI. ? ?No other specific complaints in a complete review of systems (except as listed in HPI above). ? ?Objective:  ? Physical Exam ? ?BP 134/74 (BP Location: Left Arm, Patient Position: Sitting, Cuff Size: Normal)   Pulse 65   Temp 97.8 ?F (36.6 ?C) (Temporal)   Wt 102 lb (46.3 kg)   SpO2 99%   BMI 20.60 kg/m?  ? ?Wt Readings from Last 3 Encounters:  ?09/21/21 100 lb 6.4 oz (45.5 kg)  ?07/18/21 103 lb (46.7 kg)  ?06/08/21 108 lb (49 kg)  ? ? ?General: Appears her stated age, well developed, well nourished in NAD. ?HEENT: Head: normal shape and size; Eyes: sclera white, no icterus, conjunctiva pink, PERRLA and  EOMs intact;  ?Cardiovascular: Normal rate and rhythm. S1,S2 noted.  No murmur, rubs or gallops noted. No JVD or BLE edema. No carotid bruits noted. ?Pulmonary/Chest: Normal effort and positive vesicular breath sounds. No respiratory distress. No wheezes, rales or ronchi noted.  ?Abdomen: Soft and nontender. Normal bowel sounds.  ?Musculoskeletal: No difficulty with gait.  ?Neurological: Alert and oriented.  ?Psychiatric: Mood and affect normal. Behavior is normal. Judgment and thought content normal.  ? ? ? ?BMET ?   ?Component Value Date/Time  ? NA 138 05/13/2021 1437  ? K 4.2 05/13/2021 1437  ? CL 105 05/13/2021 1437  ? CO2  26 05/13/2021 1437  ? GLUCOSE 84 05/13/2021 1437  ? BUN 13 05/13/2021 1437  ? CREATININE 0.71 05/13/2021 1437  ? CALCIUM 9.6 05/13/2021 1437  ? GFRNONAA >60 01/01/2020 0405  ? GFRAA >60 01/01/2020 0405  ? ? ?Lipid Panel  ?   ?Component Value Date/Time  ? CHOL 222 (H) 01/04/2021 1356  ? TRIG 191 (H) 01/04/2021 1356  ? HDL 44 (L) 01/04/2021 1356  ? CHOLHDL 5.0 (H) 01/04/2021 1356  ? VLDL 33.8 05/13/2019 1228  ? LDLCALC 145 (H) 01/04/2021 1356  ? ? ?CBC ?   ?Component Value Date/Time  ? WBC 8.1 05/13/2021 1437  ? RBC 4.08 05/13/2021 1437  ? HGB 13.3 05/13/2021 1437  ? HCT 39.7 05/13/2021 1437  ? PLT 262 05/13/2021 1437  ? MCV 97.3 05/13/2021 1437  ? MCH 32.6 05/13/2021 1437  ? MCHC 33.5 05/13/2021 1437  ? RDW 13.1 05/13/2021 1437  ? LYMPHSABS 1.5 12/31/2019 0904  ? MONOABS 1.0 12/31/2019 0904  ? EOSABS 0.1 12/31/2019 0904  ? BASOSABS 0.0 12/31/2019 0904  ? ? ?Hgb A1C ?Lab Results  ?Component Value Date  ? HGBA1C 5.7 04/13/2015  ? ? ? ? ? ? ?   ?Assessment & Plan:  ? ? ? ?Webb Silversmith, NP ? ?

## 2021-09-23 NOTE — Assessment & Plan Note (Signed)
Continue Tramadol as needed ?

## 2021-09-23 NOTE — Patient Instructions (Signed)
Heart-Healthy Eating Plan Heart-healthy meal planning includes: Eating less unhealthy fats. Eating more healthy fats. Making other changes in your diet. Talk with your doctor or a diet specialist (dietitian) to create an eating plan that is right for you. What is my plan? Your doctor may recommend an eating plan that includes: Total fat: ______% or less of total calories a day. Saturated fat: ______% or less of total calories a day. Cholesterol: less than _________mg a day. What are tips for following this plan? Cooking Avoid frying your food. Try to bake, boil, grill, or broil it instead. You can also reduce fat by: Removing the skin from poultry. Removing all visible fats from meats. Steaming vegetables in water or broth. Meal planning  At meals, divide your plate into four equal parts: Fill one-half of your plate with vegetables and green salads. Fill one-fourth of your plate with whole grains. Fill one-fourth of your plate with lean protein foods. Eat 4-5 servings of vegetables per day. A serving of vegetables is: 1 cup of raw or cooked vegetables. 2 cups of raw leafy greens. Eat 4-5 servings of fruit per day. A serving of fruit is: 1 medium whole fruit.  cup of dried fruit.  cup of fresh, frozen, or canned fruit.  cup of 100% fruit juice. Eat more foods that have soluble fiber. These are apples, broccoli, carrots, beans, peas, and barley. Try to get 20-30 g of fiber per day. Eat 4-5 servings of nuts, legumes, and seeds per week: 1 serving of dried beans or legumes equals  cup after being cooked. 1 serving of nuts is  cup. 1 serving of seeds equals 1 tablespoon. General information Eat more home-cooked food. Eat less restaurant, buffet, and fast food. Limit or avoid alcohol. Limit foods that are high in starch and sugar. Avoid fried foods. Lose weight if you are overweight. Keep track of how much salt (sodium) you eat. This is important if you have high blood  pressure. Ask your doctor to tell you more about this. Try to add vegetarian meals each week. Fats Choose healthy fats. These include olive oil and canola oil, flaxseeds, walnuts, almonds, and seeds. Eat more omega-3 fats. These include salmon, mackerel, sardines, tuna, flaxseed oil, and ground flaxseeds. Try to eat fish at least 2 times each week. Check food labels. Avoid foods with trans fats or high amounts of saturated fat. Limit saturated fats. These are often found in animal products, such as meats, butter, and cream. These are also found in plant foods, such as palm oil, palm kernel oil, and coconut oil. Avoid foods with partially hydrogenated oils in them. These have trans fats. Examples are stick margarine, some tub margarines, cookies, crackers, and other baked goods. What foods can I eat? Fruits All fresh, canned (in natural juice), or frozen fruits. Vegetables Fresh or frozen vegetables (raw, steamed, roasted, or grilled). Green salads. Grains Most grains. Choose whole wheat and whole grains most of the time. Rice and pasta, including brown rice and pastas made with whole wheat. Meats and other proteins Lean, well-trimmed beef, veal, pork, and lamb. Chicken and turkey without skin. All fish and shellfish. Wild duck, rabbit, pheasant, and venison. Egg whites or low-cholesterol egg substitutes. Dried beans, peas, lentils, and tofu. Seeds and most nuts. Dairy Low-fat or nonfat cheeses, including ricotta and mozzarella. Skim or 1% milk that is liquid, powdered, or evaporated. Buttermilk that is made with low-fat milk. Nonfat or low-fat yogurt. Fats and oils Non-hydrogenated (trans-free) margarines. Vegetable oils, including   soybean, sesame, sunflower, olive, peanut, safflower, corn, canola, and cottonseed. Salad dressings or mayonnaise made with a vegetable oil. Beverages Mineral water. Coffee and tea. Diet carbonated beverages. Sweets and desserts Sherbet, gelatin, and fruit ice.  Small amounts of dark chocolate. Limit all sweets and desserts. Seasonings and condiments All seasonings and condiments. The items listed above may not be a complete list of foods and drinks you can eat. Contact a dietitian for more options. What foods should I avoid? Fruits Canned fruit in heavy syrup. Fruit in cream or butter sauce. Fried fruit. Limit coconut. Vegetables Vegetables cooked in cheese, cream, or butter sauce. Fried vegetables. Grains Breads that are made with saturated or trans fats, oils, or whole milk. Croissants. Sweet rolls. Donuts. High-fat crackers, such as cheese crackers. Meats and other proteins Fatty meats, such as hot dogs, ribs, sausage, bacon, rib-eye roast or steak. High-fat deli meats, such as salami and bologna. Caviar. Domestic duck and goose. Organ meats, such as liver. Dairy Cream, sour cream, cream cheese, and creamed cottage cheese. Whole-milk cheeses. Whole or 2% milk that is liquid, evaporated, or condensed. Whole buttermilk. Cream sauce or high-fat cheese sauce. Yogurt that is made from whole milk. Fats and oils Meat fat, or shortening. Cocoa butter, hydrogenated oils, palm oil, coconut oil, palm kernel oil. Solid fats and shortenings, including bacon fat, salt pork, lard, and butter. Nondairy cream substitutes. Salad dressings with cheese or sour cream. Beverages Regular sodas and juice drinks with added sugar. Sweets and desserts Frosting. Pudding. Cookies. Cakes. Pies. Milk chocolate or white chocolate. Buttered syrups. Full-fat ice cream or ice cream drinks. The items listed above may not be a complete list of foods and drinks to avoid. Contact a dietitian for more information. Summary Heart-healthy meal planning includes eating less unhealthy fats, eating more healthy fats, and making other changes in your diet. Eat a balanced diet. This includes fruits and vegetables, low-fat or nonfat dairy, lean protein, nuts and legumes, whole grains, and  heart-healthy oils and fats. This information is not intended to replace advice given to you by your health care provider. Make sure you discuss any questions you have with your health care provider. Document Revised: 09/09/2020 Document Reviewed: 09/09/2020 Elsevier Patient Education  2022 [employer]  

## 2021-09-24 LAB — COMPLETE METABOLIC PANEL WITH GFR
AG Ratio: 1.6 (calc) (ref 1.0–2.5)
ALT: 11 U/L (ref 6–29)
AST: 17 U/L (ref 10–35)
Albumin: 4.2 g/dL (ref 3.6–5.1)
Alkaline phosphatase (APISO): 55 U/L (ref 37–153)
BUN: 10 mg/dL (ref 7–25)
CO2: 26 mmol/L (ref 20–32)
Calcium: 9.4 mg/dL (ref 8.6–10.4)
Chloride: 99 mmol/L (ref 98–110)
Creat: 0.77 mg/dL (ref 0.50–1.05)
Globulin: 2.7 g/dL (calc) (ref 1.9–3.7)
Glucose, Bld: 83 mg/dL (ref 65–139)
Potassium: 3.6 mmol/L (ref 3.5–5.3)
Sodium: 135 mmol/L (ref 135–146)
Total Bilirubin: 0.5 mg/dL (ref 0.2–1.2)
Total Protein: 6.9 g/dL (ref 6.1–8.1)
eGFR: 83 mL/min/{1.73_m2} (ref >=60)

## 2021-09-24 LAB — LIPID PANEL
Cholesterol: 220 mg/dL — ABNORMAL HIGH (ref <200)
HDL: 47 mg/dL — ABNORMAL LOW (ref >=50)
LDL Cholesterol (Calc): 147 mg/dL (calc) — ABNORMAL HIGH
Non-HDL Cholesterol (Calc): 173 mg/dL (calc) — ABNORMAL HIGH (ref <130)
Total CHOL/HDL Ratio: 4.7 (calc) (ref <5.0)
Triglycerides: 136 mg/dL (ref <150)

## 2021-09-26 ENCOUNTER — Telehealth: Payer: Self-pay | Admitting: Internal Medicine

## 2021-09-26 MED ORDER — ROSUVASTATIN CALCIUM 20 MG PO TABS: 20.0000 mg | ORAL_TABLET | Freq: Every day | ORAL | 1 refills | Status: DC

## 2021-09-26 NOTE — Telephone Encounter (Signed)
Pt called to report that the medication is making her sleep for 12 hrs, she is asking if she can take half instead of a whole. No symptoms reported, she just does not want to sleep for that long.  ? ?mirtazapine (REMERON) 7.5 MG tablet ?

## 2021-09-26 NOTE — Telephone Encounter (Signed)
Medication will not be effective at that dose. Can she take it earlier in the evening? ?

## 2021-09-26 NOTE — Addendum Note (Signed)
Addended by: Jearld Fenton on: 09/26/2021 03:32 PM ? ? Modules accepted: Orders ? ?

## 2021-09-27 NOTE — Telephone Encounter (Signed)
LMTCB 09/27/2021.  PEC Please advise pt when she calls back.  ? ?Thanks,  ? ?-Mickel Baas  ?

## 2021-09-30 ENCOUNTER — Other Ambulatory Visit: Payer: Self-pay

## 2021-09-30 MED ORDER — BUSPIRONE HCL 5 MG PO TABS: 5.0000 mg | ORAL_TABLET | Freq: Three times a day (TID) | ORAL | 1 refills | Status: DC

## 2021-09-30 MED ORDER — LOSARTAN POTASSIUM 100 MG PO TABS
100.0000 mg | ORAL_TABLET | Freq: Every day | ORAL | 1 refills | Status: DC
Start: 2021-09-30 — End: 2022-08-11

## 2021-09-30 MED ORDER — ROSUVASTATIN CALCIUM 20 MG PO TABS: 20.0000 mg | ORAL_TABLET | Freq: Every day | ORAL | 1 refills | Status: DC

## 2021-09-30 MED ORDER — HYDROCHLOROTHIAZIDE 25 MG PO TABS: 25.0000 mg | ORAL_TABLET | Freq: Every day | ORAL | 1 refills | Status: DC

## 2021-10-03 ENCOUNTER — Other Ambulatory Visit: Payer: Self-pay

## 2021-11-18 ENCOUNTER — Other Ambulatory Visit: Payer: Self-pay | Admitting: Internal Medicine

## 2021-11-18 NOTE — Telephone Encounter (Signed)
Requested by interface surescripts. medication discontinued 09/23/21.  Requested Prescriptions  Refused Prescriptions Disp Refills  . sertraline (ZOLOFT) 100 MG tablet [Pharmacy Med Name: SERTRALINE '100MG'$  TABLETS] 90 tablet     Sig: TAKE 1 TABLET BY MOUTH DAILY     Psychiatry:  Antidepressants - SSRI - sertraline Failed - 11/18/2021  3:42 AM      Failed - Completed PHQ-2 or PHQ-9 in the last 360 days      Passed - AST in normal range and within 360 days    AST  Date Value Ref Range Status  09/23/2021 17 10 - 35 U/L Final         Passed - ALT in normal range and within 360 days    ALT  Date Value Ref Range Status  09/23/2021 11 6 - 29 U/L Final         Passed - Valid encounter within last 6 months    Recent Outpatient Visits          1 month ago Mixed hyperlipidemia   [place], Coralie Keens, NP   6 months ago Functional diarrhea   [facility] [place], Coralie Keens, NP   10 months ago Medicare annual wellness visit, subsequent   [facility] [place], Coralie Keens, NP   1 year ago Anxiety and depression   [facility], Coralie Keens, NP      Future Appointments            In 1 month [place], Coralie Keens, NP [facility]

## 2021-12-27 ENCOUNTER — Ambulatory Visit: Payer: Medicare HMO | Admitting: Internal Medicine

## 2021-12-28 ENCOUNTER — Ambulatory Visit: Payer: Medicare HMO | Admitting: Neurology

## 2021-12-28 ENCOUNTER — Encounter: Payer: Self-pay | Admitting: Neurology

## 2021-12-28 VITALS — BP 160/114 | HR 67 | Ht 59.0 in | Wt 102.0 lb

## 2021-12-28 DIAGNOSIS — G3184 Mild cognitive impairment, so stated: Secondary | ICD-10-CM

## 2021-12-28 MED ORDER — MEMANTINE HCL 10 MG PO TABS: 10.0000 mg | ORAL_TABLET | Freq: Two times a day (BID) | ORAL | 1 refills | Status: DC

## 2021-12-28 NOTE — Patient Instructions (Addendum)
Start Namenda 10 mg twice daily memory  Consider smoking cessation  Monitor BP , start checking at home, is elevated today See you back in 6 months

## 2021-12-28 NOTE — Progress Notes (Signed)
Patient: [name] [name] Date of Birth: 1950/06/30  Reason for Visit: Follow up for memory History from: Patient Primary Neurologist: [name] [name]  ASSESSMENT AND PLAN 71 y.o. year old female   54.  Mild cognitive impairment -MMSE 22/30, overall stable, no major changes -Increase Namenda 10 mg twice daily  -Still dealing with a lot of stress, anxiety  -Consider neuro psych re-eval in 18 months from initial eval -BP elevated today, suspect stress/anxiety related, encouraged to monitor at home, discuss with PCP  -Not interested in smoking cessation -May consider future, HR 67 -I will see her back in 6 months or sooner if needed  HISTORY OF PRESENT ILLNESS: Today 12/28/21 [name] [name] is here today for follow-up.  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  Etiology difficult to determine, with underlying alcohol use, severe anxiety and moderate depression, sleep dysfunction.  These factors likely contributing.  Could not rule out early stages of neurodegenerative illness, i.e. Alzheimer's disease.  Recommended repeat neuropsych evaluation in 18 months.  Today, MMSE 22/30. Lives alone, going through divorce, they switch off between RV and home. Stress with this. Her kids lives across the country. She had new puppy, has been bright spot. Memory continues to decline, has to verbally give herself reminders. Pays her own bills, keeps up with medications, no issues. Mood is a struggle, is on Buspar, Remeron. Sleeping better with Remeron, only 1/2 tablet. Has done well with Namenda 5 mg twice daily, no side effects. Still smoking. 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 BP 160/90 manual.   HISTORY Copied Dr. Rexene [name]: Ms. [name] [name] is a 71 year old right-handed woman with an underlying medical history of hypertension, allergies, anxiety, depression, and excessive alcohol consumption, who presents for follow-up consultation of her memory loss.  The patient is accompanied by a  friend, [name] [name] (her best friend of over 43 years) today. I first met her at the request of her primary care nurse practitioner on 10/25/2020, at which time she reported a several year history of forgetfulness and getting lost while driving.  Her MMSE was 26 out of 30 at the time.  I suggested we proceed with additional work-up in the form of neuropsychological evaluation and brain MRI.  She had recent labs done through her primary care including vitamin B12 which was elevated at the time, TSH was 2.71 in March 2022.     She had a brain MRI with and without contrast on 11/07/2020 and I reviewed the results: IMPRESSION: This MRI of the brain with and without contrast shows the following: 1.  Mild generalized cortical atrophy, mildly progressed compared to the 03/22/2019 MRI. 2.  Some scattered punctate T2/FLAIR hyperintense foci in the hemispheres consistent with minimal chronic microvascular ischemic change, stable compared to the 2020 MRI. 3.  No acute findings. 4.  Normal enhancement pattern.   A referral was made to the [facility] for neuropsychological evaluation, October 2022 but did not make the appointment.  She saw Dr. Hazle [name] in April 2023.   I reviewed his impression and recommendations as outlined in his visit note from 08/24/2021: <<Clinical Impression(s): Scores across stand-alone and embedded performance validity measures were variable but largely within expectation. Given this variability, a mild degree of caution may be prudent when interpreting the results of cognitive testing.   If taken at face value, [name] [name]'s pattern of performance is suggestive of impairment across all aspects of verbal memory. Additional impairment was exhibited semantic fluency, while variable but overall below  expectation performances were noted across executive functioning and visual memory. Performances were appropriate relative to age-matched peers across processing speed, attention/concentration,  safety/judgment, receptive language, phonemic fluency, confrontation naming, and visuospatial abilities. [name] [name] largely denied difficulties completing instrumental activities of daily living (ADLs) independently. 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   The etiology of ongoing dysfunction is difficult to determine and likely mixed to some extent. Despite reporting significant alcohol reduction, she reported including a "splash" of Kahlua (estimated to be 0.5-1 shot in volume) within her morning coffee. Per her and her friend's estimations, she consumes 1-3 cups of coffee daily, which would suggest anywhere from 3.5 to 21 alcoholic beverages per week. As the CDC defines "heavy drinking" in women as 8 or more alcoholic beverages per week, [name] [name] is certainly at risk for exceeding this amount regularly. Persistent alcohol abuse will certainly worsen cognitive abilities and place her at an increased risk for the development of an alcohol-related dementia presentation in the future.    Outside of alcohol consumption, [name] [name] reported acute symptoms of severe anxiety and moderate depression. She also reported severe sleep dysfunction on a related questionnaire. Ongoing severe psychiatric distress can also create and worsen cognitive dysfunction, particularly affecting domains of processing speed, attention/concentration, executive functioning, and learning and memory. Chronic sleep deprivation can have a similar impact, as well as prominent psychosocial stress.    At the present time, it is unclear how much of her current deficits are related to the factors described above. Despite this, I certainly cannot rule out the early stages of a neurodegenerative illness, namely Alzheimer's disease. [name] [name] did not benefit from repeated exposure to new information, was fully  amnestic across 2/4 memory tasks, and exhibited retention rates ranging from 0% to 50%. This could suggest rapid forgetting and an emerging memory storage deficit, which are hallmark characteristics of this illness. Deficits in semantic fluency would also represent fairly typical disease progression. Overall, continued monitoring will be important moving forward.    She does not exhibit behavioral characteristics concerning for Lewy body dementia, frontotemporal dementia, or a more rare parkinsonian syndrome. Imaging did not reveal age-advanced cerebrovascular changes worrisome for a vascular dementia presentation.   Recommendations: A repeat neuropsychological evaluation in 18 months (or sooner if functional decline is noted) is recommended to assess the trajectory of future cognitive decline should it occur. This would ideally be accomplished once mood-related concerns are better managed and she has maintained sobriety from alcohol.   Around the time of her previous meeting with Dr. Rexene Alberts, Ms. Vannote reported complete alcohol cessation. I would strongly encourage her to resume this behavior and eliminate Kahlua from her morning coffee. As she reported using this as a way of sweetening this beverage, I would encourage her to trial other non-alcoholic sweetening alternatives.    If desired, Ms. Levitan could discuss memory-based medication options with Dr. Rexene [name] given ongoing deficits. It is important to highlight that this medication has been shown to slow functional decline in some individuals. There is no current treatment which can stop or reverse cognitive decline when caused by a neurodegenerative illness.    A combination of medication and psychotherapy has been shown to be most effective at treating symptoms of anxiety and depression. As such, Ms. Mayhall is encouraged to speak with her prescribing physician regarding medication adjustments to optimally manage these symptoms. She should also discuss  ways  to improve her sleep with this physician.    I would strongly encourage her to engage in individual psychotherapy to address severe psychiatric distress. Marital counseling in addition to individual counseling would also be beneficial given marital discord representing a significant life stressor; however, this would require buy-in from her husband. She would benefit from an active and collaborative therapeutic environment, rather than one purely supportive in nature. Recommended treatment modalities include Cognitive Behavioral Therapy (CBT) or Acceptance and Commitment Therapy (ACT).   Ms. Roselli is encouraged to attend to lifestyle factors for brain health (e.g., regular physical exercise, good nutrition habits, regular participation in cognitively-stimulating activities, and general stress management techniques), which are likely to have benefits for both emotional adjustment and cognition. In fact, in addition to promoting good general health, regular exercise incorporating aerobic activities (e.g., brisk walking, jogging, cycling, etc.) has been demonstrated to be a very effective treatment for depression and stress, with similar efficacy rates to both antidepressant medication and psychotherapy. Optimal control of vascular risk factors (including safe cardiovascular exercise and adherence to dietary recommendations) is encouraged. Continued participation in activities which provide mental stimulation and social interaction is also recommended.    When learning new information, she would benefit from information being broken up into small, manageable pieces. She may also find it helpful to articulate the material in her own words and in a context to promote encoding at the onset of a new task. This material may need to be repeated multiple times to promote encoding.   Memory can be improved using internal strategies such as rehearsal, repetition, chunking, mnemonics, association, and imagery.  External strategies such as written notes in a consistently used memory journal, visual and nonverbal auditory cues such as a calendar on the refrigerator or appointments with alarm, such as on a cell phone, can also help maximize recall.     To address problems with fluctuating attention, she may wish to consider:   -Avoiding external distractions when needing to concentrate   -Limiting exposure to fast paced environments with multiple sensory demands   -Writing down complicated information and using checklists   -Attempting and completing one task at a time (i.e., no multi-tasking)   -Verbalizing aloud each step of a task to maintain focus   -Reducing the amount of information considered at one time>>     Today, 09/21/2021:  She reports that her memory is worse.  She feels frustrated as she has been more forgetful.  She talks out loud to remind herself what to do and how to multitask.  She has ongoing stress.  She is on sertraline and BuSpar per primary care but has not had a recent checkup.  She has not talked about stress management with her primary care, she has not pursued therapy.  She reports ongoing marital stress.  She has not been eating well.  Sometimes she forgets to eat.  Sometimes she eats very little.  She drinks very little water but likes to drink green tea which she makes herself.  She drinks no alcohol daily, she quit drinking alcohol altogether about 2 months ago.  She has had significant weight loss in the past 3 months.  She has not been able to quit smoking.  Her friend reports that she is worried about her stress and weight loss and that she still worries about her smoking.  Her friend also reports that patient was adding alcohol to three-quarter to her coffee.  Patient reports that she was adding this for sweetener  but that she has stopped it in the past month or longer.     Previously:    10/25/20: (She) reports forgetfulness as well as getting lost while driving.  Symptoms  have worsened over the past 4 to 5 years.  She reports that her father had memory issues in his later years, he lived to be around 33.  Her mom lived to be 80 and did not have any memory issues but patient reports that mom had a problem with drinking alcohol.  She has been under stress, she reports marital issues.  She is not separated, husband lives with her, they have been married for over 48 years but she reports that he has not been faithful.  She has not been in marital counseling.  She has had some group therapy she reports.  She has 2 grown children, daughter lives in [place] state and son lives in [place].  She has a brother in [place] and a sister in [place], neither with memory issues.  She does not sleep well currently, she does not take any medication to help her sleep.  She quit drinking wine/alcohol some 3 to 4 weeks ago completely.  She reports residual depression and anxiety.  She is currently on BuSpar and sertraline, she denies any suicidal ideations. I reviewed your office note from 07/29/2020.  She endorsed anxiety and depression and excessive alcohol consumption at the time.  She was referred to psychology.  She had blood work through your office on 07/29/2020 and I reviewed the results: CBC showed RBC of 3.72, hemoglobin normal at 12.3, hematocrit normal at 36.1.  CMP showed benign findings, sodium 138, potassium 4.6, glucose 94, BUN 11, creatinine 0.72.  TSH was normal at 2.71.  Vitamin D was normal at 49.98.  Vitamin B12 was 1506.  She was advised to reduce her vitamin B-12 supplementation.  She has since then also been referred to psychiatry.   She had a brain MRI without contrast on 03/23/2019 through [facility].  Indication was memory loss for 1 year.  I reviewed the test results: IMPRESSION: 1. Mild generalized parenchymal atrophy. 2. Otherwise unremarkable MRI appearance of the brain for age.   She reports loss of appetite.  She has lost weight but  recently gained about 3 pounds back.  She does not drink a whole lot of water, likes to drink unsweetened green tea which she believes herself.  She drinks coffee in the morning, about 2 cups/day.  She smokes cigarettes, she has recently increased her smoking secondary to stress.  She smokes (corrected on 09/21/2021) more than 1 pack/day currently.  She retired as a Pharmacist, hospital.  She lives with her husband.  REVIEW OF SYSTEMS: Out of a complete 14 system review of symptoms, the patient complains only of the following symptoms, and all other reviewed systems are negative.  See HPI  ALLERGIES: Allergies  Allergen Reactions   Erythromycin Other (See Comments)    REACTION: GI upset   Penicillins Nausea And Vomiting    HOME MEDICATIONS: Outpatient Medications Prior to Visit  Medication Sig Dispense Refill   aspirin 81 MG EC tablet Take 1 tablet (81 mg total) by mouth daily. Swallow whole. 30 tablet 12   busPIRone (BUSPAR) 5 MG tablet Take 1 tablet (5 mg total) by mouth 3 (three) times daily. 270 tablet 1   Cholecalciferol (VITAMIN D3) 50 MCG (2000 UT) TABS Take 1 tablet by mouth daily.     hydrochlorothiazide (HYDRODIURIL) 25 MG tablet Take 1  tablet (25 mg total) by mouth daily. 90 tablet 1   losartan (COZAAR) 100 MG tablet Take 1 tablet (100 mg total) by mouth daily. 90 tablet 1   memantine (NAMENDA) 5 MG tablet Take 1 tablet (5 mg total) by mouth 2 (two) times daily. Follow titration instructions provided. (Patient not taking: Reported on 09/23/2021) 60 tablet 5   mirtazapine (REMERON) 7.5 MG tablet Take 1 tablet (7.5 mg total) by mouth at bedtime. 30 tablet 2   Probiotic Product (TRUNATURE DIGESTIVE PROBIOTIC PO) Take 1 tablet by mouth daily at 2 PM.     rosuvastatin (CRESTOR) 20 MG tablet Take 1 tablet (20 mg total) by mouth daily. 90 tablet 1   traMADol (ULTRAM) 50 MG tablet TAKE 1 TABLET BY MOUTH EVERY 8 HOURS AS NEEDED FOR PAIN (Patient taking differently: as needed.) 30 tablet 0   No  facility-administered medications prior to visit.    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, sacral and sacrococcygeal region 10/16/2017    PAST SURGICAL HISTORY: Past Surgical History:  Procedure Laterality Date   ABLATION     uterine   ANAL FISSURE REPAIR     age 37 and college   COLONOSCOPY     DILATION AND CURETTAGE OF UTERUS     HEMORRHOID BANDING  06/02/2013   TONSILLECTOMY     age 79    FAMILY HISTORY: Family History  Problem Relation Age of Onset   Diabetes Mother    Stroke Mother    Other Mother        colon sugery   Polymyalgia rheumatica Mother    Memory loss Mother        late 56s   Alcoholism Mother    Memory loss Father    Heart disease Father    Diabetes Father    Dementia Father        unspecified type; late 70s/early 59s   Colon cancer Neg Hx    Cancer Neg Hx    Esophageal cancer Neg Hx    Pancreatic cancer Neg Hx    Stomach cancer Neg Hx     SOCIAL HISTORY: Social History   Socioeconomic History   Marital status: Married    Spouse name: Not on file   Number of children: 2   Years of education: 16   Highest education level: Bachelor's degree (e.g., BA, AB, BS)  Occupational History   Occupation: Retired    Comment: Education officer, museum  Tobacco Use   Smoking status: Every Day    Packs/day: 1.00    Types: Cigarettes   Smokeless tobacco: Never  Vaping Use   Vaping Use: Former  Substance and Sexual Activity   Alcohol use: Not Currently    Comment: occ   Drug use: No   Sexual activity: Not Currently    Birth control/protection: Post-menopausal  Other Topics Concern   Not on file   Social History Narrative   Not on file   Social Determinants of Health   Financial Resource Strain: Not on file  Food Insecurity: Not on file  Transportation Needs: Not on file  Physical Activity: Not on file  Stress: Not  on file  Social Connections: Not on file  Intimate Partner Violence: Not on file   PHYSICAL EXAM  Vitals:   12/28/21 1114  BP: (!) 160/114  Pulse: 67  Weight: 102 lb (46.3 kg)  Height: '4\' 11"'  (1.499 m)   Body mass index is 20.6 kg/m.    12/28/2021   11:21 AM 09/21/2021    9:47 AM 10/25/2020    2:21 PM  MMSE - Mini Mental State Exam  Orientation to time '2 4 5  ' Orientation to Place '5 4 5  ' Registration '3 3 3  ' Attention/ Calculation 0 1 3  Recall '3 2 2  ' Language- name 2 objects '2 2 2  ' Language- repeat '1 1 1  ' Language- follow 3 step command '3 3 2  ' Language- read & follow direction '1 1 1  ' Write a sentence '1 1 1  ' Copy design '1 1 1  ' Total score '22 23 26   ' Generalized: Well developed, in no acute distress  Neurological examination  Mentation: Alert oriented to time, place, history taking. Follows all commands speech and language fluent Cranial nerve II-XII: Pupils were equal round reactive to light. Extraocular movements were full, visual field were full on confrontational test. Facial sensation and strength were normal. Head turning and shoulder shrug  were normal and symmetric. Motor: The motor testing reveals 5 over 5 strength of all 4 extremities. Good symmetric motor tone is noted throughout.  Sensory: Sensory testing is intact to soft touch on all 4 extremities. No evidence of extinction is noted.  Coordination: Cerebellar testing reveals good finger-nose-finger and heel-to-shin bilaterally.  Gait and station: Gait is normal. Tandem gait is normal.  Reflexes: Deep tendon reflexes are symmetric and normal bilaterally.   DIAGNOSTIC DATA (LABS, IMAGING, TESTING) - I reviewed patient records, labs, notes, testing and imaging myself where  available.  Lab Results  Component Value Date   WBC 8.1 05/13/2021   HGB 13.3 05/13/2021   HCT 39.7 05/13/2021   MCV 97.3 05/13/2021   PLT 262 05/13/2021      Component Value Date/Time   NA 135 09/23/2021 1543   K 3.6 09/23/2021 1543   CL 99 09/23/2021 1543   CO2 26 09/23/2021 1543   GLUCOSE 83 09/23/2021 1543   BUN 10 09/23/2021 1543   CREATININE 0.77 09/23/2021 1543   CALCIUM 9.4 09/23/2021 1543   PROT 6.9 09/23/2021 1543   ALBUMIN 4.1 07/29/2020 1601   AST 17 09/23/2021 1543   ALT 11 09/23/2021 1543   ALKPHOS 53 07/29/2020 1601   BILITOT 0.5 09/23/2021 1543   GFRNONAA >60 01/01/2020 0405   GFRAA >60 01/01/2020 0405   Lab Results  Component Value Date   CHOL 220 (H) 09/23/2021   HDL 47 (L) 09/23/2021   LDLCALC 147 (H) 09/23/2021   TRIG 136 09/23/2021   CHOLHDL 4.7 09/23/2021   Lab Results  Component Value Date   HGBA1C 5.7 04/13/2015   Lab Results  Component Value Date   VITAMINB12 >1506 (H) 07/29/2020   Lab Results  Component Value Date   TSH 2.65 05/13/2021    Butler Denmark, AGNP-C, DNP 12/28/2021, 11:29 AM [facility] [address] [place], [place] [address] [phone]

## 2022-01-10 ENCOUNTER — Ambulatory Visit (INDEPENDENT_AMBULATORY_CARE_PROVIDER_SITE_OTHER): Payer: Self-pay | Admitting: Internal Medicine

## 2022-01-10 DIAGNOSIS — R7303 Prediabetes: Secondary | ICD-10-CM | POA: Insufficient documentation

## 2022-01-10 DIAGNOSIS — Z91199 Patient's noncompliance with other medical treatment and regimen due to unspecified reason: Secondary | ICD-10-CM

## 2022-01-10 NOTE — Progress Notes (Unsigned)
Subjective:    Patient ID: [name] [name], female    DOB: [birth year], 71 y.o.   MRN: [id number]  HPI  Patient presents to clinic today for her annual exam.  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 Swallow whole. 30 tablet 12   busPIRone (BUSPAR) 5 MG tablet Take 1 tablet (5 mg total) by mouth 3 (three) times daily. 270 tablet 1   Cholecalciferol (VITAMIN D3) 50 MCG (2000 UT) TABS Take 1 tablet by mouth daily.     hydrochlorothiazide (HYDRODIURIL) 25 MG tablet Take 1 tablet (25 mg total) by mouth daily. 90 tablet 1   losartan (COZAAR) 100 MG tablet Take 1 tablet (100 mg total) by mouth daily. 90 tablet 1   memantine (NAMENDA) 10 MG tablet Take 1 tablet (10 mg total) by mouth 2 (two) times daily. 180 tablet 1   mirtazapine (REMERON) 7.5 MG tablet Take 1 tablet (7.5 mg total) by mouth at bedtime. 30 tablet 2   Probiotic Product (TRUNATURE DIGESTIVE PROBIOTIC PO) Take 1 tablet by mouth daily at 2 PM.     rosuvastatin (CRESTOR) 20 MG tablet Take 1 tablet (20 mg total) by mouth daily. 90 tablet 1   traMADol (ULTRAM) 50 MG tablet TAKE 1 TABLET BY MOUTH EVERY 8 HOURS AS NEEDED FOR PAIN (Patient taking differently: as needed.) 30 tablet 0   No  current facility-administered medications for this visit.    Allergies  Allergen Reactions   Erythromycin Other (See Comments)    REACTION: GI upset   Penicillins Nausea And Vomiting    Family History  Problem Relation Age of Onset   Diabetes Mother    Stroke Mother    Other Mother        colon sugery   Polymyalgia rheumatica Mother    Memory loss Mother        late 34s   Alcoholism Mother    Memory loss Father    Heart disease Father    Diabetes Father    Dementia Father        unspecified type; late 70s/early 34s   Colon cancer Neg Hx    Cancer Neg Hx    Esophageal cancer Neg Hx    Pancreatic cancer Neg Hx    Stomach cancer Neg Hx     Social History   Socioeconomic History   Marital status: Married    Spouse name: Not on file   Number of children: 2   Years of education: 16   Highest education level: Bachelor's degree (e.g., BA, AB, BS)  Occupational History   Occupation: Retired    Comment: Education officer, museum  Tobacco Use   Smoking status: Every Day    Packs/day: 1.00    Types: Cigarettes   Smokeless tobacco: Never  Vaping Use  Vaping Use: Former  Substance and Sexual Activity   Alcohol use: Not Currently    Comment: occ   Drug use: No   Sexual activity: Not Currently    Birth control/protection: Post-menopausal  Other Topics Concern   Not on file  Social History Narrative   Not on file   Social Determinants of Health   Financial Resource Strain: Not on file  Food Insecurity: Not on file  Transportation Needs: Not on file  Physical Activity: Not on file  Stress: Not on file  Social Connections: Not on file  Intimate Partner Violence: Not on file     Constitutional: Denies fever, malaise, fatigue, headache or abrupt weight changes.  HEENT: Denies eye pain, eye redness, ear pain, ringing in the ears, wax buildup, runny nose, nasal congestion, bloody nose, or sore throat. Respiratory: Denies difficulty breathing, shortness of breath, cough or  sputum production.   Cardiovascular: Denies chest pain, chest tightness, palpitations or swelling in the hands or feet.  Gastrointestinal: Patient reports chronic diarrhea.  Denies abdominal pain, bloating, constipation, or blood in the stool.  GU: Denies urgency, frequency, pain with urination, burning sensation, blood in urine, odor or discharge. Musculoskeletal: Patient reports joint pain.  Denies decrease in range of motion, difficulty with gait, muscle pain or joint swelling.  Skin: Denies redness, rashes, lesions or ulcercations.  Neurological: Patient reports difficulty with memory.  Denies dizziness, difficulty with speech or problems with balance and coordination.  Psych: Patient has a history of anxiety and depression.  Denies SI/HI.  No other specific complaints in a complete review of systems (except as listed in HPI above).     Objective:   Physical Exam  There were no vitals taken for this visit. Wt Readings from Last 3 Encounters:  12/28/21 102 lb (46.3 kg)  09/23/21 102 lb (46.3 kg)  09/21/21 100 lb 6.4 oz (45.5 kg)    General: Appears their stated age, well developed, well nourished in NAD. Skin: Warm, dry and intact. No rashes, lesions or ulcerations noted. HEENT: Head: normal shape and size; Eyes: sclera white, no icterus, conjunctiva pink, PERRLA and EOMs intact; Ears: Tm's gray and intact, normal light reflex; Nose: mucosa pink and moist, septum midline; Throat/Mouth: Teeth present, mucosa pink and moist, no exudate, lesions or ulcerations noted.  Neck:  Neck supple, trachea midline. No masses, lumps or thyromegaly present.  Cardiovascular: Normal rate and rhythm. S1,S2 noted.  No murmur, rubs or gallops noted. No JVD or BLE edema. No carotid bruits noted. Pulmonary/Chest: Normal effort and positive vesicular breath sounds. No respiratory distress. No wheezes, rales or ronchi noted.  Abdomen: Soft and nontender. Normal bowel sounds. No distention or masses noted.  Liver, spleen and kidneys non palpable. Musculoskeletal: Normal range of motion. No signs of joint swelling. No difficulty with gait.  Neurological: Alert and oriented. Cranial nerves II-XII grossly intact. Coordination normal.  Psychiatric: Mood and affect normal. Behavior is normal. Judgment and thought content normal.    BMET    Component Value Date/Time   NA 135 09/23/2021 1543   K 3.6 09/23/2021 1543   CL 99 09/23/2021 1543   CO2 26 09/23/2021 1543   GLUCOSE 83 09/23/2021 1543   BUN 10 09/23/2021 1543   CREATININE 0.77 09/23/2021 1543   CALCIUM 9.4 09/23/2021 1543   GFRNONAA >60 01/01/2020 0405   GFRAA >60 01/01/2020 0405    Lipid Panel     Component Value Date/Time   CHOL 220 (H) 09/23/2021 1543   TRIG  136 09/23/2021 1543   HDL 47 (L) 09/23/2021 1543   CHOLHDL 4.7 09/23/2021 1543   VLDL 33.8 05/13/2019 1228   LDLCALC 147 (H) 09/23/2021 1543    CBC    Component Value Date/Time   WBC 8.1 05/13/2021 1437   RBC 4.08 05/13/2021 1437   HGB 13.3 05/13/2021 1437   HCT 39.7 05/13/2021 1437   PLT 262 05/13/2021 1437   MCV 97.3 05/13/2021 1437   MCH 32.6 05/13/2021 1437   MCHC 33.5 05/13/2021 1437   RDW 13.1 05/13/2021 1437   LYMPHSABS 1.5 12/31/2019 0904   MONOABS 1.0 12/31/2019 0904   EOSABS 0.1 12/31/2019 0904   BASOSABS 0.0 12/31/2019 0904    Hgb A1C Lab Results  Component Value Date   HGBA1C 5.7 04/13/2015            Assessment & Plan:   Preventative Health Maintenance:  Encouraged her to get a flu shot in the fall Tetanus UTD Return to get her COVID booster Pneumovax and Prevnar UTD Encouraged her to get her second Shingrix vaccine She no longer needs Pap smears Mammogram and bone density ordered-she will call to schedule Colon screening UTD Encourage urged her to consume a balanced diet and exercise regimen Advised her to see an eye doctor and dentist annually Check CBC, c-Met, lipid, A1c today  RTC in 6 months, follow-up chronic  conditions Webb Silversmith, NP

## 2022-01-24 ENCOUNTER — Ambulatory Visit: Payer: Medicare HMO | Admitting: Internal Medicine

## 2022-01-27 ENCOUNTER — Ambulatory Visit: Payer: Medicare HMO | Admitting: Internal Medicine

## 2022-02-08 ENCOUNTER — Telehealth: Payer: Self-pay | Admitting: Internal Medicine

## 2022-02-08 NOTE — Telephone Encounter (Signed)
N/A unable to leave a message for patient to call back and schedule the Medicare Annual Wellness Visit (AWV) virtually or by telephone.  Last AWV 01/04/21  Please schedule at anytime with [place].    Any questions, please call me at [phone]

## 2022-02-10 ENCOUNTER — Telehealth: Payer: Self-pay

## 2022-02-10 ENCOUNTER — Encounter: Payer: Self-pay | Admitting: Internal Medicine

## 2022-02-10 ENCOUNTER — Ambulatory Visit (INDEPENDENT_AMBULATORY_CARE_PROVIDER_SITE_OTHER): Payer: Medicare HMO | Admitting: Internal Medicine

## 2022-02-10 VITALS — BP 136/76 | HR 72 | Temp 97.5°F | Wt 105.0 lb

## 2022-02-10 DIAGNOSIS — R7303 Prediabetes: Secondary | ICD-10-CM | POA: Diagnosis not present

## 2022-02-10 DIAGNOSIS — E2839 Other primary ovarian failure: Secondary | ICD-10-CM

## 2022-02-10 DIAGNOSIS — Z0001 Encounter for general adult medical examination with abnormal findings: Secondary | ICD-10-CM

## 2022-02-10 DIAGNOSIS — Z1231 Encounter for screening mammogram for malignant neoplasm of breast: Secondary | ICD-10-CM | POA: Diagnosis not present

## 2022-02-10 DIAGNOSIS — Z23 Encounter for immunization: Secondary | ICD-10-CM | POA: Diagnosis not present

## 2022-02-10 DIAGNOSIS — E785 Hyperlipidemia, unspecified: Secondary | ICD-10-CM | POA: Diagnosis not present

## 2022-02-10 DIAGNOSIS — K529 Noninfective gastroenteritis and colitis, unspecified: Secondary | ICD-10-CM | POA: Diagnosis not present

## 2022-02-10 NOTE — Assessment & Plan Note (Signed)
Will check Alpha Gal today

## 2022-02-10 NOTE — Progress Notes (Signed)
Subjective:    Patient ID: [name] [name], female    DOB: 02-03-1951, 71 y.o.   MRN: [id number]  HPI  Patient presents to clinic today for her annual exam.  Flu: 02/2019 Tetanus: 07/2013 COVID: [place] x2 Pneumovax: 02/2019 Prevnar: 10/2017 Shingrix: 02/2019 Pap smear: 11/2012 Mammogram: unsure Bone density: unsure Colon screening: 05/2021 Vision screening: annually Dentist: biannually  Diet:  She does eat meat. She consumes fruits and veggies. She tries to avoid fried foods. She drinks mostly unsweet green tea. 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 Swallow whole. 30 tablet 12   busPIRone (BUSPAR) 5 MG tablet Take 1 tablet (5 mg total) by mouth 3 (three) times daily. 270 tablet 1   Cholecalciferol (VITAMIN D3) 50 MCG (2000 UT) TABS Take 1 tablet by mouth daily.     hydrochlorothiazide (HYDRODIURIL) 25 MG tablet Take 1 tablet (25 mg total) by mouth daily. 90 tablet 1   losartan (COZAAR) 100 MG tablet Take 1 tablet (100 mg total) by mouth daily. 90 tablet 1   memantine (NAMENDA) 10 MG tablet Take 1 tablet (10 mg total) by mouth 2 (two) times daily. 180 tablet 1   mirtazapine (REMERON) 7.5 MG tablet Take 1 tablet (7.5 mg total) by mouth at bedtime. 30 tablet 2   Probiotic Product (TRUNATURE DIGESTIVE PROBIOTIC PO) Take 1 tablet by mouth daily at 2 PM.     rosuvastatin (CRESTOR) 20 MG tablet Take 1 tablet (20 mg total) by mouth  daily. 90 tablet 1   traMADol (ULTRAM) 50 MG tablet TAKE 1 TABLET BY MOUTH EVERY 8 HOURS AS NEEDED FOR PAIN (Patient taking differently: as needed.) 30 tablet 0   No current facility-administered medications for this visit.    Allergies  Allergen Reactions   Erythromycin Other (See Comments)    REACTION: GI upset   Penicillins Nausea And Vomiting    Family History  Problem Relation Age of Onset   Diabetes Mother    Stroke Mother    Other Mother        colon sugery   Polymyalgia rheumatica Mother    Memory loss Mother        late 61s   Alcoholism Mother    Memory loss Father    Heart disease Father    Diabetes Father    Dementia Father        unspecified type; late 70s/early 37s   Colon cancer Neg Hx    Cancer Neg Hx    Esophageal cancer Neg Hx    Pancreatic cancer Neg Hx    Stomach cancer Neg Hx     Social History   Socioeconomic History   Marital status: Married    Spouse name: Not on file   Number of children: 2   Years of education: 16   Highest education level: Bachelor's degree (e.g., BA, AB, BS)  Occupational History   Occupation: Retired  Comment: school teacher  Tobacco Use   Smoking status: Every Day    Packs/day: 1.00    Types: Cigarettes   Smokeless tobacco: Never  Vaping Use   Vaping Use: Former  Substance and Sexual Activity   Alcohol use: Not Currently    Comment: occ   Drug use: No   Sexual activity: Not Currently    Birth control/protection: Post-menopausal  Other Topics Concern   Not on file  Social History Narrative   Not on file   Social Determinants of Health   Financial Resource Strain: Not on file  Food Insecurity: Not on file  Transportation Needs: Not on file  Physical Activity: Not on file  Stress: Not on file  Social Connections: Not on file  Intimate Partner Violence: Not on file     Constitutional: Denies fever, malaise, fatigue, headache or abrupt weight changes.  HEENT: Denies eye pain, eye redness, ear pain,  ringing in the ears, wax buildup, runny nose, nasal congestion, bloody nose, or sore throat. Respiratory: Denies difficulty breathing, shortness of breath, cough or sputum production.   Cardiovascular: Denies chest pain, chest tightness, palpitations or swelling in the hands or feet.  Gastrointestinal: Patient reports diarrhea.  Denies abdominal pain, bloating, constipation, or blood in the stool.  GU: Denies urgency, frequency, pain with urination, burning sensation, blood in urine, odor or discharge. Musculoskeletal: Patient reports joint pain.  Denies decrease in range of motion, difficulty with gait, muscle pain or joint swelling.  Skin: Denies redness, rashes, lesions or ulcercations.  Neurological: Patient reports memory loss.  Denies dizziness, difficulty with speech or problems with balance and coordination.  Psych: Patient has a history of anxiety and depression.  Denies SI/HI.  No other specific complaints in a complete review of systems (except as listed in HPI above).  Objective:   Physical Exam   BP (!) 146/82 (BP Location: Left Arm, Patient Position: Sitting, Cuff Size: Normal)   Pulse 72   Temp (!) 97.5 F (36.4 C) (Temporal)   Wt 105 lb (47.6 kg)   SpO2 100%   BMI 21.21 kg/m   Wt Readings from Last 3 Encounters:  12/28/21 102 lb (46.3 kg)  09/23/21 102 lb (46.3 kg)  09/21/21 100 lb 6.4 oz (45.5 kg)    General: Appears her stated age, well developed, well nourished in NAD. Skin: Warm, dry and intact.  HEENT: Head: normal shape and size; Eyes: sclera white, no icterus, conjunctiva pink, PERRLA and EOMs intact;  Neck:  Neck supple, trachea midline. No masses, lumps or thyromegaly present.  Cardiovascular: Normal rate and rhythm. S1,S2 noted.  No murmur, rubs or gallops noted. No JVD or BLE edema. No carotid bruits noted. Pulmonary/Chest: Normal effort and positive vesicular breath sounds. No respiratory distress. No wheezes, rales or ronchi noted.  Abdomen: Normal  bowel sounds.  Musculoskeletal: Strength 5/5 BUE/BLE. No difficulty with gait.  Neurological: Alert and oriented. Cranial nerves II-XII grossly intact. Coordination normal.  Psychiatric: Mood and affect normal. Behavior is normal. Judgment and thought content normal.    BMET    Component Value Date/Time   NA 135 09/23/2021 1543   K 3.6 09/23/2021 1543   CL 99 09/23/2021 1543   CO2 26 09/23/2021 1543   GLUCOSE 83 09/23/2021 1543   BUN 10 09/23/2021 1543   CREATININE 0.77 09/23/2021 1543   CALCIUM 9.4 09/23/2021 1543   GFRNONAA >60 01/01/2020 0405   GFRAA >60 01/01/2020 0405    Lipid Panel     Component  Value Date/Time   CHOL 220 (H) 09/23/2021 1543   TRIG 136 09/23/2021 1543   HDL 47 (L) 09/23/2021 1543   CHOLHDL 4.7 09/23/2021 1543   VLDL 33.8 05/13/2019 1228   LDLCALC 147 (H) 09/23/2021 1543    CBC    Component Value Date/Time   WBC 8.1 05/13/2021 1437   RBC 4.08 05/13/2021 1437   HGB 13.3 05/13/2021 1437   HCT 39.7 05/13/2021 1437   PLT 262 05/13/2021 1437   MCV 97.3 05/13/2021 1437   MCH 32.6 05/13/2021 1437   MCHC 33.5 05/13/2021 1437   RDW 13.1 05/13/2021 1437   LYMPHSABS 1.5 12/31/2019 0904   MONOABS 1.0 12/31/2019 0904   EOSABS 0.1 12/31/2019 0904   BASOSABS 0.0 12/31/2019 0904    Hgb A1C Lab Results  Component Value Date   HGBA1C 5.7 04/13/2015           Assessment & Plan:   Preventative Health Maintenance:  Flu shot today Tetanus UTD Encouraged her to get her COVID booster Pneumovax and Prevnar UTD Discussed Shingrix vaccine, she will check coverage with her insurance company schedule a visit at the pharmacy if she would like to have this done She no longer needs Pap smears Mammogram and bone density ordered-she will call to schedule Colon screening UTD Encouraged her to consume a balanced diet and exercise regimen Advised her to see an eye doctor and dentist annually We will check CBC, c-Met, lipid, A1c today  RTC in 6 months,  follow-up chronic conditions Webb Silversmith, NP

## 2022-02-10 NOTE — Patient Instructions (Signed)
Health Maintenance After Age 69 After age 69, you are at a higher risk for certain long-term diseases and infections as well as injuries from falls. Falls are a major cause of broken bones and head injuries in people who are older than age 69. Getting regular preventive care can help to keep you healthy and well. Preventive care includes getting regular testing and making lifestyle changes as recommended by your health care provider. Talk with your health care provider about: Which screenings and tests you should have. A screening is a test that checks for a disease when you have no symptoms. A diet and exercise plan that is right for you. What should I know about screenings and tests to prevent falls? Screening and testing are the best ways to find a health problem early. Early diagnosis and treatment give you the best chance of managing medical conditions that are common after age 69. Certain conditions and lifestyle choices may make you more likely to have a fall. Your health care provider may recommend: Regular vision checks. Poor vision and conditions such as cataracts can make you more likely to have a fall. If you wear glasses, make sure to get your prescription updated if your vision changes. Medicine review. Work with your health care provider to regularly review all of the medicines you are taking, including over-the-counter medicines. Ask your health care provider about any side effects that may make you more likely to have a fall. Tell your health care provider if any medicines that you take make you feel dizzy or sleepy. Strength and balance checks. Your health care provider may recommend certain tests to check your strength and balance while standing, walking, or changing positions. Foot health exam. Foot pain and numbness, as well as not wearing proper footwear, can make you more likely to have a fall. Screenings, including: Osteoporosis screening. Osteoporosis is a condition that causes  the bones to get weaker and break more easily. Blood pressure screening. Blood pressure changes and medicines to control blood pressure can make you feel dizzy. Depression screening. You may be more likely to have a fall if you have a fear of falling, feel depressed, or feel unable to do activities that you used to do. Alcohol use screening. Using too much alcohol can affect your balance and may make you more likely to have a fall. Follow these instructions at home: Lifestyle Do not drink alcohol if: Your health care provider tells you not to drink. If you drink alcohol: Limit how much you have to: 0-1 drink a day for women. 0-2 drinks a day for men. Know how much alcohol is in your drink. In the [place] one drink equals one 12 oz bottle of beer (355 mL), one 5 oz glass of wine (148 mL), or one 1 oz glass of hard liquor (44 mL). Do not use any products that contain nicotine or tobacco. These products include cigarettes, chewing tobacco, and vaping devices, such as e-cigarettes. If you need help quitting, ask your health care provider. Activity  Follow a regular exercise program to stay fit. This will help you maintain your balance. Ask your health care provider what types of exercise are appropriate for you. If you need a cane or walker, use it as recommended by your health care provider. Wear supportive shoes that have nonskid soles. Safety  Remove any tripping hazards, such as rugs, cords, and clutter. Install safety equipment such as grab bars in bathrooms and safety rails on stairs. Keep rooms and walkways   well-lit. General instructions Talk with your health care provider about your risks for falling. Tell your health care provider if: You fall. Be sure to tell your health care provider about all falls, even ones that seem minor. You feel dizzy, tiredness (fatigue), or off-balance. Take over-the-counter and prescription medicines only as told by your health care provider. These include  supplements. Eat a healthy diet and maintain a healthy weight. A healthy diet includes low-fat dairy products, low-fat (lean) meats, and fiber from whole grains, beans, and lots of fruits and vegetables. Stay current with your vaccines. Schedule regular health, dental, and eye exams. Summary Having a healthy lifestyle and getting preventive care can help to protect your health and wellness after age 69. Screening and testing are the best way to find a health problem early and help you avoid having a fall. Early diagnosis and treatment give you the best chance for managing medical conditions that are more common for people who are older than age 69. Falls are a major cause of broken bones and head injuries in people who are older than age 69. Take precautions to prevent a fall at home. Work with your health care provider to learn what changes you can make to improve your health and wellness and to prevent falls. This information is not intended to replace advice given to you by your health care provider. Make sure you discuss any questions you have with your health care provider. Document Revised: 09/20/2020 Document Reviewed: 09/20/2020 Elsevier Patient Education  2023 [employer] Inc[employer]  

## 2022-02-10 NOTE — Telephone Encounter (Signed)
Patient came back after appointment and reported she forgot to talk to [facility] about her memory.  She would like to know if there is anything else she can do to help her memory.  Please advise

## 2022-02-13 LAB — COMPLETE METABOLIC PANEL WITH GFR
AG Ratio: 1.5 (calc) (ref 1.0–2.5)
ALT: 8 U/L (ref 6–29)
AST: 18 U/L (ref 10–35)
Albumin: 4.4 g/dL (ref 3.6–5.1)
Alkaline phosphatase (APISO): 60 U/L (ref 37–153)
BUN: 11 mg/dL (ref 7–25)
CO2: 26 mmol/L (ref 20–32)
Calcium: 9.7 mg/dL (ref 8.6–10.4)
Chloride: 105 mmol/L (ref 98–110)
Creat: 0.75 mg/dL (ref 0.50–1.05)
Globulin: 3 g/dL (calc) (ref 1.9–3.7)
Glucose, Bld: 85 mg/dL (ref 65–139)
Potassium: 3.6 mmol/L (ref 3.5–5.3)
Sodium: 139 mmol/L (ref 135–146)
Total Bilirubin: 0.8 mg/dL (ref 0.2–1.2)
Total Protein: 7.4 g/dL (ref 6.1–8.1)
eGFR: 86 mL/min/{1.73_m2} (ref >=60)

## 2022-02-13 LAB — CBC
HCT: 39.2 % (ref 35.0–45.0)
Hemoglobin: 13.3 g/dL (ref 11.7–15.5)
MCH: 33.2 pg — ABNORMAL HIGH (ref 27.0–33.0)
MCHC: 33.9 g/dL (ref 32.0–36.0)
MCV: 97.8 fL (ref 80.0–100.0)
MPV: 11.9 fL (ref 7.5–12.5)
Platelets: 271 10*3/uL (ref 140–400)
RBC: 4.01 10*6/uL (ref 3.80–5.10)
RDW: 12.2 % (ref 11.0–15.0)
WBC: 7.9 10*3/uL (ref 3.8–10.8)

## 2022-02-13 LAB — LIPID PANEL
Cholesterol: 237 mg/dL — ABNORMAL HIGH (ref <200)
HDL: 63 mg/dL (ref >=50)
LDL Cholesterol (Calc): 154 mg/dL (calc) — ABNORMAL HIGH
Non-HDL Cholesterol (Calc): 174 mg/dL (calc) — ABNORMAL HIGH (ref <130)
Total CHOL/HDL Ratio: 3.8 (calc) (ref <5.0)
Triglycerides: 92 mg/dL (ref <150)

## 2022-02-13 LAB — TEST AUTHORIZATION: TEST CODE:: 7600

## 2022-02-13 LAB — ALPHA-GAL PANEL
Allergen, Mutton, f88: 1.24 kU/L — ABNORMAL HIGH
Allergen, Pork, f26: 0.97 kU/L — ABNORMAL HIGH
Beef: 1.89 kU/L — ABNORMAL HIGH
CLASS: 2
CLASS: 2
Class: 2
GALACTOSE-ALPHA-1,3-GALACTOSE IGE*: 12 kU/L — ABNORMAL HIGH (ref <0.10)

## 2022-02-13 LAB — HEMOGLOBIN A1C
Hgb A1c MFr Bld: 5.1 % of total Hgb (ref <5.7)
Mean Plasma Glucose: 100 mg/dL
eAG (mmol/L): 5.5 mmol/L

## 2022-02-13 LAB — INTERPRETATION:

## 2022-02-13 NOTE — Telephone Encounter (Signed)
Her neurologist recently went up on her medication. We discussed this at the office visit. Nothing else I can do on my end. If she has further concerns, she needs to reach out to neurology.

## 2022-02-14 NOTE — Telephone Encounter (Signed)
Tried calling; Pt's voicemail box is full.  PEC please advise pt if she calls back.   Thanks,   -Mickel Baas

## 2022-02-15 NOTE — Telephone Encounter (Signed)
Tried calling pt's voicemail is full.  PEC please advise if she calls back.   Thanks,   -Mickel Baas

## 2022-03-21 ENCOUNTER — Telehealth: Payer: Self-pay

## 2022-03-21 NOTE — Telephone Encounter (Signed)
   I attempted to contact the patient to schedule her Medicare Annual Wellness Visit, no answer. Mailbox full.  Donnie [place], [place] [phone]

## 2022-03-30 ENCOUNTER — Telehealth: Payer: Self-pay | Admitting: *Deleted

## 2022-03-30 NOTE — Telephone Encounter (Signed)
Pt's husband calling for results, specifically Alpha Gal results. Message from  PCP read, verbalizes understanding. Reviewed other labs as requested, no additional notes from PCP. Advised to CB if any additional questions arise.Verbalizes understanding.

## 2022-04-17 ENCOUNTER — Telehealth: Payer: Self-pay

## 2022-04-17 NOTE — Telephone Encounter (Signed)
I left a message for the patient to call back and schedule their Medicare Annual Wellness Visit (AWV) virtually, by telephone, or face-to-face.   Shadiamond Koska, CMA [phone]  

## 2022-04-19 NOTE — Progress Notes (Unsigned)
Patient: [name] [name] Date of Birth: 04-10-1951  Reason for Visit: Follow up for memory History from: Patient, husband Primary Neurologist: Rexene Alberts  ASSESSMENT AND PLAN 71 y.o. year old female   1.  Mild cognitive impairment 2.  Alcohol abuse 3.  Depression  -MMSE 25/30 was 22/11 January 2022 -Continue Namenda 10 mg twice daily, I do not think she has been taking consistently, her husband will help her oversee this; discussed Aricept, may consider in the future, right now she isn't eating much, worry this may worsen, have held in past due to low HR but is 67 today -Concern significant anxiety, depression, alcohol abuse; referral to psychiatry  -I have some concerns about her driving, due to her forgetting where she is going, the drinking, I would recommend she refrain from driving  -Not interested in smoking cessation -Needs close follow up with PCP for management of vascular risk factors -Will be due for neuropsych re-evaluation in October 2024 with Dr. Melvyn Novas -Return here in 6 months or sooner if needed   HISTORY OF PRESENT ILLNESS: Today 04/20/22 Here today with her husband. She is upset today her dog is in the vet with seizures. Still trouble with memory, poor short term. Asks the same questions. Claims taking Namenda but her pill bottle is 1/2 full, and was filled in August. She sleeps a lot, she admits she is depressed. She has had appointments with psychiatry but cancelled. She has really cut back on wine drinking, still drinks 2-3 beers at night. Concern she is drinking tequilla? She doesn't eat with her alcohol. MMSE 25/30.  When driving, she often forgets where she is going, has 2 dents in her car, doesn't know how they got there.   Update 12/28/21 SS: [name] [name] is here today for follow-up.  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  Etiology difficult to determine, with underlying alcohol use, severe anxiety and moderate depression, sleep dysfunction.   These factors likely contributing.  Could not rule out early stages of neurodegenerative illness, i.e. Alzheimer's disease.  Recommended repeat neuropsych evaluation in 18 months.  Today, MMSE 22/30. Lives alone, going through divorce, they switch off between RV and home. Stress with this. Her kids lives across the country. She had new puppy, has been bright spot. Memory continues to decline, has to verbally give herself reminders. Pays her own bills, keeps up with medications, no issues. Mood is a struggle, is on Buspar, Remeron. Sleeping better with Remeron, only 1/2 tablet. Has done well with Namenda 5 mg twice daily, no side effects. Still smoking. 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 BP 160/90 manual.   HISTORY Copied Dr. Rexene Alberts: [name] [name] is a 71 year old right-handed woman with an underlying medical history of hypertension, allergies, anxiety, depression, and excessive alcohol consumption, who presents for follow-up consultation of her memory loss.  The patient is accompanied by a friend, Letta Median (her best friend of over 46 years) today. I first met her at the request of her primary care nurse practitioner on 10/25/2020, at which time she reported a several year history of forgetfulness and getting lost while driving.  Her MMSE was 26 out of 30 at the time.  I suggested we proceed with additional work-up in the form of neuropsychological evaluation and brain MRI.  She had recent labs done through her primary care including vitamin B12 which was elevated at the time, TSH was 2.71 in March 2022.     She had a brain MRI  with and without contrast on 11/07/2020 and I reviewed the results: IMPRESSION: This MRI of the brain with and without contrast shows the following: 1.  Mild generalized cortical atrophy, mildly progressed compared to the 03/22/2019 MRI. 2.  Some scattered punctate T2/FLAIR hyperintense foci in the hemispheres consistent with minimal chronic microvascular ischemic change, stable  compared to the 2020 MRI. 3.  No acute findings. 4.  Normal enhancement pattern.   A referral was made to the [facility] for neuropsychological evaluation, October 2022 but did not make the appointment.  She saw Dr. Hazle Coca in April 2023.   I reviewed his impression and recommendations as outlined in his visit note from 08/24/2021: <<Clinical Impression(s): Scores across stand-alone and embedded performance validity measures were variable but largely within expectation. Given this variability, a mild degree of caution may be prudent when interpreting the results of cognitive testing.   If taken at face value, [name] [name]'s pattern of performance is suggestive of impairment across all aspects of verbal memory. Additional impairment was exhibited semantic fluency, while variable but overall below expectation performances were noted across executive functioning and visual memory. Performances were appropriate relative to age-matched peers across processing speed, attention/concentration, safety/judgment, receptive language, phonemic fluency, confrontation naming, and visuospatial abilities. [name] [name] largely denied difficulties completing instrumental activities of daily living (ADLs) independently. 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   The etiology of ongoing dysfunction is difficult to determine and likely mixed to some extent. Despite reporting significant alcohol reduction, she reported including a "splash" of Kahlua (estimated to be 0.5-1 shot in volume) within her morning coffee. Per her and her friend's estimations, she consumes 1-3 cups of coffee daily, which would suggest anywhere from 3.5 to 21 alcoholic beverages per week. As the CDC defines "heavy drinking" in women as 8 or more alcoholic beverages per week, [name] [name] is certainly at risk for exceeding  this amount regularly. Persistent alcohol abuse will certainly worsen cognitive abilities and place her at an increased risk for the development of an alcohol-related dementia presentation in the future.    Outside of alcohol consumption, Ms. Sayler reported acute symptoms of severe anxiety and moderate depression. She also reported severe sleep dysfunction on a related questionnaire. Ongoing severe psychiatric distress can also create and worsen cognitive dysfunction, particularly affecting domains of processing speed, attention/concentration, executive functioning, and learning and memory. Chronic sleep deprivation can have a similar impact, as well as prominent psychosocial stress.    At the present time, it is unclear how much of her current deficits are related to the factors described above. Despite this, I certainly cannot rule out the early stages of a neurodegenerative illness, namely Alzheimer's disease. Ms. Kipnis did not benefit from repeated exposure to new information, was fully amnestic across 2/4 memory tasks, and exhibited retention rates ranging from 0% to 50%. This could suggest rapid forgetting and an emerging memory storage deficit, which are hallmark characteristics of this illness. Deficits in semantic fluency would also represent fairly typical disease progression. Overall, continued monitoring will be important moving forward.    She does not exhibit behavioral characteristics concerning for Lewy body dementia, frontotemporal dementia, or a more rare parkinsonian syndrome. Imaging did not reveal age-advanced cerebrovascular changes worrisome for a vascular dementia presentation.   Recommendations: A repeat neuropsychological evaluation in 18 months (or sooner if functional decline is noted) is recommended to assess the trajectory  of future cognitive decline should it occur. This would ideally be accomplished once mood-related concerns are better managed and she has maintained  sobriety from alcohol.   Around the time of her previous meeting with Dr. Rexene Alberts, Ms. Shehata reported complete alcohol cessation. I would strongly encourage her to resume this behavior and eliminate Kahlua from her morning coffee. As she reported using this as a way of sweetening this beverage, I would encourage her to trial other non-alcoholic sweetening alternatives.    If desired, Ms. Furgeson could discuss memory-based medication options with Dr. Rexene Alberts given ongoing deficits. It is important to highlight that this medication has been shown to slow functional decline in some individuals. There is no current treatment which can stop or reverse cognitive decline when caused by a neurodegenerative illness.    A combination of medication and psychotherapy has been shown to be most effective at treating symptoms of anxiety and depression. As such, Ms. Yadav is encouraged to speak with her prescribing physician regarding medication adjustments to optimally manage these symptoms. She should also discuss ways to improve her sleep with this physician.    I would strongly encourage her to engage in individual psychotherapy to address severe psychiatric distress. Marital counseling in addition to individual counseling would also be beneficial given marital discord representing a significant life stressor; however, this would require buy-in from her husband. She would benefit from an active and collaborative therapeutic environment, rather than one purely supportive in nature. Recommended treatment modalities include Cognitive Behavioral Therapy (CBT) or Acceptance and Commitment Therapy (ACT).   Ms. Bebeau is encouraged to attend to lifestyle factors for brain health (e.g., regular physical exercise, good nutrition habits, regular participation in cognitively-stimulating activities, and general stress management techniques), which are likely to have benefits for both emotional adjustment and cognition. In fact, in  addition to promoting good general health, regular exercise incorporating aerobic activities (e.g., brisk walking, jogging, cycling, etc.) has been demonstrated to be a very effective treatment for depression and stress, with similar efficacy rates to both antidepressant medication and psychotherapy. Optimal control of vascular risk factors (including safe cardiovascular exercise and adherence to dietary recommendations) is encouraged. Continued participation in activities which provide mental stimulation and social interaction is also recommended.    When learning new information, she would benefit from information being broken up into small, manageable pieces. She may also find it helpful to articulate the material in her own words and in a context to promote encoding at the onset of a new task. This material may need to be repeated multiple times to promote encoding.   Memory can be improved using internal strategies such as rehearsal, repetition, chunking, mnemonics, association, and imagery. External strategies such as written notes in a consistently used memory journal, visual and nonverbal auditory cues such as a calendar on the refrigerator or appointments with alarm, such as on a cell phone, can also help maximize recall.     To address problems with fluctuating attention, she may wish to consider:   -Avoiding external distractions when needing to concentrate   -Limiting exposure to fast paced environments with multiple sensory demands   -Writing down complicated information and using checklists   -Attempting and completing one task at a time (i.e., no multi-tasking)   -Verbalizing aloud each step of a task to maintain focus   -Reducing the amount of information considered at one time>>     Today, 09/21/2021:  She reports that her memory is worse.  She feels frustrated  as she has been more forgetful.  She talks out loud to remind herself what to do and how to multitask.  She has ongoing  stress.  She is on sertraline and BuSpar per primary care but has not had a recent checkup.  She has not talked about stress management with her primary care, she has not pursued therapy.  She reports ongoing marital stress.  She has not been eating well.  Sometimes she forgets to eat.  Sometimes she eats very little.  She drinks very little water but likes to drink green tea which she makes herself.  She drinks no alcohol daily, she quit drinking alcohol altogether about 2 months ago.  She has had significant weight loss in the past 3 months.  She has not been able to quit smoking.  Her friend reports that she is worried about her stress and weight loss and that she still worries about her smoking.  Her friend also reports that patient was adding alcohol to three-quarter to her coffee.  Patient reports that she was adding this for sweetener but that she has stopped it in the past month or longer.     Previously:    10/25/20: (She) reports forgetfulness as well as getting lost while driving.  Symptoms have worsened over the past 4 to 5 years.  She reports that her father had memory issues in his later years, he lived to be around 20.  Her mom lived to be 34 and did not have any memory issues but patient reports that mom had a problem with drinking alcohol.  She has been under stress, she reports marital issues.  She is not separated, husband lives with her, they have been married for over 26 years but she reports that he has not been faithful.  She has not been in marital counseling.  She has had some group therapy she reports.  She has 2 grown children, daughter lives in [place] state and son lives in [place].  She has a brother in [place] and a sister in [place], neither with memory issues.  She does not sleep well currently, she does not take any medication to help her sleep.  She quit drinking wine/alcohol some 3 to 4 weeks ago completely.  She reports residual depression and anxiety.  She is currently  on BuSpar and sertraline, she denies any suicidal ideations. I reviewed your office note from 07/29/2020.  She endorsed anxiety and depression and excessive alcohol consumption at the time.  She was referred to psychology.  She had blood work through your office on 07/29/2020 and I reviewed the results: CBC showed RBC of 3.72, hemoglobin normal at 12.3, hematocrit normal at 36.1.  CMP showed benign findings, sodium 138, potassium 4.6, glucose 94, BUN 11, creatinine 0.72.  TSH was normal at 2.71.  Vitamin D was normal at 49.98.  Vitamin B12 was 1506.  She was advised to reduce her vitamin B-12 supplementation.  She has since then also been referred to psychiatry.   She had a brain MRI without contrast on 03/23/2019 through [facility].  Indication was memory loss for 1 year.  I reviewed the test results: IMPRESSION: 1. Mild generalized parenchymal atrophy. 2. Otherwise unremarkable MRI appearance of the brain for age.   She reports loss of appetite.  She has lost weight but recently gained about 3 pounds back.  She does not drink a whole lot of water, likes to drink unsweetened green tea which she believes herself.  She  drinks coffee in the morning, about 2 cups/day.  She smokes cigarettes, she has recently increased her smoking secondary to stress.  She smokes (corrected on 09/21/2021) more than 1 pack/day currently.  She retired as a Pharmacist, hospital.  She lives with her husband.  REVIEW OF SYSTEMS: Out of a complete 14 system review of symptoms, the patient complains only of the following symptoms, and all other reviewed systems are negative.  See HPI  ALLERGIES: Allergies  Allergen Reactions   Erythromycin Other (See Comments)    REACTION: GI upset   Penicillins Nausea And Vomiting    HOME MEDICATIONS: Outpatient Medications Prior to Visit  Medication Sig Dispense Refill   aspirin 81 MG EC tablet Take 1 tablet (81 mg total) by mouth daily. Swallow whole. 30 tablet 12   busPIRone  (BUSPAR) 5 MG tablet Take 1 tablet (5 mg total) by mouth 3 (three) times daily. 270 tablet 1   Cholecalciferol (VITAMIN D3) 50 MCG (2000 UT) TABS Take 1 tablet by mouth daily.     Cyanocobalamin (B-12) 5000 MCG CAPS Take 1 capsule by mouth daily.     hydrochlorothiazide (HYDRODIURIL) 25 MG tablet Take 1 tablet (25 mg total) by mouth daily. 90 tablet 1   losartan (COZAAR) 100 MG tablet Take 1 tablet (100 mg total) by mouth daily. 90 tablet 1   memantine (NAMENDA) 10 MG tablet Take 1 tablet (10 mg total) by mouth 2 (two) times daily. 180 tablet 1   mirtazapine (REMERON) 7.5 MG tablet Take 1 tablet (7.5 mg total) by mouth at bedtime. 30 tablet 2   Probiotic Product (TRUNATURE DIGESTIVE PROBIOTIC PO) Take 1 tablet by mouth daily at 2 PM.     rosuvastatin (CRESTOR) 20 MG tablet Take 1 tablet (20 mg total) by mouth daily. 90 tablet 1   traMADol (ULTRAM) 50 MG tablet TAKE 1 TABLET BY MOUTH EVERY 8 HOURS AS NEEDED FOR PAIN (Patient taking differently: as needed.) 30 tablet 0   No facility-administered medications prior to visit.    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, sacral and sacrococcygeal region 10/16/2017    PAST SURGICAL HISTORY: Past Surgical History:  Procedure Laterality Date   ABLATION     uterine   ANAL FISSURE REPAIR     age 19 and college   COLONOSCOPY     DILATION AND CURETTAGE OF UTERUS     HEMORRHOID BANDING  06/02/2013   TONSILLECTOMY     age 34    FAMILY HISTORY: Family History  Problem Relation Age of Onset   Diabetes Mother    Stroke Mother    Other Mother        colon sugery   Polymyalgia rheumatica Mother     Memory loss Mother        late 32s   Alcoholism Mother    Memory loss Father    Heart disease Father    Diabetes Father    Dementia Father        unspecified type; late 70s/early 80s   Colon cancer Neg Hx    Cancer Neg Hx    Esophageal cancer Neg Hx    Pancreatic  cancer Neg Hx    Stomach cancer Neg Hx     SOCIAL HISTORY: Social History   Socioeconomic History   Marital status: Married    Spouse name: Not on file   Number of children: 2   Years of education: 16   Highest education level: Bachelor's degree (e.g., BA, AB, BS)  Occupational History   Occupation: Retired    Comment: Education officer, museum  Tobacco Use   Smoking status: Every Day    Packs/day: 1.00    Types: Cigarettes   Smokeless tobacco: Never  Vaping Use   Vaping Use: Former  Substance and Sexual Activity   Alcohol use: Not Currently    Comment: occ   Drug use: No   Sexual activity: Not Currently    Birth control/protection: Post-menopausal  Other Topics Concern   Not on file  Social History Narrative   Not on file   Social Determinants of Health   Financial Resource Strain: Not on file  Food Insecurity: Not on file  Transportation Needs: Not on file  Physical Activity: Not on file  Stress: Not on file  Social Connections: Not on file  Intimate Partner Violence: Not on file   PHYSICAL EXAM  Vitals:   04/20/22 1051  BP: (!) 168/80  Pulse: 67  Weight: 102 lb (46.3 kg)  Height: _0  (1.499 m)    Body mass index is 20.6 kg/m.    04/20/2022   11:00 AM 12/28/2021   11:21 AM 09/21/2021    9:47 AM  MMSE - Mini Mental State Exam  Orientation to time _1 Orientation to Place _2 Registration _3 Attention/ Calculation 5 0 1  Recall _4 Language- name 2 objects _5 Language- repeat _6 Language- follow 3 step command _7 Language- read & follow direction _8 Write a sentence _9 Copy design _10 Total score _11 Generalized: Well developed, in no  acute distress  Neurological examination  Mentation: Alert oriented to time, place, history taking. Follows all commands speech and language fluent Cranial nerve II-XII: Pupils were equal round reactive to light. Extraocular movements were full, visual field were full on confrontational test. Facial sensation and strength were normal. Head turning and shoulder shrug  were normal and symmetric. Motor: The motor testing reveals 5 over 5 strength of all 4 extremities. Good symmetric motor tone is noted throughout.  Sensory: Sensory testing is intact to soft touch on all 4 extremities. No evidence of extinction is noted.  Coordination: Cerebellar testing reveals good finger-nose-finger and heel-to-shin bilaterally.  Gait and station: Gait is normal. Tandem gait is normal.  Romberg is negative. Reflexes: Deep tendon reflexes are symmetric and normal bilaterally.   DIAGNOSTIC DATA (LABS, IMAGING, TESTING) - I reviewed patient records, labs, notes, testing and imaging myself where available.  Lab Results  Component Value Date   WBC 7.9 02/10/2022   HGB 13.3 02/10/2022   HCT 39.2 02/10/2022   MCV 97.8 02/10/2022   PLT 271 02/10/2022      Component Value Date/Time   NA 139 02/10/2022 1520   K 3.6 02/10/2022 1520   CL 105 02/10/2022 1520   CO2 26 02/10/2022 1520   GLUCOSE 85 02/10/2022 1520   BUN 11 02/10/2022 1520   CREATININE 0.75 02/10/2022 1520   CALCIUM 9.7 02/10/2022 1520   PROT 7.4  02/10/2022 1520   ALBUMIN 4.1 07/29/2020 1601   AST 18 02/10/2022 1520   ALT 8 02/10/2022 1520   ALKPHOS 53 07/29/2020 1601   BILITOT 0.8 02/10/2022 1520   GFRNONAA >60 01/01/2020 0405   GFRAA >60 01/01/2020 0405   Lab Results  Component Value Date   CHOL 237 (H) 02/10/2022   HDL 63 02/10/2022   LDLCALC 154 (H) 02/10/2022   TRIG 92 02/10/2022   CHOLHDL 3.8 02/10/2022   Lab Results  Component Value Date   HGBA1C 5.1 02/10/2022   Lab Results  Component Value Date   VITAMINB12 >1506 (H)  07/29/2020   Lab Results  Component Value Date   TSH 2.65 05/13/2021    Butler Denmark, AGNP-C, DNP 04/20/2022, 11:20 AM [facility] [address] [place], [place] [address] [phone]

## 2022-04-20 ENCOUNTER — Encounter: Payer: Self-pay | Admitting: Neurology

## 2022-04-20 ENCOUNTER — Ambulatory Visit: Payer: Medicare HMO | Admitting: Neurology

## 2022-04-20 VITALS — BP 168/80 | HR 67 | Ht 59.0 in | Wt 102.0 lb

## 2022-04-20 DIAGNOSIS — F419 Anxiety disorder, unspecified: Secondary | ICD-10-CM

## 2022-04-20 DIAGNOSIS — R4189 Other symptoms and signs involving cognitive functions and awareness: Secondary | ICD-10-CM

## 2022-04-20 DIAGNOSIS — G3184 Mild cognitive impairment, so stated: Secondary | ICD-10-CM | POA: Diagnosis not present

## 2022-04-20 DIAGNOSIS — F411 Generalized anxiety disorder: Secondary | ICD-10-CM

## 2022-04-20 DIAGNOSIS — F32A Depression, unspecified: Secondary | ICD-10-CM | POA: Diagnosis not present

## 2022-04-20 MED ORDER — MEMANTINE HCL 10 MG PO TABS: 10.0000 mg | ORAL_TABLET | Freq: Two times a day (BID) | ORAL | 3 refills | Status: DC

## 2022-04-20 NOTE — Patient Instructions (Addendum)
I would not recommend driving at this point I will will refer you to psychiatry  Please continue the Namenda, make sure you are taking daily   Meds ordered this encounter  Medications   memantine (NAMENDA) 10 MG tablet    Sig: Take 1 tablet (10 mg total) by mouth 2 (two) times daily.    Dispense:  180 tablet    Refill:  3

## 2022-04-21 ENCOUNTER — Telehealth: Payer: Self-pay | Admitting: Neurology

## 2022-04-21 NOTE — Telephone Encounter (Signed)
Referral sent to [name], phone # [phone].

## 2022-04-26 DIAGNOSIS — F17200 Nicotine dependence, unspecified, uncomplicated: Secondary | ICD-10-CM | POA: Diagnosis not present

## 2022-04-26 DIAGNOSIS — F1020 Alcohol dependence, uncomplicated: Secondary | ICD-10-CM | POA: Diagnosis not present

## 2022-04-26 DIAGNOSIS — F32A Depression, unspecified: Secondary | ICD-10-CM | POA: Diagnosis not present

## 2022-04-26 DIAGNOSIS — F419 Anxiety disorder, unspecified: Secondary | ICD-10-CM | POA: Diagnosis not present

## 2022-04-30 DIAGNOSIS — F419 Anxiety disorder, unspecified: Secondary | ICD-10-CM | POA: Diagnosis not present

## 2022-04-30 DIAGNOSIS — F32A Depression, unspecified: Secondary | ICD-10-CM | POA: Diagnosis not present

## 2022-04-30 DIAGNOSIS — F17200 Nicotine dependence, unspecified, uncomplicated: Secondary | ICD-10-CM | POA: Diagnosis not present

## 2022-04-30 DIAGNOSIS — F1020 Alcohol dependence, uncomplicated: Secondary | ICD-10-CM | POA: Diagnosis not present

## 2022-05-01 DIAGNOSIS — F32A Depression, unspecified: Secondary | ICD-10-CM | POA: Diagnosis not present

## 2022-05-01 DIAGNOSIS — F419 Anxiety disorder, unspecified: Secondary | ICD-10-CM | POA: Diagnosis not present

## 2022-05-01 DIAGNOSIS — F1020 Alcohol dependence, uncomplicated: Secondary | ICD-10-CM | POA: Diagnosis not present

## 2022-05-01 DIAGNOSIS — F17200 Nicotine dependence, unspecified, uncomplicated: Secondary | ICD-10-CM | POA: Diagnosis not present

## 2022-05-01 NOTE — Telephone Encounter (Signed)
[facility] left me a voice mail and said they do not offer what the patient needs on the referral. They said it will need to be sent somewhere else. They said they tried to reach the patient to let her know but were not able to get in touch with her.

## 2022-05-01 NOTE — Telephone Encounter (Signed)
Resent to [facility], phone # [phone].

## 2022-05-02 DIAGNOSIS — F419 Anxiety disorder, unspecified: Secondary | ICD-10-CM | POA: Diagnosis not present

## 2022-05-02 DIAGNOSIS — F1020 Alcohol dependence, uncomplicated: Secondary | ICD-10-CM | POA: Diagnosis not present

## 2022-05-02 DIAGNOSIS — F17200 Nicotine dependence, unspecified, uncomplicated: Secondary | ICD-10-CM | POA: Diagnosis not present

## 2022-05-02 DIAGNOSIS — F32A Depression, unspecified: Secondary | ICD-10-CM | POA: Diagnosis not present

## 2022-05-03 DIAGNOSIS — F419 Anxiety disorder, unspecified: Secondary | ICD-10-CM | POA: Diagnosis not present

## 2022-05-03 DIAGNOSIS — F17200 Nicotine dependence, unspecified, uncomplicated: Secondary | ICD-10-CM | POA: Diagnosis not present

## 2022-05-03 DIAGNOSIS — F1020 Alcohol dependence, uncomplicated: Secondary | ICD-10-CM | POA: Diagnosis not present

## 2022-05-03 DIAGNOSIS — F32A Depression, unspecified: Secondary | ICD-10-CM | POA: Diagnosis not present

## 2022-05-04 DIAGNOSIS — F1020 Alcohol dependence, uncomplicated: Secondary | ICD-10-CM | POA: Diagnosis not present

## 2022-05-04 DIAGNOSIS — F419 Anxiety disorder, unspecified: Secondary | ICD-10-CM | POA: Diagnosis not present

## 2022-05-04 DIAGNOSIS — F32A Depression, unspecified: Secondary | ICD-10-CM | POA: Diagnosis not present

## 2022-05-04 DIAGNOSIS — F17200 Nicotine dependence, unspecified, uncomplicated: Secondary | ICD-10-CM | POA: Diagnosis not present

## 2022-05-05 DIAGNOSIS — F17200 Nicotine dependence, unspecified, uncomplicated: Secondary | ICD-10-CM | POA: Diagnosis not present

## 2022-05-05 DIAGNOSIS — F419 Anxiety disorder, unspecified: Secondary | ICD-10-CM | POA: Diagnosis not present

## 2022-05-05 DIAGNOSIS — F1020 Alcohol dependence, uncomplicated: Secondary | ICD-10-CM | POA: Diagnosis not present

## 2022-05-05 DIAGNOSIS — F32A Depression, unspecified: Secondary | ICD-10-CM | POA: Diagnosis not present

## 2022-05-06 DIAGNOSIS — F419 Anxiety disorder, unspecified: Secondary | ICD-10-CM | POA: Diagnosis not present

## 2022-05-06 DIAGNOSIS — F1020 Alcohol dependence, uncomplicated: Secondary | ICD-10-CM | POA: Diagnosis not present

## 2022-05-06 DIAGNOSIS — F17200 Nicotine dependence, unspecified, uncomplicated: Secondary | ICD-10-CM | POA: Diagnosis not present

## 2022-05-06 DIAGNOSIS — F32A Depression, unspecified: Secondary | ICD-10-CM | POA: Diagnosis not present

## 2022-05-07 DIAGNOSIS — F17200 Nicotine dependence, unspecified, uncomplicated: Secondary | ICD-10-CM | POA: Diagnosis not present

## 2022-05-07 DIAGNOSIS — F32A Depression, unspecified: Secondary | ICD-10-CM | POA: Diagnosis not present

## 2022-05-07 DIAGNOSIS — F1020 Alcohol dependence, uncomplicated: Secondary | ICD-10-CM | POA: Diagnosis not present

## 2022-05-07 DIAGNOSIS — F419 Anxiety disorder, unspecified: Secondary | ICD-10-CM | POA: Diagnosis not present

## 2022-05-08 DIAGNOSIS — F32A Depression, unspecified: Secondary | ICD-10-CM | POA: Diagnosis not present

## 2022-05-08 DIAGNOSIS — F419 Anxiety disorder, unspecified: Secondary | ICD-10-CM | POA: Diagnosis not present

## 2022-05-08 DIAGNOSIS — F17200 Nicotine dependence, unspecified, uncomplicated: Secondary | ICD-10-CM | POA: Diagnosis not present

## 2022-05-08 DIAGNOSIS — F1020 Alcohol dependence, uncomplicated: Secondary | ICD-10-CM | POA: Diagnosis not present

## 2022-05-09 DIAGNOSIS — F419 Anxiety disorder, unspecified: Secondary | ICD-10-CM | POA: Diagnosis not present

## 2022-05-09 DIAGNOSIS — F17200 Nicotine dependence, unspecified, uncomplicated: Secondary | ICD-10-CM | POA: Diagnosis not present

## 2022-05-09 DIAGNOSIS — F1020 Alcohol dependence, uncomplicated: Secondary | ICD-10-CM | POA: Diagnosis not present

## 2022-05-09 DIAGNOSIS — F32A Depression, unspecified: Secondary | ICD-10-CM | POA: Diagnosis not present

## 2022-05-10 DIAGNOSIS — F17200 Nicotine dependence, unspecified, uncomplicated: Secondary | ICD-10-CM | POA: Diagnosis not present

## 2022-05-10 DIAGNOSIS — F32A Depression, unspecified: Secondary | ICD-10-CM | POA: Diagnosis not present

## 2022-05-10 DIAGNOSIS — F1020 Alcohol dependence, uncomplicated: Secondary | ICD-10-CM | POA: Diagnosis not present

## 2022-05-10 DIAGNOSIS — F419 Anxiety disorder, unspecified: Secondary | ICD-10-CM | POA: Diagnosis not present

## 2022-05-11 DIAGNOSIS — F1020 Alcohol dependence, uncomplicated: Secondary | ICD-10-CM | POA: Diagnosis not present

## 2022-05-11 DIAGNOSIS — F419 Anxiety disorder, unspecified: Secondary | ICD-10-CM | POA: Diagnosis not present

## 2022-05-11 DIAGNOSIS — F17200 Nicotine dependence, unspecified, uncomplicated: Secondary | ICD-10-CM | POA: Diagnosis not present

## 2022-05-11 DIAGNOSIS — F32A Depression, unspecified: Secondary | ICD-10-CM | POA: Diagnosis not present

## 2022-05-12 DIAGNOSIS — F17200 Nicotine dependence, unspecified, uncomplicated: Secondary | ICD-10-CM | POA: Diagnosis not present

## 2022-05-12 DIAGNOSIS — F419 Anxiety disorder, unspecified: Secondary | ICD-10-CM | POA: Diagnosis not present

## 2022-05-12 DIAGNOSIS — F1020 Alcohol dependence, uncomplicated: Secondary | ICD-10-CM | POA: Diagnosis not present

## 2022-05-12 DIAGNOSIS — F32A Depression, unspecified: Secondary | ICD-10-CM | POA: Diagnosis not present

## 2022-05-13 DIAGNOSIS — F1020 Alcohol dependence, uncomplicated: Secondary | ICD-10-CM | POA: Diagnosis not present

## 2022-05-13 DIAGNOSIS — F32A Depression, unspecified: Secondary | ICD-10-CM | POA: Diagnosis not present

## 2022-05-13 DIAGNOSIS — F17200 Nicotine dependence, unspecified, uncomplicated: Secondary | ICD-10-CM | POA: Diagnosis not present

## 2022-05-13 DIAGNOSIS — F419 Anxiety disorder, unspecified: Secondary | ICD-10-CM | POA: Diagnosis not present

## 2022-05-14 DIAGNOSIS — F17200 Nicotine dependence, unspecified, uncomplicated: Secondary | ICD-10-CM | POA: Diagnosis not present

## 2022-05-14 DIAGNOSIS — F1020 Alcohol dependence, uncomplicated: Secondary | ICD-10-CM | POA: Diagnosis not present

## 2022-05-14 DIAGNOSIS — F32A Depression, unspecified: Secondary | ICD-10-CM | POA: Diagnosis not present

## 2022-05-14 DIAGNOSIS — F419 Anxiety disorder, unspecified: Secondary | ICD-10-CM | POA: Diagnosis not present

## 2022-05-15 DIAGNOSIS — F1020 Alcohol dependence, uncomplicated: Secondary | ICD-10-CM | POA: Diagnosis not present

## 2022-05-15 DIAGNOSIS — F419 Anxiety disorder, unspecified: Secondary | ICD-10-CM | POA: Diagnosis not present

## 2022-05-15 DIAGNOSIS — F17200 Nicotine dependence, unspecified, uncomplicated: Secondary | ICD-10-CM | POA: Diagnosis not present

## 2022-05-15 DIAGNOSIS — F32A Depression, unspecified: Secondary | ICD-10-CM | POA: Diagnosis not present

## 2022-05-16 DIAGNOSIS — F17200 Nicotine dependence, unspecified, uncomplicated: Secondary | ICD-10-CM | POA: Diagnosis not present

## 2022-05-16 DIAGNOSIS — F1020 Alcohol dependence, uncomplicated: Secondary | ICD-10-CM | POA: Diagnosis not present

## 2022-05-16 DIAGNOSIS — F32A Depression, unspecified: Secondary | ICD-10-CM | POA: Diagnosis not present

## 2022-05-16 DIAGNOSIS — F419 Anxiety disorder, unspecified: Secondary | ICD-10-CM | POA: Diagnosis not present

## 2022-05-22 ENCOUNTER — Encounter: Payer: Self-pay | Admitting: Internal Medicine

## 2022-05-22 ENCOUNTER — Ambulatory Visit (INDEPENDENT_AMBULATORY_CARE_PROVIDER_SITE_OTHER): Payer: Medicare PPO | Admitting: Internal Medicine

## 2022-05-22 VITALS — BP 136/74 | HR 76 | Temp 96.9°F | Wt 101.0 lb

## 2022-05-22 DIAGNOSIS — R7989 Other specified abnormal findings of blood chemistry: Secondary | ICD-10-CM | POA: Diagnosis not present

## 2022-05-22 MED ORDER — TRAMADOL HCL 50 MG PO TABS: 50.0000 mg | ORAL_TABLET | Freq: Three times a day (TID) | ORAL | 0 refills | Status: DC | PRN

## 2022-05-22 NOTE — Addendum Note (Signed)
Addended by: Jearld Fenton on: 05/22/2022 02:46 PM   Modules accepted: Orders

## 2022-05-22 NOTE — Progress Notes (Signed)
Subjective:    Patient ID: [name] [name], female    DOB: February 23, 1951, 72 y.o.   MRN: [id number]  HPI  Patient presents to clinic today with complaint of abnormal TSH. She was in rehab 04/1322-05/17/22. During the lab workup, her TSH was 12.22 and Free T4 was low. She reports dry skin, feels cold, and depression. She denies constipation. She has a family history of thyroid problems.  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 Swallow whole. 30 tablet 12   busPIRone (BUSPAR) 5 MG tablet Take 1 tablet (5 mg total) by mouth 3 (three) times daily. 270 tablet 1   Cholecalciferol (VITAMIN D3) 50 MCG (2000 UT) TABS Take 1 tablet by mouth daily.     Cyanocobalamin (B-12) 5000 MCG CAPS Take 1 capsule by mouth daily.     hydrochlorothiazide (HYDRODIURIL) 25 MG tablet Take 1 tablet (25 mg total) by mouth daily. 90 tablet 1   losartan (COZAAR) 100 MG tablet Take 1 tablet (100 mg total) by mouth daily. 90 tablet 1   memantine (NAMENDA) 10 MG tablet Take 1 tablet (10 mg total) by mouth 2 (two) times daily. 180 tablet 3   mirtazapine (REMERON) 7.5 MG tablet Take 1 tablet (7.5 mg total) by mouth at bedtime. 30 tablet 2   Probiotic Product (TRUNATURE DIGESTIVE PROBIOTIC PO) Take 1 tablet by mouth daily at 2 PM.     rosuvastatin (CRESTOR) 20 MG tablet Take 1 tablet (20 mg total) by mouth daily. 90 tablet 1   traMADol (ULTRAM) 50 MG tablet TAKE 1 TABLET BY MOUTH EVERY 8 HOURS AS  NEEDED FOR PAIN (Patient taking differently: as needed.) 30 tablet 0   No current facility-administered medications for this visit.    Allergies  Allergen Reactions   Erythromycin Other (See Comments)    REACTION: GI upset   Penicillins Nausea And Vomiting    Family History  Problem Relation Age of Onset   Diabetes Mother    Stroke Mother    Other Mother        colon sugery   Polymyalgia rheumatica Mother    Memory loss Mother        late 78s   Alcoholism Mother    Memory loss Father    Heart disease Father    Diabetes Father    Dementia Father        unspecified type; late 70s/early 50s   Colon cancer Neg Hx    Cancer Neg Hx    Esophageal cancer Neg Hx    Pancreatic cancer Neg Hx    Stomach cancer Neg Hx     Social History   Socioeconomic History   Marital status: Married    Spouse name: Not on file   Number of children: 2   Years of education: 16   Highest education level: Bachelor's degree (e.g., BA, AB, BS)  Occupational History   Occupation: Retired    Comment: Education officer, museum  Tobacco Use   Smoking status: Every Day    Packs/day: 1.00    Types: Cigarettes   Smokeless tobacco: Never  Vaping Use   Vaping Use: Former  Substance and Sexual Activity   Alcohol use: Not Currently    Comment: occ   Drug use: No   Sexual activity: Not Currently    Birth control/protection: Post-menopausal  Other Topics Concern   Not on file  Social History Narrative   Not on file   Social Determinants of Health   Financial Resource Strain: Not on file  Food Insecurity: Not on file  Transportation Needs: Not on file  Physical Activity: Not on file  Stress: Not on file  Social Connections: Not on file  Intimate Partner Violence: Not on file     Constitutional: Denies fever, malaise, fatigue, headache or abrupt weight changes.  HEENT: Denies eye pain, eye redness, ear pain, ringing in the ears, wax buildup, runny nose, nasal congestion, bloody nose, or sore  throat. Respiratory: Denies difficulty breathing, shortness of breath, cough or sputum production.   Cardiovascular: Denies chest pain, chest tightness, palpitations or swelling in the hands or feet.  Gastrointestinal: Denies abdominal pain, bloating, constipation, diarrhea or blood in the stool.  GU: Denies urgency, frequency, pain with urination, burning sensation, blood in urine, odor or discharge. Musculoskeletal: Denies decrease in range of motion, difficulty with gait, muscle pain or joint pain and swelling.  Skin: Pt reports dry detox, cold sensation of skin. Denies redness, rashes, lesions or ulcercations.  Neurological: Denies dizziness, difficulty with memory, difficulty with speech or problems with balance and coordination.  Psych: Pt has a history of depression. Denies anxiety, SI/HI.  No other specific complaints in a complete review of systems (except as listed in HPI above).  Objective:   Physical Exam  BP 136/74 (BP Location: Right Arm, Patient Position: Sitting, Cuff Size: Normal)   Pulse 76   Temp (!) 96.9 F (36.1 C) (Temporal)   Wt 101 lb (45.8 kg)   SpO2 99%   BMI 20.40 kg/m   Wt Readings from Last 3 Encounters:  04/20/22 102 lb (46.3 kg)  02/10/22 105 lb (47.6 kg)  12/28/21 102 lb (46.3 kg)    General: Appears his stated age, obese, in NAD. Skin: Warm, dry and intact. No rashes, lesions or ulcerations noted. HEENT: Head: normal shape and size; Eyes: sclera white, no icterus, conjunctiva pink, PERRLA and EOMs intact;  Neck:  Neck supple, trachea midline. No masses, lumps or thyromegaly present.  Cardiovascular: Normal rate and rhythm. S1,S2 noted.  No murmur, rubs or gallops noted.  Pulmonary/Chest: Normal effort and positive vesicular breath sounds. No respiratory distress. No wheezes, rales or ronchi noted.  Musculoskeletal: No difficulty with gait.  Neurological: Alert and oriented.   BMET    Component Value Date/Time   NA 139 02/10/2022 1520   K 3.6  02/10/2022 1520   CL 105 02/10/2022 1520   CO2 26 02/10/2022 1520   GLUCOSE 85 02/10/2022 1520   BUN 11 02/10/2022 1520   CREATININE 0.75 02/10/2022 1520   CALCIUM 9.7 02/10/2022 1520   GFRNONAA >60 01/01/2020 0405   GFRAA >60 01/01/2020 0405    Lipid Panel     Component Value Date/Time   CHOL 237 (H) 02/10/2022 1520   TRIG 92 02/10/2022 1520   HDL 63 02/10/2022 1520   CHOLHDL 3.8 02/10/2022 1520   VLDL 33.8 05/13/2019 1228   LDLCALC 154 (H) 02/10/2022 1520    CBC  Component Value Date/Time   WBC 7.9 02/10/2022 1520   RBC 4.01 02/10/2022 1520   HGB 13.3 02/10/2022 1520   HCT 39.2 02/10/2022 1520   PLT 271 02/10/2022 1520   MCV 97.8 02/10/2022 1520   MCH 33.2 (H) 02/10/2022 1520   MCHC 33.9 02/10/2022 1520   RDW 12.2 02/10/2022 1520   LYMPHSABS 1.5 12/31/2019 0904   MONOABS 1.0 12/31/2019 0904   EOSABS 0.1 12/31/2019 0904   BASOSABS 0.0 12/31/2019 0904    Hgb A1C Lab Results  Component Value Date   HGBA1C 5.1 02/10/2022           Assessment & Plan:   Abnormal TSH:  Will check TSH and Free T4 today Discussed staring Levothyroxine if labs are again abnormal  RTC in 2 months, follow up chronic conditions Webb Silversmith, NP

## 2022-05-22 NOTE — Patient Instructions (Signed)

## 2022-05-23 LAB — TSH+FREE T4: TSH W/REFLEX TO FT4: 8.17 mIU/L — ABNORMAL HIGH (ref 0.40–4.50)

## 2022-05-23 LAB — T4, FREE: Free T4: 0.7 ng/dL — ABNORMAL LOW (ref 0.8–1.8)

## 2022-05-23 MED ORDER — LEVOTHYROXINE SODIUM 25 MCG PO TABS: 25.0000 ug | ORAL_TABLET | Freq: Every day | ORAL | 0 refills | Status: DC

## 2022-05-23 NOTE — Addendum Note (Signed)
Addended by: Jearld Fenton on: 05/23/2022 09:17 AM   Modules accepted: Orders

## 2022-05-24 ENCOUNTER — Other Ambulatory Visit: Payer: Self-pay | Admitting: Internal Medicine

## 2022-05-24 ENCOUNTER — Telehealth: Payer: Self-pay | Admitting: Internal Medicine

## 2022-05-24 NOTE — Telephone Encounter (Signed)
Pt called requesting to have her Levothyroxine transferred to the [facility] on [address] and [address]. Please advise

## 2022-05-24 NOTE — Telephone Encounter (Signed)
Called to let pt know that her sunglasses (that was left on 05/22/2022) will be here in the pt pick-up area and she can get on 06/22/2022 at 2:30 was unable to lmom voice mail was full.

## 2022-05-24 NOTE — Telephone Encounter (Signed)
Requested medication (s) are due for refill today: Yes  Requested medication (s) are on the active medication list: Yes  Last refill:  05/23/21 (wrong pharmacy)  Future visit scheduled:Yes  Notes to clinic:  Unable to refill per protocol due to failed labs, no updated results.Resend to another pharmacy.      Requested Prescriptions  Pending Prescriptions Disp Refills   levothyroxine (SYNTHROID) 25 MCG tablet 90 tablet 0    Sig: Take 1 tablet (25 mcg total) by mouth daily.     Endocrinology:  Hypothyroid Agents Failed - 05/24/2022  5:14 PM      Failed - TSH in normal range and within 360 days    TSH  Date Value Ref Range Status  05/13/2021 2.65 0.40 - 4.50 mIU/L Final         Passed - Valid encounter within last 12 months    Recent Outpatient Visits           2 days ago Abnormal TSH   [place], NP   3 months ago Encounter for general adult medical examination with abnormal findings   [place], NP   4 months ago No-show for appointment   [facility] [place], Coralie Keens, NP   8 months ago Mixed hyperlipidemia   [facility] [place], Coralie Keens, NP   1 year ago Functional diarrhea   [facility] [place], Coralie Keens, NP       Future Appointments             In 1 month Baity, Coralie Keens, NP [facility], [place]   In 2 months [place], Coralie Keens, NP [facility], [facility]

## 2022-05-25 MED ORDER — LEVOTHYROXINE SODIUM 25 MCG PO TABS: 25.0000 ug | ORAL_TABLET | Freq: Every day | ORAL | 0 refills | Status: DC

## 2022-06-22 ENCOUNTER — Ambulatory Visit: Payer: Medicare PPO

## 2022-07-04 ENCOUNTER — Ambulatory Visit: Payer: Medicare HMO | Admitting: Neurology

## 2022-07-19 ENCOUNTER — Other Ambulatory Visit: Payer: Self-pay | Admitting: Neurology

## 2022-07-21 ENCOUNTER — Ambulatory Visit: Payer: Medicare PPO | Admitting: Internal Medicine

## 2022-07-21 NOTE — Progress Notes (Deleted)
Subjective:    Patient ID: [name] [name], female    DOB: [birth year], 72 y.o.   MRN: [id number]  HPI  Patient presents to clinic today for follow-up of chronic conditions.  HTN: Her BP today is.  She is taking Losartan and HCTZ as prescribed.  ECG from 10/2017 reviewed.  MCI: She reports difficulty with short-term memory.  She is taking Memantine as prescribed.  MRI brain from 10/2020 reviewed.  She follows with neurology.  OA: Mainly in her back.  She takes Tramadol as needed with good relief of symptoms.  She does not follow with orthopedics.  Anxiety and Depression: Chronic, managed on Mirtazapine and Buspirone.  She is not currently seeing a therapist.  She denies SI/HI.  Alpha Gal: Managed with probiotic.  She follows with GI.  HLD: Her last LDL was 154, triglycerides 92, 01/2022.  She denies myalgias on Rosuvastatin.  She does not consume low-fat diet.  Hypothyroidism: She denies any issues on her current dose of Levothyroxine.  She does not follow with endocrinology.  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 Swallow whole. 30 tablet 12   busPIRone (BUSPAR) 5 MG tablet Take 1 tablet (5 mg total) by mouth 3 (three) times daily. 270 tablet 1   Cholecalciferol (VITAMIN D3) 50 MCG (2000 UT) TABS Take 1 tablet by mouth daily.     Cyanocobalamin (B-12) 5000 MCG CAPS Take 1 capsule by mouth daily.     hydrochlorothiazide (HYDRODIURIL) 25 MG tablet Take 1  tablet (25 mg total) by mouth daily. 90 tablet 1   levothyroxine (SYNTHROID) 25 MCG tablet Take 1 tablet (25 mcg total) by mouth daily. 90 tablet 0   losartan (COZAAR) 100 MG tablet Take 1 tablet (100 mg total) by mouth daily. 90 tablet 1   memantine (NAMENDA) 10 MG tablet Take 1 tablet (10 mg total) by mouth 2 (two) times daily. 180 tablet 3   mirtazapine (REMERON) 7.5 MG tablet Take 1 tablet (7.5 mg total) by mouth at bedtime. 30 tablet 2   Naltrexone (VIVITROL) 380 MG SUSR Inject into the muscle.     rosuvastatin (CRESTOR) 20 MG tablet Take 1 tablet (20 mg total) by mouth daily. 90 tablet 1   traMADol (ULTRAM) 50 MG tablet Take 1 tablet (50 mg total) by mouth every 8 (eight) hours as needed. for pain 30 tablet 0   No current facility-administered medications for this visit.    Allergies  Allergen Reactions   Erythromycin Other (See Comments)    REACTION: GI upset   Penicillins Nausea And Vomiting    Family History  Problem Relation Age of Onset   Diabetes Mother    Stroke Mother    Other Mother        colon sugery   Polymyalgia rheumatica Mother    Memory loss Mother        late 27s   Alcoholism Mother    Memory loss Father  Heart disease Father    Diabetes Father    Dementia Father        unspecified type; late 70s/early 80s   Colon cancer Neg Hx    Cancer Neg Hx    Esophageal cancer Neg Hx    Pancreatic cancer Neg Hx    Stomach cancer Neg Hx     Social History   Socioeconomic History   Marital status: Married    Spouse name: Not on file   Number of children: 2   Years of education: 16   Highest education level: Bachelor's degree (e.g., BA, AB, BS)  Occupational History   Occupation: Retired    Comment: Education officer, museum  Tobacco Use   Smoking status: Every Day    Packs/day: 1.00    Types: Cigarettes   Smokeless tobacco: Never  Vaping Use   Vaping Use: Former  Substance and Sexual Activity   Alcohol use: Not Currently    Comment: occ   Drug use: No    Sexual activity: Not Currently    Birth control/protection: Post-menopausal  Other Topics Concern   Not on file  Social History Narrative   Not on file   Social Determinants of Health   Financial Resource Strain: Not on file  Food Insecurity: Not on file  Transportation Needs: Not on file  Physical Activity: Not on file  Stress: Not on file  Social Connections: Not on file  Intimate Partner Violence: Not on file     Constitutional: Denies fever, malaise, fatigue, headache or abrupt weight changes.  HEENT: Denies eye pain, eye redness, ear pain, ringing in the ears, wax buildup, runny nose, nasal congestion, bloody nose, or sore throat. Respiratory: Denies difficulty breathing, shortness of breath, cough or sputum production.   Cardiovascular: Denies chest pain, chest tightness, palpitations or swelling in the hands or feet.  Gastrointestinal: Patient reports intermittent diarrhea.  Denies abdominal pain, bloating, constipation, or blood in the stool.  GU: Denies urgency, frequency, pain with urination, burning sensation, blood in urine, odor or discharge. Musculoskeletal: Patient reports chronic back pain.  Denies decrease in range of motion, difficulty with gait, or joint swelling.  Skin: Denies redness, rashes, lesions or ulcercations.  Neurological: Patient reports difficulty with memory.  Denies dizziness, difficulty with speech or problems with balance and coordination.  Psych: Patient has a history of anxiety and depression.  Denies SI/HI.  No other specific complaints in a complete review of systems (except as listed in HPI above).  Objective:   Physical Exam   There were no vitals taken for this visit. Wt Readings from Last 3 Encounters:  05/22/22 101 lb (45.8 kg)  04/20/22 102 lb (46.3 kg)  02/10/22 105 lb (47.6 kg)    General: Appears their stated age, well developed, well nourished in NAD. Skin: Warm, dry and intact. No rashes, lesions or ulcerations  noted. HEENT: Head: normal shape and size; Eyes: sclera white, no icterus, conjunctiva pink, PERRLA and EOMs intact; Ears: Tm's gray and intact, normal light reflex; Nose: mucosa pink and moist, septum midline; Throat/Mouth: Teeth present, mucosa pink and moist, no exudate, lesions or ulcerations noted.  Neck:  Neck supple, trachea midline. No masses, lumps or thyromegaly present.  Cardiovascular: Normal rate and rhythm. S1,S2 noted.  No murmur, rubs or gallops noted. No JVD or BLE edema. No carotid bruits noted. Pulmonary/Chest: Normal effort and positive vesicular breath sounds. No respiratory distress. No wheezes, rales or ronchi noted.  Abdomen: Soft and nontender. Normal bowel sounds. No distention  or masses noted. Liver, spleen and kidneys non palpable. Musculoskeletal: Normal range of motion. No signs of joint swelling. No difficulty with gait.  Neurological: Alert and oriented. Cranial nerves II-XII grossly intact. Coordination normal.  Psychiatric: Mood and affect normal. Behavior is normal. Judgment and thought content normal.    BMET    Component Value Date/Time   NA 139 02/10/2022 1520   K 3.6 02/10/2022 1520   CL 105 02/10/2022 1520   CO2 26 02/10/2022 1520   GLUCOSE 85 02/10/2022 1520   BUN 11 02/10/2022 1520   CREATININE 0.75 02/10/2022 1520   CALCIUM 9.7 02/10/2022 1520   GFRNONAA >60 01/01/2020 0405   GFRAA >60 01/01/2020 0405    Lipid Panel     Component Value Date/Time   CHOL 237 (H) 02/10/2022 1520   TRIG 92 02/10/2022 1520   HDL 63 02/10/2022 1520   CHOLHDL 3.8 02/10/2022 1520   VLDL 33.8 05/13/2019 1228   LDLCALC 154 (H) 02/10/2022 1520    CBC    Component Value Date/Time   WBC 7.9 02/10/2022 1520   RBC 4.01 02/10/2022 1520   HGB 13.3 02/10/2022 1520   HCT 39.2 02/10/2022 1520   PLT 271 02/10/2022 1520   MCV 97.8 02/10/2022 1520   MCH 33.2 (H) 02/10/2022 1520   MCHC 33.9 02/10/2022 1520   RDW 12.2 02/10/2022 1520   LYMPHSABS 1.5 12/31/2019 0904    MONOABS 1.0 12/31/2019 0904   EOSABS 0.1 12/31/2019 0904   BASOSABS 0.0 12/31/2019 0904    Hgb A1C Lab Results  Component Value Date   HGBA1C 5.1 02/10/2022           Assessment & Plan:     RTC in 6 months for annual exam Webb Silversmith, NP

## 2022-08-11 ENCOUNTER — Encounter: Payer: Self-pay | Admitting: Internal Medicine

## 2022-08-11 ENCOUNTER — Ambulatory Visit (INDEPENDENT_AMBULATORY_CARE_PROVIDER_SITE_OTHER): Payer: Medicare PPO | Admitting: Internal Medicine

## 2022-08-11 VITALS — BP 164/88 | HR 70 | Temp 96.8°F | Wt 100.0 lb

## 2022-08-11 DIAGNOSIS — E039 Hypothyroidism, unspecified: Secondary | ICD-10-CM

## 2022-08-11 DIAGNOSIS — F419 Anxiety disorder, unspecified: Secondary | ICD-10-CM | POA: Diagnosis not present

## 2022-08-11 DIAGNOSIS — E7800 Pure hypercholesterolemia, unspecified: Secondary | ICD-10-CM

## 2022-08-11 DIAGNOSIS — F1011 Alcohol abuse, in remission: Secondary | ICD-10-CM

## 2022-08-11 DIAGNOSIS — M47818 Spondylosis without myelopathy or radiculopathy, sacral and sacrococcygeal region: Secondary | ICD-10-CM | POA: Diagnosis not present

## 2022-08-11 DIAGNOSIS — F32A Depression, unspecified: Secondary | ICD-10-CM

## 2022-08-11 DIAGNOSIS — K529 Noninfective gastroenteritis and colitis, unspecified: Secondary | ICD-10-CM

## 2022-08-11 DIAGNOSIS — G3184 Mild cognitive impairment, so stated: Secondary | ICD-10-CM | POA: Diagnosis not present

## 2022-08-11 DIAGNOSIS — R7303 Prediabetes: Secondary | ICD-10-CM

## 2022-08-11 DIAGNOSIS — I10 Essential (primary) hypertension: Secondary | ICD-10-CM

## 2022-08-11 MED ORDER — OLMESARTAN MEDOXOMIL-HCTZ 40-25 MG PO TABS: 1.0000 | ORAL_TABLET | Freq: Every day | ORAL | 0 refills | Status: DC

## 2022-08-11 MED ORDER — AMLODIPINE BESYLATE 10 MG PO TABS: 10.0000 mg | ORAL_TABLET | Freq: Every day | ORAL | 0 refills | Status: DC

## 2022-08-11 NOTE — Assessment & Plan Note (Signed)
A1c today.  

## 2022-08-11 NOTE — Assessment & Plan Note (Signed)
Continue tramadol as needed 

## 2022-08-11 NOTE — Assessment & Plan Note (Signed)
She is not currently sober, had 2 glasses of wine earlier in the week but she feels like she is maintaining well

## 2022-08-11 NOTE — Progress Notes (Signed)
Subjective:    Patient ID: [name] [name], female    DOB: [birth year], 72 y.o.   MRN: [id number]  HPI  Patient presents to clinic today for follow-up of chronic conditions.  HTN: Her BP today is 176/92. She is taking Losartan and HCTZ as prescribed. ECG from 10/2017 reviewed.  MCI: She reports persistent memory issues. Managed on Memantine. MRI brain from 10/2020 reviewed. She follows with neurology.  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 She takes Tramadol as needed. She does not follow with orthopedics.  Anxiety and Depressoin: Deteriored.  She recently found out that her husband was cheating on her.  She is taking mirtazapine and Buspirone and do feel like they are working well for her. She is not currently seeing a therapist. She deneis SI/HI.  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  Improved.  She is not currently taking any medications for this.  She follows with GI.  HLD: Her last LDL was 154, triglycericeds 92, 01/2022. She denies myalgias on Rosuvastatin.  She has been trying to consume low-fat diet.  Hypothyroidism: She is unsure if she is actually taking Levothyroxine.  She does not follow with endocrinology.  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 Swallow whole. 30 tablet 12   busPIRone (BUSPAR) 5 MG tablet Take 1 tablet (5 mg total) by mouth 3 (three) times daily. 270 tablet 1   Cholecalciferol (VITAMIN D3)  50 MCG (2000 UT) TABS Take 1 tablet by mouth daily.     Cyanocobalamin (B-12) 5000 MCG CAPS Take 1 capsule by mouth daily.     hydrochlorothiazide (HYDRODIURIL) 25 MG tablet Take 1 tablet (25 mg total) by mouth daily. 90 tablet 1   losartan (COZAAR) 100 MG tablet Take 1 tablet (100 mg total) by mouth daily. 90 tablet 1   memantine (NAMENDA) 10 MG tablet Take 1 tablet (10 mg total) by mouth 2 (two) times daily. 180 tablet 3   mirtazapine (REMERON) 7.5 MG tablet Take 1 tablet (7.5 mg total) by mouth at bedtime. 30 tablet 2   Naltrexone (VIVITROL) 380 MG SUSR Inject into the muscle.     rosuvastatin (CRESTOR) 20 MG tablet Take 1 tablet (20 mg total) by mouth daily. 90 tablet 1   traMADol (ULTRAM) 50 MG tablet Take 1 tablet (50 mg total) by mouth every 8 (eight) hours as needed. for pain 30 tablet 0   levothyroxine (SYNTHROID) 25 MCG tablet Take 1 tablet (25 mcg total) by mouth daily. (Patient not taking: Reported on 08/11/2022) 90 tablet 0   No current facility-administered medications for this visit.    Allergies  Allergen Reactions   Erythromycin Other (See Comments)    REACTION: GI upset   Penicillins Nausea And Vomiting    Family History  Problem Relation Age of Onset   Diabetes Mother    Stroke Mother    Other Mother  colon sugery   Polymyalgia rheumatica Mother    Memory loss Mother        late 28s   Alcoholism Mother    Memory loss Father    Heart disease Father    Diabetes Father    Dementia Father        unspecified type; late 70s/early 79s   Colon cancer Neg Hx    Cancer Neg Hx    Esophageal cancer Neg Hx    Pancreatic cancer Neg Hx    Stomach cancer Neg Hx     Social History   Socioeconomic History   Marital status: Married    Spouse name: Not on file   Number of children: 2   Years of education: 16   Highest education level: Bachelor's degree (e.g., BA, AB, BS)  Occupational History   Occupation: Retired    Comment: Education officer, museum  Tobacco Use    Smoking status: Every Day    Packs/day: 1    Types: Cigarettes   Smokeless tobacco: Never  Vaping Use   Vaping Use: Former  Substance and Sexual Activity   Alcohol use: Not Currently    Comment: occ   Drug use: No   Sexual activity: Not Currently    Birth control/protection: Post-menopausal  Other Topics Concern   Not on file  Social History Narrative   Not on file   Social Determinants of Health   Financial Resource Strain: Not on file  Food Insecurity: Not on file  Transportation Needs: Not on file  Physical Activity: Not on file  Stress: Not on file  Social Connections: Not on file  Intimate Partner Violence: Not on file     Constitutional: Patient reports fatigue.  Denies fever, malaise, headache or abrupt weight changes.  HEENT: Denies eye pain, eye redness, ear pain, ringing in the ears, wax buildup, runny nose, nasal congestion, bloody nose, or sore throat. Respiratory: Denies difficulty breathing, shortness of breath, cough or sputum production.   Cardiovascular: Denies chest pain, chest tightness, palpitations or swelling in the hands or feet.  Gastrointestinal: Patient reports intermittent diarrhea.  Denies abdominal pain, bloating, constipation, or blood in the stool.  GU: Denies urgency, frequency, pain with urination, burning sensation, blood in urine, odor or discharge. Musculoskeletal: Patient reports intermittent pain in her tailbone.  Denies decrease in range of motion, difficulty with gait, muscle pain or joint swelling.  Skin: Denies redness, rashes, lesions or ulcercations.  Neurological: Patient reports difficulty with memory.  Denies dizziness, difficulty with speech or problems with balance and coordination.  Psych: Patient has a history of anxiety and depression.  Denies SI/HI.  No other specific complaints in a complete review of systems (except as listed in HPI above).  Objective:   Physical Exam  BP (!) 164/88 (BP Location: Right Arm, Patient  Position: Sitting, Cuff Size: Normal)   Pulse 70   Temp (!) 96.8 F (36 C) (Temporal)   Wt 100 lb (45.4 kg)   SpO2 99%   BMI 20.20 kg/m  Wt Readings from Last 3 Encounters:  08/11/22 100 lb (45.4 kg)  05/22/22 101 lb (45.8 kg)  04/20/22 102 lb (46.3 kg)    General: Appears her stated age, well developed, well nourished in NAD. Skin: Warm, dry and intact.  HEENT: Head: normal shape and size; Eyes: sclera white, no icterus, conjunctiva pink, PERRLA and EOMs intact;  Neck:  Neck supple, trachea midline. No masses, lumps or thyromegaly present.  Cardiovascular: Normal rate and rhythm. S1,S2 noted.  No murmur, rubs or gallops noted. No JVD or BLE edema. No carotid bruits noted. Pulmonary/Chest: Normal effort and positive vesicular breath sounds. No respiratory distress. No wheezes, rales or ronchi noted.  Abdomen: Soft and nontender. Normal bowel sounds.  Musculoskeletal: No difficulty with gait.  Neurological: Alert and oriented.  Coordination normal.  Psychiatric: Mood and affect normal.  Tearful.  Judgment and thought content normal.    BMET    Component Value Date/Time   NA 139 02/10/2022 1520   K 3.6 02/10/2022 1520   CL 105 02/10/2022 1520   CO2 26 02/10/2022 1520   GLUCOSE 85 02/10/2022 1520   BUN 11 02/10/2022 1520   CREATININE 0.75 02/10/2022 1520   CALCIUM 9.7 02/10/2022 1520   GFRNONAA >60 01/01/2020 0405   GFRAA >60 01/01/2020 0405    Lipid Panel     Component Value Date/Time   CHOL 237 (H) 02/10/2022 1520   TRIG 92 02/10/2022 1520   HDL 63 02/10/2022 1520   CHOLHDL 3.8 02/10/2022 1520   VLDL 33.8 05/13/2019 1228   LDLCALC 154 (H) 02/10/2022 1520    CBC    Component Value Date/Time   WBC 7.9 02/10/2022 1520   RBC 4.01 02/10/2022 1520   HGB 13.3 02/10/2022 1520   HCT 39.2 02/10/2022 1520   PLT 271 02/10/2022 1520   MCV 97.8 02/10/2022 1520   MCH 33.2 (H) 02/10/2022 1520   MCHC 33.9 02/10/2022 1520   RDW 12.2 02/10/2022 1520   LYMPHSABS 1.5  12/31/2019 0904   MONOABS 1.0 12/31/2019 0904   EOSABS 0.1 12/31/2019 0904   BASOSABS 0.0 12/31/2019 0904    Hgb A1C Lab Results  Component Value Date   HGBA1C 5.1 02/10/2022            Assessment & Plan:    RTC in 2 weeks, follow-up HTN, 6 months for your annual exam Webb Silversmith, NP

## 2022-08-11 NOTE — Assessment & Plan Note (Signed)
Continue memantine She will continue to follow with neurology

## 2022-08-11 NOTE — Assessment & Plan Note (Signed)
Avoid red meat Okay to take Imodium OTC as needed

## 2022-08-11 NOTE — Assessment & Plan Note (Signed)
TSH and free T4 today We will adjust levothyroxine if needed based on labs 

## 2022-08-11 NOTE — Assessment & Plan Note (Signed)
Uncontrolled on losartan HCTZ Rx for amlodipine 10 mg daily Rx for olmesartan-HCTZ 40-12.5 Reinforced DASH diet C-Met today

## 2022-08-11 NOTE — Assessment & Plan Note (Signed)
C-Met and lipid profile today Encouraged her to consume low-fat diet Continue rosuvastatin 

## 2022-08-11 NOTE — Patient Instructions (Signed)
Hypertension, Adult High blood pressure (hypertension) is when the force of blood pumping through the arteries is too strong. The arteries are the blood vessels that carry blood from the heart throughout the body. Hypertension forces the heart to work harder to pump blood and may cause arteries to become narrow or stiff. Untreated or uncontrolled hypertension can lead to a heart attack, heart failure, a stroke, kidney disease, and other problems. A blood pressure reading consists of a higher number over a lower number. Ideally, your blood pressure should be below 120/80. The first ("top") number is called the systolic pressure. It is a measure of the pressure in your arteries as your heart beats. The second ("bottom") number is called the diastolic pressure. It is a measure of the pressure in your arteries as the heart relaxes. What are the causes? The exact cause of this condition is not known. There are some conditions that result in high blood pressure. What increases the risk? Certain factors may make you more likely to develop high blood pressure. Some of these risk factors are under your control, including: Smoking. Not getting enough exercise or physical activity. Being overweight. Having too much fat, sugar, calories, or salt (sodium) in your diet. Drinking too much alcohol. Other risk factors include: Having a personal history of heart disease, diabetes, high cholesterol, or kidney disease. Stress. Having a family history of high blood pressure and high cholesterol. Having obstructive sleep apnea. Age. The risk increases with age. What are the signs or symptoms? High blood pressure may not cause symptoms. Very high blood pressure (hypertensive crisis) may cause: Headache. Fast or irregular heartbeats (palpitations). Shortness of breath. Nosebleed. Nausea and vomiting. Vision changes. Severe chest pain, dizziness, and seizures. How is this diagnosed? This condition is diagnosed by  measuring your blood pressure while you are seated, with your arm resting on a flat surface, your legs uncrossed, and your feet flat on the floor. The cuff of the blood pressure monitor will be placed directly against the skin of your upper arm at the level of your heart. Blood pressure should be measured at least twice using the same arm. Certain conditions can cause a difference in blood pressure between your right and left arms. If you have a high blood pressure reading during one visit or you have normal blood pressure with other risk factors, you may be asked to: Return on a different day to have your blood pressure checked again. Monitor your blood pressure at home for 1 week or longer. If you are diagnosed with hypertension, you may have other blood or imaging tests to help your health care provider understand your overall risk for other conditions. How is this treated? This condition is treated by making healthy lifestyle changes, such as eating healthy foods, exercising more, and reducing your alcohol intake. You may be referred for counseling on a healthy diet and physical activity. Your health care provider may prescribe medicine if lifestyle changes are not enough to get your blood pressure under control and if: Your systolic blood pressure is above 130. Your diastolic blood pressure is above 80. Your personal target blood pressure may vary depending on your medical conditions, your age, and other factors. Follow these instructions at home: Eating and drinking  Eat a diet that is high in fiber and potassium, and low in sodium, added sugar, and fat. An example of this eating plan is called the DASH diet. DASH stands for Dietary Approaches to Stop Hypertension. To eat this way: Eat   plenty of fresh fruits and vegetables. Try to fill one half of your plate at each meal with fruits and vegetables. Eat whole grains, such as whole-wheat pasta, brown rice, or whole-grain bread. Fill about one  fourth of your plate with whole grains. Eat or drink low-fat dairy products, such as skim milk or low-fat yogurt. Avoid fatty cuts of meat, processed or cured meats, and poultry with skin. Fill about one fourth of your plate with lean proteins, such as fish, chicken without skin, beans, eggs, or tofu. Avoid pre-made and processed foods. These tend to be higher in sodium, added sugar, and fat. Reduce your daily sodium intake. Many people with hypertension should eat less than 1,500 mg of sodium a day. Do not drink alcohol if: Your health care provider tells you not to drink. You are pregnant, may be pregnant, or are planning to become pregnant. If you drink alcohol: Limit how much you have to: 0-1 drink a day for women. 0-2 drinks a day for men. Know how much alcohol is in your drink. In the [place] one drink equals one 12 oz bottle of beer (355 mL), one 5 oz glass of wine (148 mL), or one 1 oz glass of hard liquor (44 mL). Lifestyle  Work with your health care provider to maintain a healthy body weight or to lose weight. Ask what an ideal weight is for you. Get at least 30 minutes of exercise that causes your heart to beat faster (aerobic exercise) most days of the week. Activities may include walking, swimming, or biking. Include exercise to strengthen your muscles (resistance exercise), such as Pilates or lifting weights, as part of your weekly exercise routine. Try to do these types of exercises for 30 minutes at least 3 days a week. Do not use any products that contain nicotine or tobacco. These products include cigarettes, chewing tobacco, and vaping devices, such as e-cigarettes. If you need help quitting, ask your health care provider. Monitor your blood pressure at home as told by your health care provider. Keep all follow-up visits. This is important. Medicines Take over-the-counter and prescription medicines only as told by your health care provider. Follow directions carefully. Blood  pressure medicines must be taken as prescribed. Do not skip doses of blood pressure medicine. Doing this puts you at risk for problems and can make the medicine less effective. Ask your health care provider about side effects or reactions to medicines that you should watch for. Contact a health care provider if you: Think you are having a reaction to a medicine you are taking. Have headaches that keep coming back (recurring). Feel dizzy. Have swelling in your ankles. Have trouble with your vision. Get help right away if you: Develop a severe headache or confusion. Have unusual weakness or numbness. Feel faint. Have severe pain in your chest or abdomen. Vomit repeatedly. Have trouble breathing. These symptoms may be an emergency. Get help right away. Call 911. Do not wait to see if the symptoms will go away. Do not drive yourself to the hospital. Summary Hypertension is when the force of blood pumping through your arteries is too strong. If this condition is not controlled, it may put you at risk for serious complications. Your personal target blood pressure may vary depending on your medical conditions, your age, and other factors. For most people, a normal blood pressure is less than 120/80. Hypertension is treated with lifestyle changes, medicines, or a combination of both. Lifestyle changes include losing weight, eating a healthy,   low-sodium diet, exercising more, and limiting alcohol. This information is not intended to replace advice given to you by your health care provider. Make sure you discuss any questions you have with your health care provider. Document Revised: 03/08/2021 Document Reviewed: 03/08/2021 Elsevier Patient Education  2023 [employer] Inc.  

## 2022-08-11 NOTE — Assessment & Plan Note (Signed)
Deteriorated Continue mirtazapine and buspirone She declines referral for therapist at this time Support offered

## 2022-08-12 LAB — HEMOGLOBIN A1C
Hgb A1c MFr Bld: 5.5 % of total Hgb (ref <5.7)
Mean Plasma Glucose: 111 mg/dL
eAG (mmol/L): 6.2 mmol/L

## 2022-08-12 LAB — LIPID PANEL
Cholesterol: 224 mg/dL — ABNORMAL HIGH (ref <200)
HDL: 62 mg/dL (ref >=50)
LDL Cholesterol (Calc): 140 mg/dL (calc) — ABNORMAL HIGH
Non-HDL Cholesterol (Calc): 162 mg/dL (calc) — ABNORMAL HIGH (ref <130)
Total CHOL/HDL Ratio: 3.6 (calc) (ref <5.0)
Triglycerides: 104 mg/dL (ref <150)

## 2022-08-12 LAB — COMPLETE METABOLIC PANEL WITH GFR
AG Ratio: 1.6 (calc) (ref 1.0–2.5)
ALT: 10 U/L (ref 6–29)
AST: 17 U/L (ref 10–35)
Albumin: 4.5 g/dL (ref 3.6–5.1)
Alkaline phosphatase (APISO): 51 U/L (ref 37–153)
BUN: 24 mg/dL (ref 7–25)
CO2: 28 mmol/L (ref 20–32)
Calcium: 9.9 mg/dL (ref 8.6–10.4)
Chloride: 104 mmol/L (ref 98–110)
Creat: 0.82 mg/dL (ref 0.60–1.00)
Globulin: 2.8 g/dL (calc) (ref 1.9–3.7)
Glucose, Bld: 95 mg/dL (ref 65–99)
Potassium: 4.5 mmol/L (ref 3.5–5.3)
Sodium: 141 mmol/L (ref 135–146)
Total Bilirubin: 0.5 mg/dL (ref 0.2–1.2)
Total Protein: 7.3 g/dL (ref 6.1–8.1)
eGFR: 77 mL/min/{1.73_m2} (ref >=60)

## 2022-08-12 LAB — CBC
HCT: 38.6 % (ref 35.0–45.0)
Hemoglobin: 13.1 g/dL (ref 11.7–15.5)
MCH: 32.6 pg (ref 27.0–33.0)
MCHC: 33.9 g/dL (ref 32.0–36.0)
MCV: 96 fL (ref 80.0–100.0)
MPV: 11.1 fL (ref 7.5–12.5)
Platelets: 341 10*3/uL (ref 140–400)
RBC: 4.02 10*6/uL (ref 3.80–5.10)
RDW: 13.2 % (ref 11.0–15.0)
WBC: 7.2 10*3/uL (ref 3.8–10.8)

## 2022-08-12 LAB — TSH: TSH: 13.46 mIU/L — ABNORMAL HIGH (ref 0.40–4.50)

## 2022-08-12 LAB — T4, FREE: Free T4: 0.6 ng/dL — ABNORMAL LOW (ref 0.8–1.8)

## 2022-08-25 ENCOUNTER — Ambulatory Visit: Payer: Medicare PPO | Admitting: Internal Medicine

## 2022-08-25 NOTE — Progress Notes (Deleted)
Subjective:    Patient ID: [name] [name], female    DOB: [birth year], 72 y.o.   MRN: [id number]  HPI  Patient presents to clinic today for 2-week follow-up of HTN.  At her last visit, her BP was elevated.  Her Losartan HCT was changed to Olmesartan HCT and amlodipine 10 mg was added.  She has been taking the medication as prescribed.  Her BP today is.  ECG from 10/2017 reviewed.  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 90 tablet 0   aspirin 81 MG EC tablet Take 1 tablet (81 mg total) by mouth daily. Swallow whole. 30 tablet 12   busPIRone (BUSPAR) 5 MG tablet Take 1 tablet (5 mg total) by mouth 3 (three) times daily. 270 tablet 1   Cholecalciferol (VITAMIN D3) 50 MCG (2000 UT) TABS Take 1 tablet by mouth daily.     Cyanocobalamin (B-12) 5000 MCG CAPS Take 1 capsule by mouth daily.     levothyroxine (SYNTHROID) 25 MCG tablet Take 1 tablet (25 mcg total) by mouth daily. (Patient not taking: Reported on 08/11/2022) 90 tablet 0   memantine (NAMENDA) 10 MG tablet Take 1 tablet (10 mg total) by mouth 2 (two) times daily. 180 tablet 3   mirtazapine (REMERON) 7.5 MG tablet Take 1 tablet (7.5 mg total) by mouth at bedtime. 30 tablet 2   Naltrexone (VIVITROL) 380 MG SUSR Inject into the muscle.     olmesartan-hydrochlorothiazide (BENICAR HCT) 40-25 MG tablet Take 1 tablet by mouth daily. 90 tablet 0   rosuvastatin (CRESTOR) 20 MG tablet Take 1 tablet (20 mg  total) by mouth daily. 90 tablet 1   traMADol (ULTRAM) 50 MG tablet Take 1 tablet (50 mg total) by mouth every 8 (eight) hours as needed. for pain 30 tablet 0   No current facility-administered medications for this visit.    Allergies  Allergen Reactions   Erythromycin Other (See Comments)    REACTION: GI upset   Penicillins Nausea And Vomiting    Family History  Problem Relation Age of Onset   Diabetes Mother    Stroke Mother    Other Mother        colon sugery   Polymyalgia rheumatica Mother    Memory loss Mother        late 43s   Alcoholism Mother    Memory loss Father    Heart disease Father    Diabetes Father    Dementia Father        unspecified type; late 70s/early 33s   Colon cancer Neg Hx    Cancer Neg Hx    Esophageal cancer Neg Hx    Pancreatic cancer Neg Hx    Stomach cancer Neg Hx     Social History   Socioeconomic History   Marital status: Married    Spouse name: Not on file   Number of children: 2   Years of education: 34  Highest education level: Bachelor's degree (e.g., BA, AB, BS)  Occupational History   Occupation: Retired    Comment: Engineer, site  Tobacco Use   Smoking status: Every Day    Packs/day: 1    Types: Cigarettes   Smokeless tobacco: Never  Vaping Use   Vaping Use: Former  Substance and Sexual Activity   Alcohol use: Not Currently    Comment: occ   Drug use: No   Sexual activity: Not Currently    Birth control/protection: Post-menopausal  Other Topics Concern   Not on file  Social History Narrative   Not on file   Social Determinants of Health   Financial Resource Strain: Not on file  Food Insecurity: Not on file  Transportation Needs: Not on file  Physical Activity: Not on file  Stress: Not on file  Social Connections: Not on file  Intimate Partner Violence: Not on file     Constitutional: Denies fever, malaise, fatigue, headache or abrupt weight changes.  HEENT: Denies eye pain, eye redness, ear pain,  ringing in the ears, wax buildup, runny nose, nasal congestion, bloody nose, or sore throat. Respiratory: Denies difficulty breathing, shortness of breath, cough or sputum production.   Cardiovascular: Denies chest pain, chest tightness, palpitations or swelling in the hands or feet.  Gastrointestinal: Denies abdominal pain, bloating, constipation, diarrhea or blood in the stool.  GU: Denies urgency, frequency, pain with urination, burning sensation, blood in urine, odor or discharge. Musculoskeletal: Denies decrease in range of motion, difficulty with gait, muscle pain or joint pain and swelling.  Skin: Denies redness, rashes, lesions or ulcercations.  Neurological: Patient reports difficulty with memory.  Denies dizziness, difficulty with speech or problems with balance and coordination.  Psych: Patient has a history of anxiety and depression.  Denies SI/HI.  No other specific complaints in a complete review of systems (except as listed in HPI above).  Objective:   Physical Exam   There were no vitals taken for this visit. Wt Readings from Last 3 Encounters:  08/11/22 100 lb (45.4 kg)  05/22/22 101 lb (45.8 kg)  04/20/22 102 lb (46.3 kg)    General: Appears their stated age, well developed, well nourished in NAD. Skin: Warm, dry and intact. No rashes, lesions or ulcerations noted. HEENT: Head: normal shape and size; Eyes: sclera white, no icterus, conjunctiva pink, PERRLA and EOMs intact; Ears: Tm's gray and intact, normal light reflex; Nose: mucosa pink and moist, septum midline; Throat/Mouth: Teeth present, mucosa pink and moist, no exudate, lesions or ulcerations noted.  Neck:  Neck supple, trachea midline. No masses, lumps or thyromegaly present.  Cardiovascular: Normal rate and rhythm. S1,S2 noted.  No murmur, rubs or gallops noted. No JVD or BLE edema. No carotid bruits noted. Pulmonary/Chest: Normal effort and positive vesicular breath sounds. No respiratory distress. No wheezes,  rales or ronchi noted.  Abdomen: Soft and nontender. Normal bowel sounds. No distention or masses noted. Liver, spleen and kidneys non palpable. Musculoskeletal: Normal range of motion. No signs of joint swelling. No difficulty with gait.  Neurological: Alert and oriented. Cranial nerves II-XII grossly intact. Coordination normal.  Psychiatric: Mood and affect normal. Behavior is normal. Judgment and thought content normal.    BMET    Component Value Date/Time   NA 141 08/11/2022 1351   K 4.5 08/11/2022 1351   CL 104 08/11/2022 1351   CO2 28 08/11/2022 1351   GLUCOSE 95 08/11/2022 1351   BUN 24 08/11/2022 1351   CREATININE 0.82 08/11/2022 1351  CALCIUM 9.9 08/11/2022 1351   GFRNONAA >60 01/01/2020 0405   GFRAA >60 01/01/2020 0405    Lipid Panel     Component Value Date/Time   CHOL 224 (H) 08/11/2022 1351   TRIG 104 08/11/2022 1351   HDL 62 08/11/2022 1351   CHOLHDL 3.6 08/11/2022 1351   VLDL 33.8 05/13/2019 1228   LDLCALC 140 (H) 08/11/2022 1351    CBC    Component Value Date/Time   WBC 7.2 08/11/2022 1351   RBC 4.02 08/11/2022 1351   HGB 13.1 08/11/2022 1351   HCT 38.6 08/11/2022 1351   PLT 341 08/11/2022 1351   MCV 96.0 08/11/2022 1351   MCH 32.6 08/11/2022 1351   MCHC 33.9 08/11/2022 1351   RDW 13.2 08/11/2022 1351   LYMPHSABS 1.5 12/31/2019 0904   MONOABS 1.0 12/31/2019 0904   EOSABS 0.1 12/31/2019 0904   BASOSABS 0.0 12/31/2019 0904    Hgb A1C Lab Results  Component Value Date   HGBA1C 5.5 08/11/2022           Assessment & Plan:   RTC in 5 months for annual exam Nicki Reaper, NP

## 2022-08-29 ENCOUNTER — Other Ambulatory Visit: Payer: Self-pay | Admitting: Internal Medicine

## 2022-08-30 ENCOUNTER — Other Ambulatory Visit: Payer: Self-pay | Admitting: Internal Medicine

## 2022-08-30 NOTE — Telephone Encounter (Signed)
Requested Prescriptions  Pending Prescriptions Disp Refills   hydrochlorothiazide (HYDRODIURIL) 25 MG tablet [Pharmacy Med Name: HYDROCHLOROTHIAZIDE  TABLETS] 90 tablet 0    Sig: TAKE 1 TABLET(25 MG) BY MOUTH DAILY     Cardiovascular: Diuretics - Thiazide Failed - 08/29/2022  2:12 PM      Failed - Last BP in normal range    BP Readings from Last 1 Encounters:  08/11/22 (!) 164/88         Passed - Cr in normal range and within 180 days    Creat  Date Value Ref Range Status  08/11/2022 0.82 0.60 - 1.00 mg/dL Final         Passed - K in normal range and within 180 days    Potassium  Date Value Ref Range Status  08/11/2022 4.5 3.5 - 5.3 mmol/L Final         Passed - Na in normal range and within 180 days    Sodium  Date Value Ref Range Status  08/11/2022 141 135 - 146 mmol/L Final         Passed - Valid encounter within last 6 months    Recent Outpatient Visits           2 weeks ago Acquired hypothyroidism   [place] [place], Salvadore Oxford, NP   3 months ago Abnormal TSH   [place] [facility] [place], Salvadore Oxford, NP   6 months ago Encounter for general adult medical examination with abnormal findings   [place] [facility] [place], Salvadore Oxford, NP   7 months ago No-show for appointment   [facility] [place], Salvadore Oxford, NP   11 months ago Mixed hyperlipidemia   [place] [facility], Salvadore Oxford, NP               busPIRone (BUSPAR) 5 MG tablet [Pharmacy Med Name: BUSPIRONE  TABLETS] 270 tablet 0    Sig: TAKE 1 TABLET(5 MG) BY MOUTH THREE TIMES DAILY     Psychiatry: Anxiolytics/Hypnotics - Non-controlled Passed - 08/29/2022  2:12 PM      Passed - Valid encounter within last 12 months    Recent Outpatient Visits           2 weeks ago Acquired hypothyroidism   [place] [facility] [place], Salvadore Oxford, NP   3 months ago Abnormal TSH   [facility] [place], Salvadore Oxford, NP   6 months ago Encounter for general adult medical examination with abnormal findings   [place] [facility] [place], Salvadore Oxford, NP   7 months ago No-show for appointment   [facility] [place], Salvadore Oxford, NP   11 months ago Mixed hyperlipidemia    [facility] [place], [place] W, NP               losartan (COZAAR) 100 MG tablet [Pharmacy Med Name: LOSARTAN  TABLETS] 90 tablet 0    Sig: TAKE 1 TABLET(100 MG) BY MOUTH DAILY     Cardiovascular:  Angiotensin Receptor Blockers Failed - 08/29/2022  2:12 PM      Failed - Last BP in normal range    BP Readings from Last 1 Encounters:  08/11/22 (!) 164/88         Passed - Cr in normal range and within 180 days  Creat  Date Value Ref Range Status  08/11/2022 0.82 0.60 - 1.00 mg/dL Final         Passed - K in normal range and within 180 days    Potassium  Date Value Ref Range Status  08/11/2022 4.5 3.5 - 5.3 mmol/L Final         Passed - Patient is not pregnant      Passed - Valid encounter within last 6 months    Recent Outpatient Visits           2 weeks ago Acquired hypothyroidism   [facility] [place], Salvadore Oxford, NP   3 months ago Abnormal TSH   [place] [facility] [place], Salvadore Oxford, NP   6 months ago Encounter for general adult medical examination with abnormal findings   [facility] [place], Salvadore Oxford, NP   7 months ago No-show for appointment   [facility] [place], Salvadore Oxford, NP   11 months ago Mixed hyperlipidemia   [facility] [place], Salvadore Oxford, [place]

## 2022-08-31 ENCOUNTER — Other Ambulatory Visit: Payer: Self-pay | Admitting: Internal Medicine

## 2022-08-31 NOTE — Telephone Encounter (Signed)
Unable to refill per protocol, Rx expired. Discontinued .  Requested Prescriptions  Pending Prescriptions Disp Refills   losartan (COZAAR) 100 MG tablet [Pharmacy Med Name: LOSARTAN  TABLETS] 90 tablet     Sig: TAKE 1 TABLET BY MOUTH EVERY DAY     Cardiovascular:  Angiotensin Receptor Blockers Failed - 08/30/2022  4:25 PM      Failed - Last BP in normal range    BP Readings from Last 1 Encounters:  08/11/22 (!) 164/88         Passed - Cr in normal range and within 180 days    Creat  Date Value Ref Range Status  08/11/2022 0.82 0.60 - 1.00 mg/dL Final         Passed - K in normal range and within 180 days    Potassium  Date Value Ref Range Status  08/11/2022 4.5 3.5 - 5.3 mmol/L Final         Passed - Patient is not pregnant      Passed - Valid encounter within last 6 months    Recent Outpatient Visits           2 weeks ago Acquired hypothyroidism   [place] [place], Salvadore Oxford, NP   3 months ago Abnormal TSH   [place] [facility] [place], [place] W, NP   6 months ago Encounter for general adult medical examination with abnormal findings   [facility] [place], Salvadore Oxford, NP   7 months ago No-show for appointment   [facility], Salvadore Oxford, NP   11 months ago Mixed hyperlipidemia   [place] [facility] [place], Salvadore Oxford, NP               hydrochlorothiazide (HYDRODIURIL) 25 MG tablet [Pharmacy Med Name: HYDROCHLOROTHIAZIDE  TABLETS] 90 tablet 0    Sig: TAKE 1 TABLET BY MOUTH DAILY     Cardiovascular: Diuretics - Thiazide Failed - 08/30/2022  4:25 PM      Failed - Last BP in normal range    BP Readings from Last 1 Encounters:  08/11/22 (!) 164/88         Passed - Cr in normal range and within 180 days    Creat  Date Value Ref Range Status  08/11/2022 0.82 0.60 - 1.00 mg/dL Final         Passed - K in normal range and within 180 days     Potassium  Date Value Ref Range Status  08/11/2022 4.5 3.5 - 5.3 mmol/L Final         Passed - Na in normal range and within 180 days    Sodium  Date Value Ref Range Status  08/11/2022 141 135 - 146 mmol/L Final         Passed - Valid encounter within last 6 months    Recent Outpatient Visits           2 weeks ago Acquired hypothyroidism   [facility] [place], Salvadore Oxford, NP   3 months ago Abnormal TSH   [place] [facility] [place], Salvadore Oxford, NP   6 months ago Encounter for general adult medical examination with abnormal findings   [place] [facility] [place], Salvadore Oxford, NP   7 months ago No-show for appointment   [facility] [place], Salvadore Oxford, NP  11 months ago Mixed hyperlipidemia   [place] [facility] [place], Salvadore Oxford, [place]

## 2022-08-31 NOTE — Telephone Encounter (Signed)
Requested Prescriptions  Pending Prescriptions Disp Refills   rosuvastatin (CRESTOR) 20 MG tablet [Pharmacy Med Name: ROSUVASTATIN  TABLETS] 90 tablet 3    Sig: TAKE 1 TABLET(20 MG) BY MOUTH DAILY     Cardiovascular:  Antilipid - Statins 2 Failed - 08/31/2022  8:00 AM      Failed - Lipid Panel in normal range within the last 12 months    Cholesterol  Date Value Ref Range Status  08/11/2022 224 (H) <200 mg/dL Final   LDL Cholesterol (Calc)  Date Value Ref Range Status  08/11/2022 140 (H) mg/dL (calc) Final    Comment:    Reference range: <100 . Desirable range <100 mg/dL for primary prevention;   <70 mg/dL for patients with CHD or diabetic patients  with > or = 2 CHD risk factors. Marland Kitchen LDL-C is now calculated using the Martin-Hopkins  calculation, which is a validated novel method providing  better accuracy than the Friedewald equation in the  estimation of LDL-C.  Horald Pollen et al. Lenox Ahr. 1610;960(45): 2061-2068  (http://education.[employer].com/faq/FAQ164)    HDL  Date Value Ref Range Status  08/11/2022 62 > OR = 50 mg/dL Final   Triglycerides  Date Value Ref Range Status  08/11/2022 104 <150 mg/dL Final         Passed - Cr in normal range and within 360 days    Creat  Date Value Ref Range Status  08/11/2022 0.82 0.60 - 1.00 mg/dL Final         Passed - Patient is not pregnant      Passed - Valid encounter within last 12 months    Recent Outpatient Visits           2 weeks ago Acquired hypothyroidism   [place] [facility] [place], Salvadore Oxford, NP   3 months ago Abnormal TSH   [place] [facility] [place], Salvadore Oxford, NP   6 months ago Encounter for general adult medical examination with abnormal findings   [place] [facility] [place], Salvadore Oxford, NP   7 months ago No-show for appointment   [facility] [place], Salvadore Oxford, NP   11 months ago Mixed hyperlipidemia   [facility] [place], Salvadore Oxford, [place]

## 2022-09-06 ENCOUNTER — Other Ambulatory Visit: Payer: Self-pay | Admitting: Internal Medicine

## 2022-09-06 NOTE — Telephone Encounter (Signed)
Requested Prescriptions  Pending Prescriptions Disp Refills   olmesartan-hydrochlorothiazide (BENICAR HCT) 40-25 MG tablet 90 tablet 0    Sig: Take 1 tablet by mouth daily.     Cardiovascular: ARB + Diuretic Combos Failed - 09/06/2022  1:40 PM      Failed - Last BP in normal range    BP Readings from Last 1 Encounters:  08/11/22 (!) 164/88         Passed - K in normal range and within 180 days    Potassium  Date Value Ref Range Status  08/11/2022 4.5 3.5 - 5.3 mmol/L Final         Passed - Na in normal range and within 180 days    Sodium  Date Value Ref Range Status  08/11/2022 141 135 - 146 mmol/L Final         Passed - Cr in normal range and within 180 days    Creat  Date Value Ref Range Status  08/11/2022 0.82 0.60 - 1.00 mg/dL Final         Passed - eGFR is 10 or above and within 180 days    GFR calc Af Amer  Date Value Ref Range Status  01/01/2020 >60 >60 mL/min Final   GFR calc non Af Amer  Date Value Ref Range Status  01/01/2020 >60 >60 mL/min Final   GFR  Date Value Ref Range Status  07/29/2020 86.13 >60.00 mL/min Final    Comment:    Calculated using the CKD-EPI Creatinine Equation (2021)   eGFR  Date Value Ref Range Status  08/11/2022 77 > OR = 60 mL/min/1.60m2 Final         Passed - Patient is not pregnant      Passed - Valid encounter within last 6 months    Recent Outpatient Visits           3 weeks ago Acquired hypothyroidism   [place] [facility] [place], Salvadore Oxford, NP   3 months ago Abnormal TSH   [place] [facility] [place], Salvadore Oxford, NP   6 months ago Encounter for general adult medical examination with abnormal findings   [place] [facility] [place], Salvadore Oxford, NP   7 months ago No-show for appointment   [facility] [place], Salvadore Oxford, NP   11 months ago Mixed hyperlipidemia    [facility] [place], Salvadore Oxford, NP                losartan (COZAAR) 100 MG tablet 90 tablet 1    Sig: Take 1 tablet (100 mg total) by mouth daily.     Cardiovascular:  Angiotensin Receptor Blockers Failed - 09/06/2022  1:40 PM      Failed - Last BP in normal range    BP Readings from Last 1 Encounters:  08/11/22 (!) 164/88         Passed - Cr in normal range and within 180 days    Creat  Date Value Ref Range Status  08/11/2022 0.82 0.60 - 1.00 mg/dL Final         Passed - K in normal range and within 180 days    Potassium  Date Value Ref Range Status  08/11/2022 4.5 3.5 - 5.3 mmol/L Final         Passed - Patient is not pregnant      Passed - Valid encounter within last 6 months  Recent Outpatient Visits           3 weeks ago Acquired hypothyroidism   [place] [facility] [place], Salvadore Oxford, NP   3 months ago Abnormal TSH   [place] [facility], Salvadore Oxford, NP   6 months ago Encounter for general adult medical examination with abnormal findings   [place] [facility] [place], Salvadore Oxford, NP   7 months ago No-show for appointment   [facility] [place], NP   11 months ago Mixed hyperlipidemia   [facility] [place], Salvadore Oxford, [place]

## 2022-09-06 NOTE — Telephone Encounter (Signed)
Medication Refill - Medication: losartan (COZAAR) 100 MG tablet [[id number]] olmesartan-hydrochlorothiazide (BENICAR HCT) 40-25 MG tablet [[id number]]   Has the patient contacted their pharmacy? Yes.    (Agent: If yes, when and what did the pharmacy advise?) Contact PCP no refills left   Preferred Pharmacy (with phone number or street name):   [id number] - [place],  - [address]    Has the patient been seen for an appointment in the last year OR does the patient have an upcoming appointment? Yes.    Agent: Please be advised that RX refills may take up to 3 business days. We ask that you follow-up with your pharmacy.

## 2022-09-09 ENCOUNTER — Other Ambulatory Visit: Payer: Self-pay | Admitting: Internal Medicine

## 2022-09-11 ENCOUNTER — Ambulatory Visit: Payer: Self-pay

## 2022-09-11 NOTE — Telephone Encounter (Signed)
Chief Complaint: Unsure of which BP medication to take Symptoms: N/A Frequency: N/A Pertinent Negatives: Patient denies N/A Disposition: [] ED /[] Urgent Care (no appt availability in office) / [] Appointment(In office/virtual)/ []  [place] [facility]/ [x] Home Care/ [] Refused Recommended Disposition /[] [place] [facility]/ []  Follow-up with PCP Additional Notes: Patient called asking which BP medications she should be on. Advised Losartan was stopped and given amlodipine and olmesartan-HCTZ to start taking. She says she have both of those and will not take the losartan. Advised of f/u appointments needing to be scheduled, scheduled both f/u on Friday and CPE in September.   Summary: Medication Questions   Patient needs clarification on which blood pressure medication she should be taking. Patient states she was unaware of medication changes at last visit. Please advise.         Reason for Disposition  Caller has medicine question only, adult not sick, AND triager answers question  Answer Assessment - Initial Assessment Questions 1. NAME of MEDICINE: "What medicine(s) are you calling about?"    Losartan 2. QUESTION: "What is your question?" (e.g., double dose of medicine, side effect)     Should I take losartan 3. PRESCRIBER: "Who prescribed the medicine?" Reason: if prescribed by specialist, call should be referred to that group.     Nicki Reaper  Protocols used: Medication Question Call-A-AH

## 2022-09-11 NOTE — Telephone Encounter (Signed)
Unable to refill per protocol, Rx expired. Discontinued 09/30/21.  Requested Prescriptions  Pending Prescriptions Disp Refills   losartan (COZAAR) 100 MG tablet [Pharmacy Med Name: LOSARTAN 100MG  TABLETS] 90 tablet     Sig: TAKE 1 TABLET BY MOUTH EVERY DAY     Cardiovascular:  Angiotensin Receptor Blockers Failed - 09/09/2022 12:55 PM      Failed - Last BP in normal range    BP Readings from Last 1 Encounters:  08/11/22 (!) 164/88         Passed - Cr in normal range and within 180 days    Creat  Date Value Ref Range Status  08/11/2022 0.82 0.60 - 1.00 mg/dL Final         Passed - K in normal range and within 180 days    Potassium  Date Value Ref Range Status  08/11/2022 4.5 3.5 - 5.3 mmol/L Final         Passed - Patient is not pregnant      Passed - Valid encounter within last 6 months    Recent Outpatient Visits           1 month ago Acquired hypothyroidism   [place] [facility] [place], Salvadore Oxford, NP   3 months ago Abnormal TSH   [place] [facility] [place], [place], NP   7 months ago Encounter for general adult medical examination with abnormal findings   [place] [facility] [place], Salvadore Oxford, NP   8 months ago No-show for appointment   [facility] [place], Salvadore Oxford, NP   11 months ago Mixed hyperlipidemia   [place] [facility] [place], Salvadore Oxford, NP               hydrochlorothiazide (HYDRODIURIL) 25 MG tablet [Pharmacy Med Name: HYDROCHLOROTHIAZIDE 25MG  TABLETS] 90 tablet 0    Sig: TAKE 1 TABLET(25 MG) BY MOUTH DAILY     Cardiovascular: Diuretics - Thiazide Failed - 09/09/2022 12:55 PM      Failed - Last BP in normal range    BP Readings from Last 1 Encounters:  08/11/22 (!) 164/88         Passed - Cr in normal range and within 180 days    Creat  Date Value Ref Range Status  08/11/2022 0.82 0.60 - 1.00 mg/dL Final         Passed - K in normal range and  within 180 days    Potassium  Date Value Ref Range Status  08/11/2022 4.5 3.5 - 5.3 mmol/L Final         Passed - Na in normal range and within 180 days    Sodium  Date Value Ref Range Status  08/11/2022 141 135 - 146 mmol/L Final         Passed - Valid encounter within last 6 months    Recent Outpatient Visits           1 month ago Acquired hypothyroidism   [facility] [place], Salvadore Oxford, NP   3 months ago Abnormal TSH   [place] [facility] [place], Salvadore Oxford, NP   7 months ago Encounter for general adult medical examination with abnormal findings   [place] [facility] [place], Salvadore Oxford, NP   8 months ago No-show for appointment   [facility], Salvadore Oxford, NP  11 months ago Mixed hyperlipidemia   [facility] [place], Salvadore [place], [place]

## 2022-09-15 ENCOUNTER — Encounter: Payer: Self-pay | Admitting: Internal Medicine

## 2022-09-15 ENCOUNTER — Ambulatory Visit (INDEPENDENT_AMBULATORY_CARE_PROVIDER_SITE_OTHER): Payer: Medicare PPO | Admitting: Internal Medicine

## 2022-09-15 VITALS — BP 130/72 | HR 74 | Temp 96.9°F | Wt 99.0 lb

## 2022-09-15 DIAGNOSIS — I10 Essential (primary) hypertension: Secondary | ICD-10-CM | POA: Diagnosis not present

## 2022-09-15 DIAGNOSIS — F32A Depression, unspecified: Secondary | ICD-10-CM

## 2022-09-15 DIAGNOSIS — F419 Anxiety disorder, unspecified: Secondary | ICD-10-CM | POA: Diagnosis not present

## 2022-09-15 MED ORDER — MIRTAZAPINE 15 MG PO TABS: 15.0000 mg | ORAL_TABLET | Freq: Every day | ORAL | 1 refills | Status: DC

## 2022-09-15 NOTE — Assessment & Plan Note (Signed)
Controlled on amlodipine and olmesartan HCT Reinforced DASH diet Will continue to monitor

## 2022-09-15 NOTE — Patient Instructions (Signed)
Cooking With Less Salt Cooking with less salt is one way to reduce the amount of sodium you get from food. Sodium is one of the elements that make up salt. It is found naturally in foods and is also added to certain foods. Depending on your condition and overall health, your health care provider or dietitian may recommend that you reduce your sodium intake. Most people should have less than 2,300 milligrams (mg) of sodium each day. If you have high blood pressure (hypertension), you may need to limit your sodium to 1,500 mg each day. Follow the tips below to help reduce your sodium intake. What are tips for eating less sodium? Reading food labels  Check the food label before buying or using packaged ingredients. Always check the label for the serving size and sodium content. Look for products with no more than 140 mg of sodium in one serving. Check the % Daily Value column to see what percent of the daily recommended amount of sodium is provided in one serving of the product. Foods with 5% or less in this column are considered low in sodium. Foods with 20% or higher are considered high in sodium. Do not choose foods with salt as one of the first three ingredients on the ingredients list. If salt is one of the first three ingredients, it usually means the item is high in sodium. Shopping Buy sodium-free or low-sodium products. Look for the following words on food labels: Low-sodium. Sodium-free. Reduced-sodium. No salt added. Unsalted. Always check the sodium content even if foods are labeled as low-sodium or no salt added. Buy fresh foods. Cooking Use herbs, seasonings without salt, and spices as substitutes for salt. Use sodium-free baking soda when baking. Grill, braise, or roast foods to add flavor with less salt. Avoid adding salt to pasta, rice, or hot cereals. Drain and rinse canned vegetables, beans, and meat before use. Avoid adding salt when cooking sweets and desserts. Cook with  low-sodium ingredients. What foods are high in sodium? Vegetables Regular canned vegetables (not low-sodium or reduced-sodium). Sauerkraut, pickled vegetables, and relishes. Olives. French fries. Onion rings. Regular canned tomato sauce and paste. Regular tomato and vegetable juice. Frozen vegetables in sauces. Grains Instant hot cereals. Bread stuffing, pancake, and biscuit mixes. Croutons. Seasoned rice or pasta mixes. Noodle soup cups. Boxed or frozen macaroni and cheese. Regular salted crackers. Self-rising flour. Rolls. Bagels. Flour tortillas and wraps. Meats and other proteins Meat or fish that is salted, canned, smoked, cured, spiced, or pickled. This includes bacon, ham, sausages, hot dogs, corned beef, chipped beef, meat loaves, salt pork, jerky, pickled herring, anchovies, regular canned tuna, and sardines. Salted nuts. Dairy Processed cheese and cheese spreads. Cheese curds. Blue cheese. Feta cheese. String cheese. Regular cottage cheese. Buttermilk. Canned milk. The items listed above may not be a complete list of foods high in sodium. Actual amounts of sodium may be different depending on processing. Contact a dietitian for more information. What foods are low in sodium? Fruits Fresh, frozen, or canned fruit with no sauce added. Fruit juice. Vegetables Fresh or frozen vegetables with no sauce added. "No salt added" canned vegetables. "No salt added" tomato sauce and paste. Low-sodium or reduced-sodium tomato and vegetable juice. Grains Noodles, pasta, quinoa, rice. Shredded or puffed wheat or puffed rice. Regular or quick oats (not instant). Low-sodium crackers. Low-sodium bread. Whole-grain bread and whole-grain pasta. Unsalted popcorn. Meats and other proteins Fresh or frozen whole meats, poultry (not injected with sodium), and fish with no sauce added.   Unsalted nuts. Dried peas, beans, and lentils without added salt. Unsalted canned beans. Eggs. Unsalted nut butters. Low-sodium  canned tuna or chicken. Dairy Milk. Soy milk. Yogurt. Low-sodium cheeses, such as Swiss, Monterey Jack, mozzarella, and ricotta. Sherbet or ice cream (keep to  cup per serving). Cream cheese. Fats and oils Unsalted butter or margarine. Other foods Homemade pudding. Sodium-free baking soda and baking powder. Herbs and spices. Low-sodium seasoning mixes. Beverages Coffee and tea. Carbonated beverages. The items listed above may not be a complete list of foods low in sodium. Actual amounts of sodium may be different depending on processing. Contact a dietitian for more information. What are some salt alternatives when cooking? The following are herbs, seasonings, and spices that can be used instead of salt to flavor your food. Herbs should be fresh or dried. Do not choose packaged mixes. Next to the name of the herb, spice, or seasoning are some examples of foods you can pair it with. Herbs Bay leaves - Soups, meat and vegetable dishes, and spaghetti sauce. Basil - Italian dishes, soups, pasta, and fish dishes. Cilantro - Meat, poultry, and vegetable dishes. Chili powder - Marinades and Mexican dishes. Chives - Salad dressings and potato dishes. Cumin - Mexican dishes, couscous, and meat dishes. Dill - Fish dishes, sauces, and salads. Fennel - Meat and vegetable dishes, breads, and cookies. Garlic (do not use garlic salt) - Italian dishes, meat dishes, salad dressings, and sauces. Marjoram - Soups, potato dishes, and meat dishes. Oregano - Pizza and spaghetti sauce. Parsley - Salads, soups, pasta, and meat dishes. Rosemary - Italian dishes, salad dressings, soups, and red meats. 

## 2022-09-15 NOTE — Progress Notes (Signed)
Subjective:    Patient ID: [name] [name], female    DOB: November 25, 1950, 72 y.o.   MRN: [id number]  HPI  Patient presents to clinic today for follow-up of HTN.  At her last visit, her Losartan HCT was changed to Olmesartan HCT and Amlodipine was added.  She has been taking the medication as prescribed.  Her BP today is 130/72.  ECG from 10/2017 reviewed. She has been under a lot of stress lately due to marital stress. She is not sleeping well. She is feeling more depressed. She is taking Mirtazapine and Buspirone. She is not currently seeing a therapist but has in the past.   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 90 tablet 0   aspirin 81 MG EC tablet Take 1 tablet (81 mg total) by mouth daily. Swallow whole. 30 tablet 12   busPIRone (BUSPAR) 5 MG tablet TAKE 1 TABLET(5 MG) BY MOUTH THREE TIMES DAILY 270 tablet 0   Cholecalciferol (VITAMIN D3) 50 MCG (2000 UT) TABS Take 1 tablet by mouth daily.     Cyanocobalamin (B-12) 5000 MCG CAPS Take 1 capsule by mouth daily.     levothyroxine (SYNTHROID) 25 MCG tablet Take 1 tablet (25 mcg total) by mouth daily. (Patient not taking: Reported on 08/11/2022) 90 tablet 0   memantine (NAMENDA) 10 MG tablet Take 1 tablet (10 mg total) by mouth 2 (two) times daily. 180 tablet 3   mirtazapine (REMERON) 7.5 MG tablet Take 1 tablet (7.5 mg total) by mouth at bedtime. 30 tablet 2   Naltrexone (VIVITROL) 380 MG SUSR Inject into  the muscle.     olmesartan-hydrochlorothiazide (BENICAR HCT) 40-25 MG tablet Take 1 tablet by mouth daily. 90 tablet 0   rosuvastatin (CRESTOR) 20 MG tablet TAKE 1 TABLET(20 MG) BY MOUTH DAILY 90 tablet 3   traMADol (ULTRAM) 50 MG tablet Take 1 tablet (50 mg total) by mouth every 8 (eight) hours as needed. for pain 30 tablet 0   No current facility-administered medications for this visit.    Allergies  Allergen Reactions   Erythromycin Other (See Comments)    REACTION: GI upset   Penicillins Nausea And Vomiting    Family History  Problem Relation Age of Onset   Diabetes Mother    Stroke Mother    Other Mother        colon sugery   Polymyalgia rheumatica Mother    Memory loss Mother        late 13s   Alcoholism Mother    Memory loss Father    Heart disease Father    Diabetes Father    Dementia Father        unspecified type; late 70s/early 33s   Colon cancer Neg Hx    Cancer Neg Hx    Esophageal cancer Neg Hx    Pancreatic cancer Neg Hx    Stomach cancer Neg Hx     Social History  Socioeconomic History   Marital status: Married    Spouse name: Not on file   Number of children: 2   Years of education: 16   Highest education level: Bachelor's degree (e.g., BA, AB, BS)  Occupational History   Occupation: Retired    Comment: Engineer, site  Tobacco Use   Smoking status: Every Day    Packs/day: 1    Types: Cigarettes   Smokeless tobacco: Never  Vaping Use   Vaping Use: Former  Substance and Sexual Activity   Alcohol use: Not Currently    Comment: occ   Drug use: No   Sexual activity: Not Currently    Birth control/protection: Post-menopausal  Other Topics Concern   Not on file  Social History Narrative   Not on file   Social Determinants of Health   Financial Resource Strain: Not on file  Food Insecurity: Not on file  Transportation Needs: Not on file  Physical Activity: Not on file  Stress: Not on file  Social Connections: Not on file  Intimate  Partner Violence: Not on file     Constitutional: Denies fever, malaise, fatigue, headache or abrupt weight changes.  HEENT: Denies eye pain, eye redness, ear pain, ringing in the ears, wax buildup, runny nose, nasal congestion, bloody nose, or sore throat. Respiratory: Denies difficulty breathing, shortness of breath, cough or sputum production.   Cardiovascular: Denies chest pain, chest tightness, palpitations or swelling in the hands or feet.  Gastrointestinal: Patient reports intermittent diarrhea.  Denies abdominal pain, bloating, constipation, or blood in the stool.  GU: Denies urgency, frequency, pain with urination, burning sensation, blood in urine, odor or discharge. Musculoskeletal: Patient reports chronic low back/tailbone pain.  Denies decrease in range of motion, difficulty with gait, muscle pain or joint swelling.  Skin: Denies redness, rashes, lesions or ulcercations.  Neurological: Patient reports difficulty with memory.  Denies dizziness, difficulty with speech or problems with balance and coordination.  Psych: Patient has a history of anxiety and depression.  Denies SI/HI.  No other specific complaints in a complete review of systems (except as listed in HPI above).  Objective:   Physical Exam  BP 130/72 (BP Location: Left Arm, Patient Position: Sitting, Cuff Size: Normal)   Pulse 74   Temp (!) 96.9 F (36.1 C) (Temporal)   Wt 99 lb (44.9 kg)   SpO2 98%   BMI 20.00 kg/m   Wt Readings from Last 3 Encounters:  08/11/22 100 lb (45.4 kg)  05/22/22 101 lb (45.8 kg)  04/20/22 102 lb (46.3 kg)    General: Appears her stated age, well developed, well nourished in NAD. Skin: Warm, dry and intact.  HEENT: Head: normal shape and size; Eyes: sclera white, no icterus, conjunctiva pink, PERRLA and EOMs intact;  Cardiovascular: Normal rate and rhythm. S1,S2 noted.  No murmur, rubs or gallops noted. No JVD or BLE edema. Pulmonary/Chest: Normal effort and positive vesicular  breath sounds. No respiratory distress. No wheezes, rales or ronchi noted.  Musculoskeletal: No difficulty with gait.  Neurological: Alert and oriented. Coordination normal.  Psychiatric: Mood and affect mildly flat. Tearful. Judgment and thought content normal.    BMET    Component Value Date/Time   NA 141 08/11/2022 1351   K 4.5 08/11/2022 1351   CL 104 08/11/2022 1351   CO2 28 08/11/2022 1351   GLUCOSE 95 08/11/2022 1351   BUN 24 08/11/2022 1351   CREATININE 0.82 08/11/2022 1351   CALCIUM 9.9 08/11/2022 1351   GFRNONAA >60 01/01/2020  0405   GFRAA >60 01/01/2020 0405    Lipid Panel     Component Value Date/Time   CHOL 224 (H) 08/11/2022 1351   TRIG 104 08/11/2022 1351   HDL 62 08/11/2022 1351   CHOLHDL 3.6 08/11/2022 1351   VLDL 33.8 05/13/2019 1228   LDLCALC 140 (H) 08/11/2022 1351    CBC    Component Value Date/Time   WBC 7.2 08/11/2022 1351   RBC 4.02 08/11/2022 1351   HGB 13.1 08/11/2022 1351   HCT 38.6 08/11/2022 1351   PLT 341 08/11/2022 1351   MCV 96.0 08/11/2022 1351   MCH 32.6 08/11/2022 1351   MCHC 33.9 08/11/2022 1351   RDW 13.2 08/11/2022 1351   LYMPHSABS 1.5 12/31/2019 0904   MONOABS 1.0 12/31/2019 0904   EOSABS 0.1 12/31/2019 0904   BASOSABS 0.0 12/31/2019 0904    Hgb A1C Lab Results  Component Value Date   HGBA1C 5.5 08/11/2022           Assessment & Plan:     RTC in 4 months for annual exam Nicki Reaper, NP

## 2022-09-15 NOTE — Assessment & Plan Note (Signed)
Deteriorated Increase mirtazapine to 15 mg at bedtime She declines referral for therapy at this time Support offered

## 2022-09-25 ENCOUNTER — Telehealth: Payer: Self-pay | Admitting: Internal Medicine

## 2022-09-25 NOTE — Telephone Encounter (Signed)
Called patient to schedule Medicare Annual Wellness Visit (AWV). No voicemail available to leave a message.  Last date of AWV: 01/04/21  Please schedule an appointment at any time with Kennedy Bucker, LPN  .  If any questions, please contact me.  Thank you ,  Verlee Rossetti; Care Guide Ambulatory Clinical Support [place] [facility] Direct Dial: [phone]

## 2022-11-06 ENCOUNTER — Other Ambulatory Visit: Payer: Self-pay | Admitting: Internal Medicine

## 2022-11-07 ENCOUNTER — Telehealth: Payer: Self-pay

## 2022-11-07 ENCOUNTER — Other Ambulatory Visit: Payer: Self-pay | Admitting: Internal Medicine

## 2022-11-07 MED ORDER — MIRTAZAPINE 15 MG PO TABS: 15.0000 mg | ORAL_TABLET | Freq: Every day | ORAL | 0 refills | Status: DC

## 2022-11-07 MED ORDER — ROSUVASTATIN CALCIUM 20 MG PO TABS: ORAL_TABLET | ORAL | 0 refills | Status: DC

## 2022-11-07 MED ORDER — AMLODIPINE BESYLATE 10 MG PO TABS: ORAL_TABLET | ORAL | 0 refills | Status: DC

## 2022-11-07 MED ORDER — OLMESARTAN MEDOXOMIL-HCTZ 40-25 MG PO TABS: 1.0000 | ORAL_TABLET | Freq: Every day | ORAL | 0 refills | Status: DC

## 2022-11-07 MED ORDER — LEVOTHYROXINE SODIUM 25 MCG PO TABS: 25.0000 ug | ORAL_TABLET | Freq: Every day | ORAL | 0 refills | Status: DC

## 2022-11-07 MED ORDER — MEMANTINE HCL 10 MG PO TABS: 10.0000 mg | ORAL_TABLET | Freq: Two times a day (BID) | ORAL | 0 refills | Status: DC

## 2022-11-07 MED ORDER — BUSPIRONE HCL 5 MG PO TABS: ORAL_TABLET | ORAL | 0 refills | Status: DC

## 2022-11-07 NOTE — Telephone Encounter (Signed)
Requested medication (s) are due for refill today - yes  Requested medication (s) are on the active medication list -yes  Future visit scheduled -yes  Last refill: 05/22/22 #30  Notes to clinic: non delegated Rx  Requested Prescriptions  Pending Prescriptions Disp Refills   traMADol (ULTRAM) 50 MG tablet 30 tablet 0    Sig: Take 1 tablet (50 mg total) by mouth every 8 (eight) hours as needed. for pain     Not Delegated - Analgesics:  Opioid Agonists Failed - 11/07/2022  1:18 PM      Failed - This refill cannot be delegated      Failed - Urine Drug Screen completed in last 360 days      Passed - Valid encounter within last 3 months    Recent Outpatient Visits           1 month ago Essential hypertension   [place] [facility] [place], Salvadore Oxford, NP   2 months ago Acquired hypothyroidism   [facility] [place], Salvadore Oxford, NP   5 months ago Abnormal TSH   [facility] [place], Salvadore Oxford, NP   9 months ago Encounter for general adult medical examination with abnormal findings   [place] [facility] [place], Salvadore Oxford, NP   10 months ago No-show for appointment   [facility] [place], Salvadore Oxford, NP       Future Appointments             In 2 months [place], Salvadore Oxford, NP [facility], [facility]               Requested Prescriptions  Pending Prescriptions Disp Refills   traMADol (ULTRAM) 50 MG tablet 30 tablet 0    Sig: Take 1 tablet (50 mg total) by mouth every 8 (eight) hours as needed. for pain     Not Delegated - Analgesics:  Opioid Agonists Failed - 11/07/2022  1:18 PM      Failed - This refill cannot be delegated      Failed - Urine Drug Screen completed in last 360 days      Passed - Valid encounter within last 3 months    Recent Outpatient Visits           1 month ago Essential hypertension   [place] [facility]  [place], Salvadore Oxford, NP   2 months ago Acquired hypothyroidism   [place] [facility] [place], Salvadore Oxford, NP   5 months ago Abnormal TSH   [place] [facility] [place], Salvadore Oxford, NP   9 months ago Encounter for general adult medical examination with abnormal findings   [place] [facility] [place], Salvadore Oxford, NP   10 months ago No-show for appointment   [place] [place], Salvadore Oxford, NP       Future Appointments             In 2 months [place], Salvadore Oxford, NP [facility], [facility]

## 2022-11-07 NOTE — Telephone Encounter (Signed)
Tried calling; pt's voicemail is full.  PEC please advise pt if she calls back.   Thanks,   -Mckaylee Dimalanta  

## 2022-11-07 NOTE — Telephone Encounter (Signed)
Medication Refill - Medication: ALL MEDICATIONS from Nicki Reaper, NP  Has the patient contacted their pharmacy? Yes.   Patient stated she was on a trip in [place] and accidentally left ALL medications in [place] and did not know if she could get a week supply of medication to cover this until she gets her medications shipped back to her. Patient is experiencing memory loss she expressed.  Preferred Pharmacy (with phone number or street name): [id number] [place], [place] - [address] AT [facility] OF [address]  Phone: [phone] Fax: [phone]   Has the patient been seen for an appointment in the last year OR does the patient have an upcoming appointment? Yes.  , has a CPE scheduled on 01/18/2023

## 2022-11-07 NOTE — Telephone Encounter (Signed)
Spoke with patient today in separate encounter requesting a refill on all medications due to leaving them in [place]. Reviewed all medications with patient and patient stated she was not sure if she was taking the levothyroxine Rx. Advised that I would send to Regina's office for any further recommendations.

## 2022-11-07 NOTE — Telephone Encounter (Signed)
She is seeing neurology for evaluation of her memory loss.  I would recommend that she follow-up with them

## 2022-11-07 NOTE — Telephone Encounter (Signed)
I have sent all of her medications to the pharmacy for her.

## 2022-11-07 NOTE — Telephone Encounter (Signed)
Requested Prescriptions  Pending Prescriptions Disp Refills   amLODipine (NORVASC) 10 MG tablet [Pharmacy Med Name: AMLODIPINE BESYLATE 10MG  TABLETS] 90 tablet 0    Sig: TAKE 1 TABLET(10 MG) BY MOUTH DAILY     Cardiovascular: Calcium Channel Blockers 2 Passed - 11/06/2022  3:41 AM      Passed - Last BP in normal range    BP Readings from Last 1 Encounters:  09/15/22 130/72         Passed - Last Heart Rate in normal range    Pulse Readings from Last 1 Encounters:  09/15/22 74         Passed - Valid encounter within last 6 months    Recent Outpatient Visits           1 month ago Essential hypertension   [place] [facility] [place], [place], NP   2 months ago Acquired hypothyroidism   [place] [place], Salvadore Oxford, NP   5 months ago Abnormal TSH   [place] [facility] [place], [place], NP   9 months ago Encounter for general adult medical examination with abnormal findings   [place] [facility] [place], Salvadore Oxford, NP   10 months ago No-show for appointment   [facility] [place], Salvadore Oxford, NP       Future Appointments             In 2 months [place], Salvadore Oxford, NP [place] [facility], [facility]             olmesartan-hydrochlorothiazide (BENICAR HCT) 40-25 MG tablet [Pharmacy Med Name: OLMESARTAN MEDOX/HCTZ 40-25MG  TAB] 90 tablet 0    Sig: TAKE 1 TABLET BY MOUTH DAILY     Cardiovascular: ARB + Diuretic Combos Passed - 11/06/2022  3:41 AM      Passed - K in normal range and within 180 days    Potassium  Date Value Ref Range Status  08/11/2022 4.5 3.5 - 5.3 mmol/L Final         Passed - Na in normal range and within 180 days    Sodium  Date Value Ref Range Status  08/11/2022 141 135 - 146 mmol/L Final         Passed - Cr in normal range and within 180 days    Creat  Date Value Ref Range Status  08/11/2022 0.82 0.60 - 1.00 mg/dL Final          Passed - eGFR is 10 or above and within 180 days    GFR calc Af Amer  Date Value Ref Range Status  01/01/2020 >60 >60 mL/min Final   GFR calc non Af Amer  Date Value Ref Range Status  01/01/2020 >60 >60 mL/min Final   GFR  Date Value Ref Range Status  07/29/2020 86.13 >60.00 mL/min Final    Comment:    Calculated using the CKD-EPI Creatinine Equation (2021)   eGFR  Date Value Ref Range Status  08/11/2022 77 > OR = 60 mL/min/1.45m2 Final         Passed - Patient is not pregnant      Passed - Last BP in normal range    BP Readings from Last 1 Encounters:  09/15/22 130/72         Passed - Valid encounter within last 6 months    Recent Outpatient Visits  1 month ago Essential hypertension   [facility] [place], Salvadore Oxford, NP   2 months ago Acquired hypothyroidism   [place] [facility] [place], Salvadore Oxford, NP   5 months ago Abnormal TSH   [facility] [place], Salvadore Oxford, NP   9 months ago Encounter for general adult medical examination with abnormal findings   [facility] [place], Salvadore Oxford, NP   10 months ago No-show for appointment   [facility] [place], Salvadore Oxford, NP       Future Appointments             In 2 months Ba[place], Salvadore Oxford, NP [place] [facility] [place], [place]

## 2022-11-07 NOTE — Telephone Encounter (Signed)
Copied from CRM [id number]. Topic: Appointment Scheduling - Scheduling Inquiry for Clinic >> Nov 07, 2022 12:13 PM Lennox Pippins wrote: Patient called in and stated she left all of her medications in [place] while visiting a relative (send a med refill to Nurse Triage for this) and stated that she has been experiencing memory loss that Nicki Reaper is aware of and she did not know if it's due to her memory loss that she left her medications and she was inquiring if Rene Kocher thought it would be best to schedule a f/u appointment on her memory loss situation?

## 2022-11-07 NOTE — Telephone Encounter (Signed)
Called Patient to verify medications needed to be sent to the pharmacy due to patient leaving the medicines in [place]. Went through medication list and she stated which medications she needed. She was not sure if she was still taking levothyroxine as ordered. Advised patient that mirtazapine, amlodipine, and rosuvastatin is at the pharmacy and to call to verify. Pharmacy number was given.

## 2022-11-08 MED ORDER — TRAMADOL HCL 50 MG PO TABS: 50.0000 mg | ORAL_TABLET | Freq: Three times a day (TID) | ORAL | 0 refills | Status: DC | PRN

## 2022-11-21 ENCOUNTER — Ambulatory Visit: Payer: Self-pay

## 2022-11-21 NOTE — Telephone Encounter (Signed)
     Chief Complaint: Diarrhea, "husband states she "can't go far from the bathroom." He is currently not with pt. Called pt. And left a message to call back. Symptoms: Above Frequency: Yesterday Pertinent Negatives: Patient denies vomiting Disposition: [] ED /[] Urgent Care (no appt availability in office) / [x] Appointment(In office/virtual)/ []  [facility]/ [] Home Care/ [] Refused Recommended Disposition /[] [facility]/ []  Follow-up with PCP Additional Notes: Instructed husband pt. Should go to ED for worsening of symptoms.  Reason for Disposition  [1] SEVERE diarrhea (e.g., 7 or more times / day more than normal) AND [2] present > 24 hours (1 day)  Answer Assessment - Initial Assessment Questions 1. DIARRHEA SEVERITY: "How bad is the diarrhea?" "How many more stools have you had in the past 24 hours than normal?"    - NO DIARRHEA (SCALE 0)   - MILD (SCALE 1-3): Few loose or mushy BMs; increase of 1-3 stools over normal daily number of stools; mild increase in ostomy output.   -  MODERATE (SCALE 4-7): Increase of 4-6 stools daily over normal; moderate increase in ostomy output.   -  SEVERE (SCALE 8-10; OR "WORST POSSIBLE"): Increase of 7 or more stools daily over normal; moderate increase in ostomy output; incontinence.     Moderate 2. ONSET: "When did the diarrhea begin?"      Yesterday 3. BM CONSISTENCY: "How loose or watery is the diarrhea?"      Husband unsure 4. VOMITING: "Are you also vomiting?" If Yes, ask: "How many times in the past 24 hours?"      No 5. ABDOMEN PAIN: "Are you having any abdomen pain?" If Yes, ask: "What does it feel like?" (e.g., crampy, dull, intermittent, constant)      No 6. ABDOMEN PAIN SEVERITY: If present, ask: "How bad is the pain?"  (e.g., Scale 1-10; mild, moderate, or severe)   - MILD (1-3): doesn't interfere with normal activities, abdomen soft and not tender to touch    - MODERATE (4-7): interferes with normal activities or  awakens from sleep, abdomen tender to touch    - SEVERE (8-10): excruciating pain, doubled over, unable to do any normal activities       No 7. ORAL INTAKE: If vomiting, "Have you been able to drink liquids?" "How much liquids have you had in the past 24 hours?"     "Not much." 8. HYDRATION: "Any signs of dehydration?" (e.g., dry mouth [not just dry lips], too weak to stand, dizziness, new weight loss) "When did you last urinate?"     No 9. EXPOSURE: "Have you traveled to a foreign country recently?" "Have you been exposed to anyone with diarrhea?" "Could you have eaten any food that was spoiled?"     No 10. ANTIBIOTIC USE: "Are you taking antibiotics now or have you taken antibiotics in the past 2 months?"       No 11. OTHER SYMPTOMS: "Do you have any other symptoms?" (e.g., fever, blood in stool)       No 12. PREGNANCY: "Is there any chance you are pregnant?" "When was your last menstrual period?"       No  Protocols used: [facility]

## 2022-11-21 NOTE — Telephone Encounter (Signed)
Pt. Called back. Drinking water and tea. Diarrhea is watery. Has been "going on awhile." Has appointment for 11/22/22.States "I feel pretty good, but I've lost a lot of weight." Will go to ED for worsening of symptoms.

## 2022-11-22 ENCOUNTER — Telehealth (INDEPENDENT_AMBULATORY_CARE_PROVIDER_SITE_OTHER): Payer: Medicare HMO | Admitting: Internal Medicine

## 2022-11-22 ENCOUNTER — Ambulatory Visit: Payer: Medicare HMO | Admitting: Neurology

## 2022-11-22 ENCOUNTER — Encounter: Payer: Self-pay | Admitting: Internal Medicine

## 2022-11-22 VITALS — BP 136/74 | HR 69 | Temp 96.6°F | Wt 98.0 lb

## 2022-11-22 DIAGNOSIS — G3184 Mild cognitive impairment, so stated: Secondary | ICD-10-CM | POA: Diagnosis not present

## 2022-11-22 DIAGNOSIS — E039 Hypothyroidism, unspecified: Secondary | ICD-10-CM | POA: Diagnosis not present

## 2022-11-22 DIAGNOSIS — F5105 Insomnia due to other mental disorder: Secondary | ICD-10-CM | POA: Diagnosis not present

## 2022-11-22 DIAGNOSIS — F32A Depression, unspecified: Secondary | ICD-10-CM | POA: Diagnosis not present

## 2022-11-22 DIAGNOSIS — F419 Anxiety disorder, unspecified: Secondary | ICD-10-CM

## 2022-11-22 DIAGNOSIS — F1011 Alcohol abuse, in remission: Secondary | ICD-10-CM | POA: Diagnosis not present

## 2022-11-22 DIAGNOSIS — F99 Mental disorder, not otherwise specified: Secondary | ICD-10-CM

## 2022-11-22 DIAGNOSIS — E559 Vitamin D deficiency, unspecified: Secondary | ICD-10-CM | POA: Diagnosis not present

## 2022-11-22 DIAGNOSIS — G4700 Insomnia, unspecified: Secondary | ICD-10-CM | POA: Insufficient documentation

## 2022-11-22 MED ORDER — MIRTAZAPINE 30 MG PO TBDP
30.0000 mg | ORAL_TABLET | Freq: Every day | ORAL | 0 refills | Status: DC
Start: 2022-11-22 — End: 2022-12-20

## 2022-11-22 NOTE — Assessment & Plan Note (Signed)
TSH and free T4 today 

## 2022-11-22 NOTE — Assessment & Plan Note (Addendum)
Deteriorated Increase mirtazapine to 30 mg at bedtime Continue buspirone as previously prescribed Will have her husband call tried mental health for further evaluation

## 2022-11-22 NOTE — Assessment & Plan Note (Addendum)
She reports that she is sober but her husband questions this

## 2022-11-22 NOTE — Assessment & Plan Note (Addendum)
Deteriorated Advised her husband to call neurology and schedule a follow-up appointment As needed continue memantine as previously prescribed We will check CBC, c-Met, vitamin D and B12

## 2022-11-22 NOTE — Progress Notes (Signed)
Subjective:    Patient ID: [name] [name], female    DOB: [birth year], 72 y.o.   MRN: [id number]  HPI  Patient presents the clinic today for follow-up of anxiety, depression and cognitive issues. This is a chronic issue that has seemed worse lately since her husband separated from her in the last year. She has had unintentional weight loss of 4 lbs in the last 6 months despite use of mirtazapine for appetite stimulation and sleep.  Her husband reports she is a chain smoker and prefers to smoke instead of heat.  She reports she is not ready to stop smoking.  She reports she is not sleeping well. She is up and down all hours of the night. She does not nap during the day.  She is using a sleep aid OTC in addition to the mirtazapine but does not know the name of it.  She does feel like she is more depressed and anxious than usual. She is having difficulty focusing. She takes buspirone as needed.  She currently lives independently. She is able to clean the home, fix her meals. Her husband manages her bills but she thinks that she would not be able to do this if she had to. She went to inpatient rehab for alcohol use 12/13-12/31/23. She has a history of MCI, felt to be related to anxiety, depression and alcohol use. She is taking memantine as prescribed. Neurology referred her to neuropsych 04/2022 but she never followed through with this appt. She reports she is currently sober, but her husband reports she has had some wine at least once in the last week or two. Her husband is unsure if she is actually taking her medications. She denies SI/HI but her husband reports that he is concerned for her safety.   MRI brain from 2022 showed:  IMPRESSION:   This MRI of the brain with and without contrast shows the following: 1.  Mild generalized cortical atrophy, mildly progressed compared to the 03/22/2019 MRI. 2.  Some scattered punctate T2/FLAIR hyperintense foci in the hemispheres consistent with minimal chronic  microvascular ischemic change, stable compared to the 2020 MRI. 3.  No acute findings. 4.  Normal enhancement pattern.   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 Swallow whole. 30 tablet 12   busPIRone (BUSPAR) 5 MG tablet TAKE 1 TABLET(5 MG) BY MOUTH THREE TIMES DAILY 270 tablet 0   Cholecalciferol (VITAMIN D3) 50 MCG (2000 UT) TABS Take 1 tablet by mouth daily.     Cyanocobalamin (B-12) 5000 MCG CAPS Take 1 capsule by mouth daily.     levothyroxine (SYNTHROID) 25 MCG tablet Take 1 tablet (25 mcg total) by mouth daily. 90 tablet 0   memantine (NAMENDA) 10 MG tablet Take 1 tablet (10 mg total) by mouth 2 (two) times daily. 180 tablet 0   mirtazapine (  REMERON) 15 MG tablet Take 1 tablet (15 mg total) by mouth at bedtime. 90 tablet 0   Naltrexone (VIVITROL) 380 MG SUSR Inject into the muscle.     olmesartan-hydrochlorothiazide (BENICAR HCT) 40-25 MG tablet Take 1 tablet by mouth daily. 90 tablet 0   rosuvastatin (CRESTOR) 20 MG tablet TAKE 1 TABLET(20 MG) BY MOUTH DAILY 90 tablet 0   traMADol (ULTRAM) 50 MG tablet Take 1 tablet (50 mg total) by mouth every 8 (eight) hours as needed. for pain 30 tablet 0   No current facility-administered medications for this visit.    Allergies  Allergen Reactions   Erythromycin Other (See Comments)    REACTION: GI upset   Penicillins  Nausea And Vomiting    Family History  Problem Relation Age of Onset   Diabetes Mother    Stroke Mother    Other Mother        colon sugery   Polymyalgia rheumatica Mother    Memory loss Mother        late 87s   Alcoholism Mother    Memory loss Father    Heart disease Father    Diabetes Father    Dementia Father        unspecified type; late 70s/early 89s   Colon cancer Neg Hx    Cancer Neg Hx    Esophageal cancer Neg Hx    Pancreatic cancer Neg Hx    Stomach cancer Neg Hx     Social History   Socioeconomic History   Marital status: Married    Spouse name: Not on file   Number of children: 2   Years of education: 16   Highest education level: Bachelor's degree (e.g., BA, AB, BS)  Occupational History   Occupation: Retired    Comment: Engineer, site  Tobacco Use   Smoking status: Every Day    Packs/day: 1    Types: Cigarettes   Smokeless tobacco: Never  Vaping Use   Vaping Use: Former  Substance and Sexual Activity   Alcohol use: Not Currently    Comment: occ   Drug use: No   Sexual activity: Not Currently    Birth control/protection: Post-menopausal  Other Topics Concern   Not on file  Social History Narrative   Not on file   Social Determinants of Health   Financial Resource Strain: Not on file  Food Insecurity: Not on file  Transportation Needs: Not on file  Physical Activity: Not on file  Stress: Not on file  Social Connections: Not on file  Intimate Partner Violence: Not on file     Constitutional: Patient reports unintentional weight loss.  Denies fever, malaise, fatigue, headache.  HEENT: Denies eye pain, eye redness, ear pain, ringing in the ears, wax buildup, runny nose, nasal congestion, bloody nose, or sore throat. Respiratory: Denies difficulty breathing, shortness of breath, cough or sputum production.   Cardiovascular: Denies chest pain, chest tightness, palpitations or swelling in the hands or feet.  Gastrointestinal: Patient reports  chronic diarrhea.  Denies abdominal pain, bloating, constipation, or blood in the stool.  GU: Denies urgency, frequency, pain with urination, burning sensation, blood in urine, odor or discharge. Musculoskeletal: Patient reports joint pain.  Denies decrease in range of motion, difficulty with gait, muscle pain or joint swelling.  Skin: Denies redness, rashes, lesions or ulcercations.  Neurological: Patient reports insomnia, difficulty with memory.  Denies dizziness, difficulty with speech or problems with balance and coordination.  Psych: Patient has a history of anxiety and  depression.  Denies SI/HI.  No other specific complaints in a complete review of systems (except as listed in HPI above).  Objective:   Physical Exam   BP 136/74 (BP Location: Left Arm, Patient Position: Sitting, Cuff Size: Normal)   Pulse 69   Temp (!) 96.6 F (35.9 C) (Temporal)   Wt 98 lb (44.5 kg)   SpO2 99%   BMI 19.79 kg/m   Wt Readings from Last 3 Encounters:  09/15/22 99 lb (44.9 kg)  08/11/22 100 lb (45.4 kg)  05/22/22 101 lb (45.8 kg)    General: Appears her stated age, chronically ill hearing, in NAD. Neck:  Neck supple, trachea midline. No masses, lumps or thyromegaly present.  Cardiovascular: Normal rate and rhythm. S1,S2 noted.  No murmur, rubs or gallops noted. No JVD or BLE edema. No carotid bruits noted. Pulmonary/Chest: Normal effort and positive vesicular breath sounds. No respiratory distress. No wheezes, rales or ronchi noted.  Musculoskeletal: No difficulty with gait.  Neurological: Alert and oriented.  She has obvious trouble with short and long-term recall.  Coordination normal.  Psychiatric: Mood and affect flat. Behavior is normal. Judgment and thought content normal.    BMET    Component Value Date/Time   NA 141 08/11/2022 1351   K 4.5 08/11/2022 1351   CL 104 08/11/2022 1351   CO2 28 08/11/2022 1351   GLUCOSE 95 08/11/2022 1351   BUN 24 08/11/2022 1351   CREATININE 0.82  08/11/2022 1351   CALCIUM 9.9 08/11/2022 1351   GFRNONAA >60 01/01/2020 0405   GFRAA >60 01/01/2020 0405    Lipid Panel     Component Value Date/Time   CHOL 224 (H) 08/11/2022 1351   TRIG 104 08/11/2022 1351   HDL 62 08/11/2022 1351   CHOLHDL 3.6 08/11/2022 1351   VLDL 33.8 05/13/2019 1228   LDLCALC 140 (H) 08/11/2022 1351    CBC    Component Value Date/Time   WBC 7.2 08/11/2022 1351   RBC 4.02 08/11/2022 1351   HGB 13.1 08/11/2022 1351   HCT 38.6 08/11/2022 1351   PLT 341 08/11/2022 1351   MCV 96.0 08/11/2022 1351   MCH 32.6 08/11/2022 1351   MCHC 33.9 08/11/2022 1351   RDW 13.2 08/11/2022 1351   LYMPHSABS 1.5 12/31/2019 0904   MONOABS 1.0 12/31/2019 0904   EOSABS 0.1 12/31/2019 0904   BASOSABS 0.0 12/31/2019 0904    Hgb A1C Lab Results  Component Value Date   HGBA1C 5.5 08/11/2022           Assessment & Plan:     RTC in 2 months for your annual exam Nicki Reaper, NP

## 2022-11-22 NOTE — Assessment & Plan Note (Signed)
Increase mirtazapine to 30 mg nightly

## 2022-11-23 LAB — VITAMIN D 25 HYDROXY (VIT D DEFICIENCY, FRACTURES): Vit D, 25-Hydroxy: 51 ng/mL (ref 30–100)

## 2022-11-23 LAB — CBC
HCT: 39 % (ref 35.0–45.0)
Hemoglobin: 13.1 g/dL (ref 11.7–15.5)
MCH: 33.2 pg — ABNORMAL HIGH (ref 27.0–33.0)
MCHC: 33.6 g/dL (ref 32.0–36.0)
MCV: 98.7 fL (ref 80.0–100.0)
MPV: 10.7 fL (ref 7.5–12.5)
Platelets: 337 10*3/uL (ref 140–400)
RBC: 3.95 10*6/uL (ref 3.80–5.10)
RDW: 12.6 % (ref 11.0–15.0)
WBC: 10.8 10*3/uL (ref 3.8–10.8)

## 2022-11-23 LAB — COMPLETE METABOLIC PANEL WITH GFR
AG Ratio: 1.6 (calc) (ref 1.0–2.5)
ALT: 8 U/L (ref 6–29)
AST: 15 U/L (ref 10–35)
Albumin: 4.5 g/dL (ref 3.6–5.1)
Alkaline phosphatase (APISO): 51 U/L (ref 37–153)
BUN: 11 mg/dL (ref 7–25)
CO2: 28 mmol/L (ref 20–32)
Calcium: 10 mg/dL (ref 8.6–10.4)
Chloride: 97 mmol/L — ABNORMAL LOW (ref 98–110)
Creat: 0.79 mg/dL (ref 0.60–1.00)
Globulin: 2.8 g/dL (calc) (ref 1.9–3.7)
Glucose, Bld: 100 mg/dL — ABNORMAL HIGH (ref 65–99)
Potassium: 3.9 mmol/L (ref 3.5–5.3)
Sodium: 134 mmol/L — ABNORMAL LOW (ref 135–146)
Total Bilirubin: 0.5 mg/dL (ref 0.2–1.2)
Total Protein: 7.3 g/dL (ref 6.1–8.1)
eGFR: 80 mL/min/{1.73_m2} (ref >=60)

## 2022-11-23 LAB — TSH: TSH: 7.45 mIU/L — ABNORMAL HIGH (ref 0.40–4.50)

## 2022-11-23 LAB — VITAMIN B12: Vitamin B-12: 986 pg/mL (ref 200–1100)

## 2022-11-23 LAB — T4, FREE: Free T4: 1 ng/dL (ref 0.8–1.8)

## 2022-11-27 ENCOUNTER — Telehealth: Payer: Self-pay

## 2022-11-27 ENCOUNTER — Other Ambulatory Visit: Payer: Self-pay | Admitting: Internal Medicine

## 2022-11-27 DIAGNOSIS — G3184 Mild cognitive impairment, so stated: Secondary | ICD-10-CM

## 2022-11-27 DIAGNOSIS — F32A Anxiety disorder, unspecified: Secondary | ICD-10-CM

## 2022-11-27 DIAGNOSIS — F1011 Alcohol abuse, in remission: Secondary | ICD-10-CM

## 2022-11-27 NOTE — Telephone Encounter (Signed)
I did not place the referral for this, neurology did however I will place a new referral to neuropsych for her.

## 2022-11-27 NOTE — Telephone Encounter (Signed)
Copied from CRM [id number]. Topic: General - Inquiry >> Nov 24, 2022  2:31 PM De Blanch wrote: Reason for CRM: Pt husband stated PCP [facility]; however, they only accept Medicaid. They won't accept Medicare, cash, or cards.  Pt husband stated pt needs help and is requesting a new referral.  Please advise.

## 2022-11-27 NOTE — Addendum Note (Signed)
Addended by: Lorre Munroe on: 11/27/2022 10:24 AM   Modules accepted: Orders

## 2022-11-28 NOTE — Telephone Encounter (Signed)
Requested Prescriptions  Pending Prescriptions Disp Refills   busPIRone (BUSPAR) 5 MG tablet [Pharmacy Med Name: BUSPIRONE 5MG  TABLETS] 270 tablet 0    Sig: TAKE 1 TABLET(5 MG) BY MOUTH THREE TIMES DAILY     Psychiatry: Anxiolytics/Hypnotics - Non-controlled Passed - 11/27/2022 11:34 AM      Passed - Valid encounter within last 12 months    Recent Outpatient Visits           6 days ago Acquired hypothyroidism   [place] [facility] [place], Salvadore Oxford, NP   2 months ago Essential hypertension   [place] [facility] [place], Salvadore Oxford, NP   3 months ago Acquired hypothyroidism   [place] [facility] [place], Salvadore Oxford, NP   6 months ago Abnormal TSH   [facility] [place], Salvadore Oxford, NP   9 months ago Encounter for general adult medical examination with abnormal findings   [place] [facility] [place], Salvadore Oxford, NP       Future Appointments             In 1 month [place], Salvadore Oxford, NP [place] [facility]

## 2022-12-20 ENCOUNTER — Other Ambulatory Visit: Payer: Self-pay

## 2022-12-20 DIAGNOSIS — F32A Anxiety disorder, unspecified: Secondary | ICD-10-CM

## 2022-12-20 DIAGNOSIS — G3184 Mild cognitive impairment, so stated: Secondary | ICD-10-CM

## 2022-12-20 DIAGNOSIS — F1011 Alcohol abuse, in remission: Secondary | ICD-10-CM

## 2022-12-20 MED ORDER — LEVOTHYROXINE SODIUM 50 MCG PO TABS: 50.0000 ug | ORAL_TABLET | Freq: Every day | ORAL | 0 refills | Status: DC

## 2022-12-20 MED ORDER — ROSUVASTATIN CALCIUM 20 MG PO TABS: ORAL_TABLET | ORAL | 0 refills | Status: DC

## 2022-12-20 MED ORDER — BUSPIRONE HCL 5 MG PO TABS: ORAL_TABLET | ORAL | 0 refills | Status: DC

## 2022-12-20 MED ORDER — AMLODIPINE BESYLATE 10 MG PO TABS: ORAL_TABLET | ORAL | 0 refills | Status: DC

## 2022-12-20 MED ORDER — MIRTAZAPINE 30 MG PO TBDP: 30.0000 mg | ORAL_TABLET | Freq: Every day | ORAL | 0 refills | Status: DC

## 2022-12-20 MED ORDER — OLMESARTAN MEDOXOMIL-HCTZ 40-25 MG PO TABS: 1.0000 | ORAL_TABLET | Freq: Every day | ORAL | 0 refills | Status: DC

## 2022-12-20 MED ORDER — MEMANTINE HCL 10 MG PO TABS: 10.0000 mg | ORAL_TABLET | Freq: Two times a day (BID) | ORAL | 0 refills | Status: DC

## 2023-01-17 ENCOUNTER — Telehealth: Payer: Self-pay

## 2023-01-17 NOTE — Telephone Encounter (Signed)
Copied from CRM [id number]. Topic: General - Other >> Jan 17, 2023  9:06 AM Dondra Prader A wrote: Reason for CRM: [name] [name] pt sister (is on the [facility]) is calling to speak to pt PCP. Per [name] [name] pt is an alcoholism and she is concerned with her living by herself. She is needing help with outside intervention. Pt just went through a divorce and she keep telling her family she is in a bad place. Pt ex husband [name] [name] use to manage her health care but is no longer managing it due to them being divorced. [name] [name] states that they have called the police three times to do a well check on the pt and per [name] [name] she has set her up with a Child psychotherapist in [place]. [name] [name] states she does not know if the pt is having mental health issues or if it is alcoholism. [name] [name] lives in [place] and works in the emergency department and states if PCP calls her and she does not answer to please leave a voice mail.   On the [employer] phone number is entered incorrect, please up date to the correct phone number. Correct phone number:  [phone]

## 2023-01-17 NOTE — Telephone Encounter (Signed)
Please review.  Pt is also overdue for a physical.    Thanks,   -Vernona Rieger

## 2023-01-18 ENCOUNTER — Encounter: Payer: Medicare PPO | Admitting: Internal Medicine

## 2023-01-18 NOTE — Telephone Encounter (Signed)
Will try to call her after her upcoming appointment

## 2023-02-05 ENCOUNTER — Other Ambulatory Visit: Payer: Self-pay | Admitting: Internal Medicine

## 2023-02-06 NOTE — Telephone Encounter (Signed)
Requested Prescriptions  Pending Prescriptions Disp Refills   memantine (NAMENDA) 10 MG tablet [Pharmacy Med Name: MEMANTINE 10MG  TABLETS] 180 tablet 0    Sig: TAKE 1 TABLET(10 MG) BY MOUTH TWICE DAILY     Neurology:  Alzheimer's Agents 2 Passed - 02/05/2023  8:19 AM      Passed - Cr in normal range and within 360 days    Creat  Date Value Ref Range Status  11/22/2022 0.79 0.60 - 1.00 mg/dL Final         Passed - eGFR is 5 or above and within 360 days    GFR calc Af Amer  Date Value Ref Range Status  01/01/2020 >60 >60 mL/min Final   GFR calc non Af Amer  Date Value Ref Range Status  01/01/2020 >60 >60 mL/min Final   GFR  Date Value Ref Range Status  07/29/2020 86.13 >60.00 mL/min Final    Comment:    Calculated using the CKD-EPI Creatinine Equation (2021)   eGFR  Date Value Ref Range Status  11/22/2022 80 > OR = 60 mL/min/1.62m2 Final         Passed - Valid encounter within last 6 months    Recent Outpatient Visits           2 months ago Acquired hypothyroidism   [place] [facility] [place], Salvadore Oxford, NP   4 months ago Essential hypertension   [place] [facility] [place], Salvadore Oxford, NP   5 months ago Acquired hypothyroidism   [place] [facility] [place], Salvadore Oxford, NP   8 months ago Abnormal TSH   [place] [facility] [place], Salvadore Oxford, NP   12 months ago Encounter for general adult medical examination with abnormal findings   [place] [facility] [place], [place], NP               amLODipine (NORVASC) 10 MG tablet [Pharmacy Med Name: AMLODIPINE BESYLATE 10MG  TABLETS] 90 tablet 0    Sig: TAKE 1 TABLET(10 MG) BY MOUTH DAILY     Cardiovascular: Calcium Channel Blockers 2 Passed - 02/05/2023  8:19 AM      Passed - Last BP in normal range    BP Readings from Last 1 Encounters:  11/22/22 136/74         Passed - Last Heart Rate in normal range    Pulse Readings from Last 1  Encounters:  11/22/22 69         Passed - Valid encounter within last 6 months    Recent Outpatient Visits           2 months ago Acquired hypothyroidism   [place] [facility] [place], Salvadore Oxford, NP   4 months ago Essential hypertension   [place] [facility] [place], Salvadore Oxford, NP   5 months ago Acquired hypothyroidism   [place] [facility] [place], Salvadore Oxford, NP   8 months ago Abnormal TSH   [place] [facility] [place], Salvadore Oxford, NP   12 months ago Encounter for general adult medical examination with abnormal findings   [place] [facility] [place], Salvadore Oxford, NP               olmesartan-hydrochlorothiazide (BENICAR HCT) 40-25 MG tablet [Pharmacy Med Name: OLMESARTAN MEDOX/HCTZ 40-25MG  TAB] 90 tablet 0    Sig: TAKE 1 TABLET BY MOUTH DAILY  Cardiovascular: ARB + Diuretic Combos Failed - 02/05/2023  8:19 AM      Failed - Na in normal range and within 180 days    Sodium  Date Value Ref Range Status  11/22/2022 134 (L) 135 - 146 mmol/L Final         Passed - K in normal range and within 180 days    Potassium  Date Value Ref Range Status  11/22/2022 3.9 3.5 - 5.3 mmol/L Final         Passed - Cr in normal range and within 180 days    Creat  Date Value Ref Range Status  11/22/2022 0.79 0.60 - 1.00 mg/dL Final         Passed - eGFR is 10 or above and within 180 days    GFR calc Af Amer  Date Value Ref Range Status  01/01/2020 >60 >60 mL/min Final   GFR calc non Af Amer  Date Value Ref Range Status  01/01/2020 >60 >60 mL/min Final   GFR  Date Value Ref Range Status  07/29/2020 86.13 >60.00 mL/min Final    Comment:    Calculated using the CKD-EPI Creatinine Equation (2021)   eGFR  Date Value Ref Range Status  11/22/2022 80 > OR = 60 mL/min/1.40m2 Final         Passed - Patient is not pregnant      Passed - Last BP in normal range    BP Readings from Last 1 Encounters:   11/22/22 136/74         Passed - Valid encounter within last 6 months    Recent Outpatient Visits           2 months ago Acquired hypothyroidism   [place] [facility] [place], Salvadore Oxford, NP   4 months ago Essential hypertension   [place] [facility] [place], Salvadore Oxford, NP   5 months ago Acquired hypothyroidism   [place] [facility] [place], Salvadore Oxford, NP   8 months ago Abnormal TSH   [place] [facility] [place] [facility] [place], Salvadore Oxford, NP   12 months ago Encounter for general adult medical examination with abnormal findings   [facility] [place], Salvadore Oxford, NP              Refused Prescriptions Disp Refills   mirtazapine (REMERON) 15 MG tablet [Pharmacy Med Name: MIRTAZAPINE 15MG  TABLETS] 90 tablet 0    Sig: TAKE 1 TABLET(15 MG) BY MOUTH AT BEDTIME     Psychiatry: Antidepressants - mirtazapine Passed - 02/05/2023  8:19 AM      Passed - Completed PHQ-2 or PHQ-9 in the last 360 days      Passed - Valid encounter within last 6 months    Recent Outpatient Visits           2 months ago Acquired hypothyroidism   [place] [facility] [place], Salvadore Oxford, NP   4 months ago Essential hypertension   [place] [facility] [place], Salvadore Oxford, NP   5 months ago Acquired hypothyroidism   [place] [facility] [place], Salvadore Oxford, NP   8 months ago Abnormal TSH    [facility] [place], Salvadore Oxford, NP   12 months ago Encounter for general adult medical examination with abnormal findings    [facility] [place], Salvadore Oxford, NP  levothyroxine (SYNTHROID) 25 MCG tablet [Pharmacy Med Name: LEVOTHYROXINE 0.025MG  ( ) TAB] 90 tablet 0    Sig: TAKE 1 TABLET(25 MCG) BY MOUTH DAILY     Endocrinology:  Hypothyroid Agents Failed - 02/05/2023  8:19 AM      Failed - TSH in normal range and within 360 days     TSH  Date Value Ref Range Status  11/22/2022 7.45 (H) 0.40 - 4.50 mIU/L Final         Passed - Valid encounter within last 12 months    Recent Outpatient Visits           2 months ago Acquired hypothyroidism   [place] [facility] [place], Salvadore Oxford, NP   4 months ago Essential hypertension   [place] [facility] [place], Salvadore Oxford, NP   5 months ago Acquired hypothyroidism   [place] [facility] [place], Salvadore Oxford, NP   8 months ago Abnormal TSH   [place] [place], Salvadore Oxford, NP   12 months ago Encounter for general adult medical examination with abnormal findings   [place] [facility] [place], Salvadore Oxford, NP

## 2023-02-28 ENCOUNTER — Telehealth: Payer: Self-pay | Admitting: Internal Medicine

## 2023-02-28 NOTE — Telephone Encounter (Signed)
Called LVM 02/28/2023 to schedule Annual Wellness Visit  Verlee Rossetti; Care Guide Ambulatory Clinical Support [place] [facility] Direct Dial: [phone]

## 2023-03-03 ENCOUNTER — Other Ambulatory Visit: Payer: Self-pay | Admitting: Internal Medicine

## 2023-03-05 NOTE — Telephone Encounter (Signed)
Requested Prescriptions  Pending Prescriptions Disp Refills   busPIRone (BUSPAR) 5 MG tablet [Pharmacy Med Name: busPIRone HCl Oral Tablet 5 MG] 270 tablet 1    Sig: TAKE 1 TABLET THREE TIMES DAILY     Psychiatry: Anxiolytics/Hypnotics - Non-controlled Passed - 03/03/2023  7:28 AM      Passed - Valid encounter within last 12 months    Recent Outpatient Visits           3 months ago Acquired hypothyroidism   [place] [facility] [place], Salvadore Oxford, NP   5 months ago Essential hypertension   [place] [facility] [place], Salvadore Oxford, NP   6 months ago Acquired hypothyroidism   [place] [facility] [place], Salvadore Oxford, NP   9 months ago Abnormal TSH   [place] [facility] [place], Salvadore Oxford, NP   1 year ago Encounter for general adult medical examination with abnormal findings   [place] [facility] [place], Salvadore Oxford, NP               rosuvastatin (CRESTOR) 20 MG tablet [Pharmacy Med Name: Rosuvastatin Calcium Oral Tablet 20 MG] 90 tablet 1    Sig: TAKE 1 TABLET EVERY DAY     Cardiovascular:  Antilipid - Statins 2 Failed - 03/03/2023  7:28 AM      Failed - Lipid Panel in normal range within the last 12 months    Cholesterol  Date Value Ref Range Status  08/11/2022 224 (H) <200 mg/dL Final   LDL Cholesterol (Calc)  Date Value Ref Range Status  08/11/2022 140 (H) mg/dL (calc) Final    Comment:    Reference range: <100 . Desirable range <100 mg/dL for primary prevention;   <70 mg/dL for patients with CHD or diabetic patients  with > or = 2 CHD risk factors. Marland Kitchen LDL-C is now calculated using the Martin-Hopkins  calculation, which is a validated novel method providing  better accuracy than the Friedewald equation in the  estimation of LDL-C.  Horald Pollen et al. Lenox Ahr. 6387;564(33): 2061-2068  (http://education.[employer].com/faq/FAQ164)    HDL  Date Value Ref Range Status  08/11/2022 62 >  OR = 50 mg/dL Final   Triglycerides  Date Value Ref Range Status  08/11/2022 104 <150 mg/dL Final         Passed - Cr in normal range and within 360 days    Creat  Date Value Ref Range Status  11/22/2022 0.79 0.60 - 1.00 mg/dL Final         Passed - Patient is not pregnant      Passed - Valid encounter within last 12 months    Recent Outpatient Visits           3 months ago Acquired hypothyroidism   [place] [facility] [place], Salvadore Oxford, NP   5 months ago Essential hypertension   [place] [facility] [place], Salvadore Oxford, NP   6 months ago Acquired hypothyroidism   [facility] [place], Salvadore Oxford, NP   9 months ago Abnormal TSH    [facility] [place], Salvadore Oxford, NP   1 year ago Encounter for general adult medical examination with abnormal findings    [facility] [place], Salvadore Oxford, NP

## 2023-03-08 ENCOUNTER — Encounter: Payer: Medicare PPO | Admitting: Internal Medicine

## 2023-03-22 ENCOUNTER — Encounter: Payer: Medicare PPO | Admitting: Internal Medicine

## 2023-03-26 ENCOUNTER — Other Ambulatory Visit: Payer: Self-pay | Admitting: Internal Medicine

## 2023-03-27 ENCOUNTER — Other Ambulatory Visit: Payer: Self-pay | Admitting: Internal Medicine

## 2023-03-27 DIAGNOSIS — G3184 Mild cognitive impairment, so stated: Secondary | ICD-10-CM

## 2023-03-27 DIAGNOSIS — F1011 Alcohol abuse, in remission: Secondary | ICD-10-CM

## 2023-03-27 DIAGNOSIS — F32A Depression, unspecified: Secondary | ICD-10-CM

## 2023-03-27 MED ORDER — LEVOTHYROXINE SODIUM 50 MCG PO TABS: 50.0000 ug | ORAL_TABLET | Freq: Every day | ORAL | 1 refills | Status: DC

## 2023-03-27 MED ORDER — AMLODIPINE BESYLATE 10 MG PO TABS: ORAL_TABLET | ORAL | 1 refills | Status: DC

## 2023-03-27 MED ORDER — ROSUVASTATIN CALCIUM 20 MG PO TABS: ORAL_TABLET | ORAL | 1 refills | Status: DC

## 2023-03-27 MED ORDER — BUSPIRONE HCL 5 MG PO TABS: ORAL_TABLET | ORAL | 1 refills | Status: DC

## 2023-06-25 ENCOUNTER — Telehealth: Payer: Self-pay | Admitting: Internal Medicine

## 2023-06-25 NOTE — Telephone Encounter (Signed)
 Called LVM 06/25/2023 to schedule AWV. Please schedule office or virtual visits.  [name] [name]; Care Guide Ambulatory Clinical Support [place] [facility] Direct Dial: [phone]

## 2023-06-28 ENCOUNTER — Telehealth: Payer: Self-pay

## 2023-06-28 NOTE — Telephone Encounter (Signed)
Copied from CRM [id number]. Topic: General - Other >> Jun 28, 2023  9:53 AM [place] B wrote: Reason for CRM: pt's sister Ms Ezequiel Kayser called in, to ask if Dr Sampson Si can call her during pt's appt  on 02/20 so she can hear what's going on. She is in [place] and can't come ot the appt with her

## 2023-06-28 NOTE — Telephone Encounter (Signed)
The patient can call and put her on speaker phone but I can not because she is not on the patients DPR

## 2023-06-28 NOTE — Telephone Encounter (Signed)
[name] [name] is listed on DPR and will be phoned in for the upcoming visit by patient.

## 2023-07-05 ENCOUNTER — Ambulatory Visit: Payer: Medicare HMO | Admitting: Internal Medicine

## 2023-07-12 ENCOUNTER — Ambulatory Visit: Payer: Medicare HMO | Admitting: Internal Medicine

## 2023-07-12 ENCOUNTER — Ambulatory Visit (INDEPENDENT_AMBULATORY_CARE_PROVIDER_SITE_OTHER): Payer: Medicare HMO | Admitting: Internal Medicine

## 2023-07-12 ENCOUNTER — Encounter: Payer: Self-pay | Admitting: Internal Medicine

## 2023-07-12 VITALS — BP 130/72 | Ht 59.0 in | Wt 93.8 lb

## 2023-07-12 DIAGNOSIS — F1011 Alcohol abuse, in remission: Secondary | ICD-10-CM

## 2023-07-12 DIAGNOSIS — H6991 Unspecified Eustachian tube disorder, right ear: Secondary | ICD-10-CM

## 2023-07-12 DIAGNOSIS — I10 Essential (primary) hypertension: Secondary | ICD-10-CM | POA: Diagnosis not present

## 2023-07-12 DIAGNOSIS — K529 Noninfective gastroenteritis and colitis, unspecified: Secondary | ICD-10-CM | POA: Diagnosis not present

## 2023-07-12 DIAGNOSIS — F32A Depression, unspecified: Secondary | ICD-10-CM

## 2023-07-12 DIAGNOSIS — R7303 Prediabetes: Secondary | ICD-10-CM | POA: Diagnosis not present

## 2023-07-12 DIAGNOSIS — M47818 Spondylosis without myelopathy or radiculopathy, sacral and sacrococcygeal region: Secondary | ICD-10-CM

## 2023-07-12 DIAGNOSIS — E039 Hypothyroidism, unspecified: Secondary | ICD-10-CM

## 2023-07-12 DIAGNOSIS — G3184 Mild cognitive impairment, so stated: Secondary | ICD-10-CM | POA: Diagnosis not present

## 2023-07-12 DIAGNOSIS — E7800 Pure hypercholesterolemia, unspecified: Secondary | ICD-10-CM | POA: Diagnosis not present

## 2023-07-12 DIAGNOSIS — F419 Anxiety disorder, unspecified: Secondary | ICD-10-CM

## 2023-07-12 MED ORDER — DONEPEZIL HCL 5 MG PO TABS: 5.0000 mg | ORAL_TABLET | Freq: Every day | ORAL | 1 refills | Status: DC

## 2023-07-12 NOTE — Assessment & Plan Note (Signed)
C-Met and lipid profile today Encouraged her to consume low-fat diet Continue rosuvastatin 

## 2023-07-12 NOTE — Assessment & Plan Note (Signed)
 Avoid red meat Okay to take Imodium OTC as needed

## 2023-07-12 NOTE — Progress Notes (Signed)
 Subjective:    Patient ID: [name] [name], female    DOB: 22-Apr-1951, 73 y.o.   MRN: [id number]  HPI  Patient presents to clinic today for follow-up of chronic conditions.  HTN: Her BP today is 130/72. She is taking amlodipine, olmesartan HCT as prescribed. ECG from 10/2017 reviewed.  MCI: She reports persistent memory issues. Managed on memantine. MRI brain from 10/2020 reviewed. She no longer follows with neurology.  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 She takes tramadol as needed. She does not follow with orthopedics.  Anxiety and depression: Persistent. She is taking mirtazapine and buspirone and do feel like they are working well for her. She is not currently seeing a therapist. She deneis SI/HI.  Chronic diarrhea:  Improved.  She is not currently taking any medications for this.  She follows with GI.  HLD: Her last LDL was 140, triglycericeds 104, 07/2022. She denies myalgias on rosuvastatin.  She has been trying to consume low-fat diet.  Hypothyroidism: She is unsure if she is actually taking levothyroxine.  She does not follow with endocrinology.  Prediabetes: Her last A1c was 5.5%, 07/2022.  She is not taking any oral diabetic medications time.  She does not check her sugars.  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 Swallow whole. 30 tablet 12    busPIRone (BUSPAR) 5 MG tablet TAKE 1 TABLET THREE TIMES DAILY 270 tablet 1   Cholecalciferol (VITAMIN D3) 50 MCG (2000 UT) TABS Take 1 tablet by mouth daily.     Cyanocobalamin (B-12) 5000 MCG CAPS Take 1 capsule by mouth daily.     levothyroxine (SYNTHROID) 50 MCG tablet Take 1 tablet (50 mcg total) by mouth daily. 90 tablet 1   memantine (NAMENDA) 10 MG tablet TAKE 1 TABLET TWICE DAILY 180 tablet 1   mirtazapine (REMERON SOL-TAB) 30 MG disintegrating tablet DISSOLVE 1 TABLET ON THE TONGUE AT BEDTIME 90 tablet 1   olmesartan-hydrochlorothiazide (BENICAR HCT) 40-25 MG tablet TAKE 1 TABLET EVERY DAY 90 tablet 1   rosuvastatin (CRESTOR) 20 MG tablet TAKE 1 TABLET EVERY DAY 90 tablet 1   No current facility-administered medications for this visit.    Allergies  Allergen Reactions   Erythromycin Other (See Comments)    REACTION: GI upset   Penicillins Nausea And Vomiting    Family History  Problem Relation Age of Onset   Diabetes Mother    Stroke Mother    Other Mother        colon sugery   Polymyalgia rheumatica Mother    Memory loss Mother        late 10s   Alcoholism Mother    Memory loss Father    Heart disease Father    Diabetes Father    Dementia Father        unspecified type; late 70s/early  80s   Colon cancer Neg Hx    Cancer Neg Hx    Esophageal cancer Neg Hx    Pancreatic cancer Neg Hx    Stomach cancer Neg Hx     Social History   Socioeconomic History   Marital status: Married    Spouse name: Not on file   Number of children: 2   Years of education: 16   Highest education level: Bachelor's degree (e.g., BA, AB, BS)  Occupational History   Occupation: Retired    Comment: Engineer, site  Tobacco Use   Smoking status: Every Day    Current packs/day: 1.00    Types: Cigarettes   Smokeless tobacco: Never  Vaping Use   Vaping status: Former  Substance and Sexual Activity   Alcohol use: Not Currently    Comment: occ   Drug use: No   Sexual activity:  Not Currently    Birth control/protection: Post-menopausal  Other Topics Concern   Not on file  Social History Narrative   Not on file   Social Drivers of Health   Financial Resource Strain: Not on file  Food Insecurity: Not on file  Transportation Needs: Not on file  Physical Activity: Not on file  Stress: Not on file  Social Connections: Not on file  Intimate Partner Violence: Not on file     Constitutional: Patient reports fatigue.  Denies fever, malaise, headache or abrupt weight changes.  HEENT: Patient reports right ear pain.  Denies eye pain, eye redness, ringing in the ears, wax buildup, runny nose, nasal congestion, bloody nose, or sore throat. Respiratory: Denies difficulty breathing, shortness of breath, cough or sputum production.   Cardiovascular: Denies chest pain, chest tightness, palpitations or swelling in the hands or feet.  Gastrointestinal: Patient reports intermittent diarrhea.  Denies abdominal pain, bloating, constipation, or blood in the stool.  GU: Denies urgency, frequency, pain with urination, burning sensation, blood in urine, odor or discharge. Musculoskeletal: Patient reports intermittent pain in her tailbone.  Denies decrease in range of motion, difficulty with gait, muscle pain or joint swelling.  Skin: Denies redness, rashes, lesions or ulcercations.  Neurological: Patient reports difficulty with memory, insomnia.  Denies dizziness, difficulty with speech or problems with balance and coordination.  Psych: Patient has a history of anxiety and depression.  Denies SI/HI.  No other specific complaints in a complete review of systems (except as listed in HPI above).  Objective:   Physical Exam BP 130/72 (BP Location: Left Arm, Patient Position: Sitting, Cuff Size: Normal)   Ht 4\' 11"  (1.499 m)   Wt 93 lb 12.8 oz (42.5 kg)   BMI 18.95 kg/m   Wt Readings from Last 3 Encounters:  11/22/22 98 lb (44.5 kg)  09/15/22 99 lb (44.9 kg)  08/11/22 100 lb  (45.4 kg)    General: Appears her stated age, underweight, in NAD. Skin: Warm, dry and intact.  HEENT: Head: normal shape and size; Eyes: sclera white, no icterus, conjunctiva pink, PERRLA and EOMs intact; right ear: TM gray and intact, + serous effusion noted Neck:  Neck supple, trachea midline. No masses, lumps or thyromegaly present.  Cardiovascular: Normal rate and rhythm. S1,S2 noted.  No murmur, rubs or gallops noted. No JVD or BLE edema. No carotid bruits noted. Pulmonary/Chest: Normal effort and positive vesicular breath sounds. No respiratory distress. No wheezes, rales or ronchi noted.  Abdomen: Soft and nontender. Normal bowel sounds.  Musculoskeletal: No difficulty with gait.  Neurological: Alert and oriented.  She has trouble  with recall.  Coordination normal.  Psychiatric: Mood and affect normal.  Behavior is normal.  Judgment and thought content normal.    BMET    Component Value Date/Time   NA 134 (L) 11/22/2022 1039   K 3.9 11/22/2022 1039   CL 97 (L) 11/22/2022 1039   CO2 28 11/22/2022 1039   GLUCOSE 100 (H) 11/22/2022 1039   BUN 11 11/22/2022 1039   CREATININE 0.79 11/22/2022 1039   CALCIUM 10.0 11/22/2022 1039   GFRNONAA >60 01/01/2020 0405   GFRAA >60 01/01/2020 0405    Lipid Panel     Component Value Date/Time   CHOL 224 (H) 08/11/2022 1351   TRIG 104 08/11/2022 1351   HDL 62 08/11/2022 1351   CHOLHDL 3.6 08/11/2022 1351   VLDL 33.8 05/13/2019 1228   LDLCALC 140 (H) 08/11/2022 1351    CBC    Component Value Date/Time   WBC 10.8 11/22/2022 1039   RBC 3.95 11/22/2022 1039   HGB 13.1 11/22/2022 1039   HCT 39.0 11/22/2022 1039   PLT 337 11/22/2022 1039   MCV 98.7 11/22/2022 1039   MCH 33.2 (H) 11/22/2022 1039   MCHC 33.6 11/22/2022 1039   RDW 12.6 11/22/2022 1039   LYMPHSABS 1.5 12/31/2019 0904   MONOABS 1.0 12/31/2019 0904   EOSABS 0.1 12/31/2019 0904   BASOSABS 0.0 12/31/2019 0904    Hgb A1C Lab Results  Component Value Date   HGBA1C  5.5 08/11/2022            Assessment & Plan:   ETD right:  Recommend Flonase 1 spray 2 times daily for the next 7 to 10 days  RTC in 3 months for your annual exam Nicki Reaper, NP

## 2023-07-12 NOTE — Patient Instructions (Signed)
 Problems With Thinking and Memory (Mild Neurocognitive Disorder): What to Know Mild neurocognitive disorder, formerly known as mild cognitive impairment, is a disorder where your memory doesn't work as well as it should. It may also affect other mental abilities like thinking, communicating, behavior, and being able to finish tasks. These problems can be noticed and measured. But they usually don't stop you from doing daily activities or living on your own. Mild neurocognitive disorder usually happens after 71 years of age. But it can also happen at younger ages. It's not as serious as major neurocognitive disorder, also known as dementia, but it may be the first sign of it. In general, the symptoms of this condition get worse over time. In rare cases, symptoms can get better. What are the causes? This condition may be caused by: Brain disorders like Alzheimer's disease, Parkinson's disease, and other conditions that slowly damage nerve cells. Diseases that affect the blood vessels in the brain and cause small strokes. Certain infections, like HIV. Traumatic brain injury. Other medical conditions, such as brain tumors, underactive thyroid (hypothyroidism), and not having enough vitamin B12. Using certain drugs or medicines. What increases the risk? Being older than 71 years of age. Being female. Having a lower level of education. Diabetes, high blood pressure, high cholesterol, and other conditions that raise the risk for blood vessel diseases. Untreated or undertreated sleep apnea. Having a certain type of gene that can be inherited, or passed down from parent to child. Long-term health problems like heart disease, lung disease, liver disease, kidney disease, or depression. What are the signs or symptoms? Trouble remembering things. You may: Forget names, phone numbers, or details of recent events. Forget about social events and appointments. Often forget where you put your car keys or other  items. Trouble thinking and solving problems. You may have trouble with complex tasks like: Paying bills. Driving in places you don't know well. Trouble communicating. You may have trouble: Finding the right word or naming an object. Forming a sentence that makes sense. Understanding what you read or hear. Changes in your behavior or personality. When this happens, you may: Lose interest in the things you used to enjoy. Avoid being around people. Get angry more easily than usual. Act before thinking. How is this diagnosed? This condition is diagnosed based on: Your symptoms. Your health care provider may ask you and the people you spend time with, like family and friends, about your symptoms. Memory tests and other tests to check how your brain is working. Your provider may refer you to a provider called a neurologist or a mental health specialist. To try to find out the cause of your condition, your provider may: Get a detailed medical history. Ask about use of alcohol, drugs, and medicines. Do a physical exam. Order blood tests and brain imaging tests. How is this treated? Mild neurocognitive disorder that's caused by medicine use, drug use, infection, or another medical condition may get better when the cause is treated, or when medicines or drugs are stopped. If this disorder has another cause, it usually doesn't improve and may get worse. In these cases, the goal of treatment is to help you manage the symptoms. This may include: Medicines to help with memory and behavior symptoms. Talk therapy. This provides education, emotional support, memory aids, and other ways of making up for problems with mental tasks. Lifestyle changes. These may include: Getting regular exercise. Eating a healthy diet that includes omega-3 fatty acids. Doing things to challenge your thinking  and memory skills. Spending more time being with and talking to other people. Using routines like having regular  times for meals and going to bed. Follow these instructions at home: Eating and drinking  Drink more fluids as told. Eat a healthy diet that includes omega-3 fatty acids. These can be found in: Fish. Nuts. Leafy vegetables. Vegetable oils. If you drink alcohol: Limit how much you have to: 0-1 drink a day if you're female. 0-2 drinks a day if you're female. Know how much alcohol is in your drink. In the [place] one drink is one 12 oz bottle of beer (355 mL), one 5 oz glass of wine (148 mL), or one 1 oz glass of hard liquor (44 mL). Lifestyle  Get regular exercise as told by your provider. Do not smoke, vape, or use nicotine or tobacco. Use healthy ways to manage stress. If you need help managing stress, ask your provider. Keep spending time with other people. Keep your mind active by doing activities you enjoy, like reading or playing games. Make sure you get good sleep at night. These tips can help: Try not to take naps during the day. Keep your bedroom dark and cool. Do not exercise in the few hours before you go to bed. Do not have foods or drinks with caffeine at night. General instructions Take medicines only as told. Your provider may tell you to avoid taking medicines that can affect thinking. These include some medicines for pain or sleeping. Work with your provider to find out: What things you need help with. What your safety needs are. Where to find more information [employer] on Aging: [employer]pl Contact a health care provider if: You have any new symptoms. Get help right away if: You have new confusion or your confusion gets worse. You act in ways that put you or your family in danger. This information is not intended to replace advice given to you by your health care provider. Make sure you discuss any questions you have with your health care provider. Document Revised: 10/24/2022 Document Reviewed: 10/24/2022 Elsevier Patient Education  2024 [employer].

## 2023-07-12 NOTE — Assessment & Plan Note (Signed)
 Continue tramadol as needed

## 2023-07-12 NOTE — Assessment & Plan Note (Signed)
 TSH and free T4 today We will adjust levothyroxine if needed based on labs

## 2023-07-12 NOTE — Assessment & Plan Note (Signed)
 Persistent Continue memantine as previously prescribed Will add Aricept 5 mg daily at bedtime

## 2023-07-12 NOTE — Assessment & Plan Note (Signed)
 A1c today Encourage low-carb diet and exercise for weight loss

## 2023-07-12 NOTE — Assessment & Plan Note (Signed)
 Recommend that she abstains from alcohol

## 2023-07-12 NOTE — Assessment & Plan Note (Signed)
 Continue mirtazapine and buspirone as previously prescribed Support offered

## 2023-07-12 NOTE — Assessment & Plan Note (Signed)
 Controlled on amlodipine and olmesartan HCT Reinforced DASH diet Will continue to monitor

## 2023-07-13 LAB — CBC
HCT: 41.1 % (ref 35.0–45.0)
Hemoglobin: 13.7 g/dL (ref 11.7–15.5)
MCH: 32.4 pg (ref 27.0–33.0)
MCHC: 33.3 g/dL (ref 32.0–36.0)
MCV: 97.2 fL (ref 80.0–100.0)
MPV: 12.8 fL — ABNORMAL HIGH (ref 7.5–12.5)
Platelets: 349 10*3/uL (ref 140–400)
RBC: 4.23 10*6/uL (ref 3.80–5.10)
RDW: 12.7 % (ref 11.0–15.0)
WBC: 9.4 10*3/uL (ref 3.8–10.8)

## 2023-07-13 LAB — LIPID PANEL
Cholesterol: 229 mg/dL — ABNORMAL HIGH (ref <200)
HDL: 48 mg/dL — ABNORMAL LOW (ref >=50)
LDL Cholesterol (Calc): 160 mg/dL — ABNORMAL HIGH
Non-HDL Cholesterol (Calc): 181 mg/dL — ABNORMAL HIGH (ref <130)
Total CHOL/HDL Ratio: 4.8 (calc) (ref <5.0)
Triglycerides: 99 mg/dL (ref <150)

## 2023-07-13 LAB — HEMOGLOBIN A1C
Hgb A1c MFr Bld: 5.4 %{Hb} (ref <5.7)
Mean Plasma Glucose: 108 mg/dL
eAG (mmol/L): 6 mmol/L

## 2023-07-13 LAB — COMPLETE METABOLIC PANEL WITH GFR
AG Ratio: 1.5 (calc) (ref 1.0–2.5)
ALT: 9 U/L (ref 6–29)
AST: 17 U/L (ref 10–35)
Albumin: 4.1 g/dL (ref 3.6–5.1)
Alkaline phosphatase (APISO): 69 U/L (ref 37–153)
BUN: 7 mg/dL (ref 7–25)
CO2: 28 mmol/L (ref 20–32)
Calcium: 10 mg/dL (ref 8.6–10.4)
Chloride: 105 mmol/L (ref 98–110)
Creat: 0.75 mg/dL (ref 0.60–1.00)
Globulin: 2.8 g/dL (ref 1.9–3.7)
Glucose, Bld: 85 mg/dL (ref 65–139)
Potassium: 4.1 mmol/L (ref 3.5–5.3)
Sodium: 140 mmol/L (ref 135–146)
Total Bilirubin: 0.5 mg/dL (ref 0.2–1.2)
Total Protein: 6.9 g/dL (ref 6.1–8.1)
eGFR: 85 mL/min/{1.73_m2} (ref >=60)

## 2023-07-13 LAB — TSH+FREE T4: TSH W/REFLEX TO FT4: 9.32 m[IU]/L — ABNORMAL HIGH (ref 0.40–4.50)

## 2023-07-13 LAB — T4, FREE: Free T4: 1 ng/dL (ref 0.8–1.8)

## 2023-07-17 ENCOUNTER — Other Ambulatory Visit: Payer: Self-pay

## 2023-07-17 MED ORDER — LEVOTHYROXINE SODIUM 75 MCG PO TABS
75.0000 ug | ORAL_TABLET | Freq: Every day | ORAL | 0 refills | Status: DC
Start: 2023-07-17 — End: 2023-07-18

## 2023-07-17 MED ORDER — ROSUVASTATIN CALCIUM 40 MG PO TABS: 40.0000 mg | ORAL_TABLET | Freq: Every day | ORAL | 0 refills | Status: DC

## 2023-07-18 ENCOUNTER — Other Ambulatory Visit: Payer: Self-pay

## 2023-07-18 MED ORDER — LEVOTHYROXINE SODIUM 75 MCG PO TABS: 75.0000 ug | ORAL_TABLET | Freq: Every day | ORAL | 0 refills | Status: DC

## 2023-07-18 MED ORDER — DONEPEZIL HCL 5 MG PO TABS: 5.0000 mg | ORAL_TABLET | Freq: Every day | ORAL | 1 refills | Status: DC

## 2023-07-18 MED ORDER — ROSUVASTATIN CALCIUM 40 MG PO TABS: 40.0000 mg | ORAL_TABLET | Freq: Every day | ORAL | 0 refills | Status: DC

## 2023-07-27 ENCOUNTER — Other Ambulatory Visit: Payer: Self-pay | Admitting: Internal Medicine

## 2023-07-27 NOTE — Telephone Encounter (Signed)
 Copied from CRM [id number]. Topic: Clinical - Medication Refill >> Jul 27, 2023 10:19 AM Alessandra Bevels wrote: Most Recent Primary Care Visit:  Provider: Lorre Munroe  Department: [facility]  Visit Type: OFFICE VISIT  Date: 07/12/2023  Medication: levothyroxine (SYNTHROID) 75 MCG tablet [[id number]]  rosuvastatin (CRESTOR) 40 MG tablet [[id number]]  Has the patient contacted their pharmacy? Yes (Agent: If no, request that the patient contact the pharmacy for the refill. If patient does not wish to contact the pharmacy document the reason why and proceed with request.) (Agent: If yes, when and what did the pharmacy advise?)  Is this the correct pharmacy for this prescription? Yes If no, delete pharmacy and type the correct one.  This is the patient's preferred pharmacy:  [facility] - [place], [place] - [address] [place] [address] Phone: [phone] Fax: [phone]   Has the prescription been filled recently? Yes  Is the patient out of the medication? Yes  Has the patient been seen for an appointment in the last year OR does the patient have an upcoming appointment? Yes  Can we respond through MyChart? Yes  Agent: Please be advised that Rx refills may take up to 3 business days. We ask that you follow-up with your pharmacy.

## 2023-07-27 NOTE — Telephone Encounter (Signed)
 Requested medications are due for refill today.  no  Requested medications are on the active medications list.  yes  Last refill. 07/18/2023 #[id number] [phone] [id number]  Future visit scheduled.   no  Notes to clinic.  Pt wants rx's sent to a different pharmacy.    Requested Prescriptions  Pending Prescriptions Disp Refills   levothyroxine (SYNTHROID) 75 MCG tablet 90 tablet 0    Sig: Take 1 tablet (75 mcg total) by mouth daily before breakfast.     Endocrinology:  Hypothyroid Agents Failed - 07/27/2023  4:42 PM      Failed - TSH in normal range and within 360 days    TSH  Date Value Ref Range Status  11/22/2022 7.45 (H) 0.40 - 4.50 mIU/L Final         Passed - Valid encounter within last 12 months    Recent Outpatient Visits           8 months ago Acquired hypothyroidism   [place], Salvadore [place], NP   10 months ago Essential hypertension   [place] [facility] [place], Salvadore Oxford, NP   11 months ago Acquired hypothyroidism   [place] [facility] [place], Salvadore Oxford, NP   1 year ago Abnormal TSH   [place] [facility] [place], Salvadore Oxford, NP   1 year ago Encounter for general adult medical examination with abnormal findings   [place] [facility] [place], Salvadore Oxford, NP               rosuvastatin (CRESTOR) 40 MG tablet 90 tablet 0    Sig: Take 1 tablet (40 mg total) by mouth daily.     Cardiovascular:  Antilipid - Statins 2 Failed - 07/27/2023  4:42 PM      Failed - Lipid Panel in normal range within the last 12 months    Cholesterol  Date Value Ref Range Status  07/12/2023 229 (H) <200 mg/dL Final   LDL Cholesterol (Calc)  Date Value Ref Range Status  07/12/2023 160 (H) mg/dL (calc) Final    Comment:    Reference range: <100 . Desirable range <100 mg/dL for primary prevention;   <70 mg/dL for patients with CHD or diabetic patients  with > or = 2 CHD risk factors. Marland Kitchen LDL-C is now  calculated using the Martin-Hopkins  calculation, which is a validated novel method providing  better accuracy than the Friedewald equation in the  estimation of LDL-C.  Horald Pollen et al. Lenox Ahr. 1610;960(45): 2061-2068  (http://education.Quest[employer]ostics.com/faq/FAQ164)    HDL  Date Value Ref Range Status  07/12/2023 48 (L) > OR = 50 mg/dL Final   Triglycerides  Date Value Ref Range Status  07/12/2023 99 <150 mg/dL Final         Passed - Cr in normal range and within 360 days    Creat  Date Value Ref Range Status  07/12/2023 0.75 0.60 - 1.00 mg/dL Final         Passed - Patient is not pregnant      Passed - Valid encounter within last 12 months    Recent Outpatient Visits           8 months ago Acquired hypothyroidism   [place] [facility] [place], Salvadore Oxford, NP   10 months ago Essential hypertension   [place] [facility] [place], Salvadore Oxford, NP   11 months ago Acquired  hypothyroidism   [place] [facility] [place], Salvadore Oxford, NP   1 year ago Abnormal TSH   [place] [facility] [place], Salvadore Oxford, NP   1 year ago Encounter for general adult medical examination with abnormal findings   [place] [facility] [place], Salvadore [place], [place]

## 2023-10-06 ENCOUNTER — Other Ambulatory Visit: Payer: Self-pay | Admitting: Internal Medicine

## 2023-10-10 NOTE — Telephone Encounter (Signed)
 Requested Prescriptions  Pending Prescriptions Disp Refills   busPIRone  (BUSPAR ) 5 MG tablet [Pharmacy Med Name: busPIRone  HCl Oral Tablet 5 MG] 270 tablet 0    Sig: TAKE 1 TABLET THREE TIMES DAILY     Psychiatry: Anxiolytics/Hypnotics - Non-controlled Passed - 10/10/2023  8:06 AM      Passed - Valid encounter within last 12 months    Recent Outpatient Visits           3 months ago Pure hypercholesterolemia   [place] [facility] [place], [place], [place]

## 2023-10-11 ENCOUNTER — Ambulatory Visit (INDEPENDENT_AMBULATORY_CARE_PROVIDER_SITE_OTHER): Payer: Medicare HMO | Admitting: Internal Medicine

## 2023-10-11 ENCOUNTER — Telehealth: Payer: Self-pay

## 2023-10-11 ENCOUNTER — Encounter: Payer: Self-pay | Admitting: Internal Medicine

## 2023-10-11 VITALS — BP 90/60 | Ht 59.0 in | Wt 93.4 lb

## 2023-10-11 DIAGNOSIS — Z681 Body mass index (BMI) 19 or less, adult: Secondary | ICD-10-CM

## 2023-10-11 DIAGNOSIS — Z0001 Encounter for general adult medical examination with abnormal findings: Secondary | ICD-10-CM

## 2023-10-11 DIAGNOSIS — G3184 Mild cognitive impairment, so stated: Secondary | ICD-10-CM

## 2023-10-11 DIAGNOSIS — F1090 Alcohol use, unspecified, uncomplicated: Secondary | ICD-10-CM

## 2023-10-11 DIAGNOSIS — E7800 Pure hypercholesterolemia, unspecified: Secondary | ICD-10-CM

## 2023-10-11 DIAGNOSIS — R42 Dizziness and giddiness: Secondary | ICD-10-CM

## 2023-10-11 DIAGNOSIS — R7303 Prediabetes: Secondary | ICD-10-CM

## 2023-10-11 DIAGNOSIS — M6259 Muscle wasting and atrophy, not elsewhere classified, multiple sites: Secondary | ICD-10-CM

## 2023-10-11 DIAGNOSIS — R636 Underweight: Secondary | ICD-10-CM | POA: Insufficient documentation

## 2023-10-11 DIAGNOSIS — L659 Nonscarring hair loss, unspecified: Secondary | ICD-10-CM

## 2023-10-11 DIAGNOSIS — E039 Hypothyroidism, unspecified: Secondary | ICD-10-CM

## 2023-10-11 MED ORDER — MECLIZINE HCL 25 MG PO TABS: 25.0000 mg | ORAL_TABLET | Freq: Three times a day (TID) | ORAL | 0 refills | Status: AC | PRN

## 2023-10-11 NOTE — Assessment & Plan Note (Signed)
 Encourage high protein, high calorie diet

## 2023-10-11 NOTE — Telephone Encounter (Signed)
 I will have Cieara call her and put her on speaker phone during her appointment

## 2023-10-11 NOTE — Patient Instructions (Signed)
 Health Maintenance for Postmenopausal Women Menopause is a normal process in which your ability to get pregnant comes to an end. This process happens slowly over many months or years, usually between the ages of 24 and 62. Menopause is complete when you have missed your menstrual period for 12 months. It is important to talk with your health care provider about some of the most common conditions that affect women after menopause (postmenopausal women). These include heart disease, cancer, and bone loss (osteoporosis). Adopting a healthy lifestyle and getting preventive care can help to promote your health and wellness. The actions you take can also lower your chances of developing some of these common conditions. What are the signs and symptoms of menopause? During menopause, you may have the following symptoms: Hot flashes. These can be moderate or severe. Night sweats. Decrease in sex drive. Mood swings. Headaches. Tiredness (fatigue). Irritability. Memory problems. Problems falling asleep or staying asleep. Talk with your health care provider about treatment options for your symptoms. Do I need hormone replacement therapy? Hormone replacement therapy is effective in treating symptoms that are caused by menopause, such as hot flashes and night sweats. Hormone replacement carries certain risks, especially as you become older. If you are thinking about using estrogen or estrogen with progestin, discuss the benefits and risks with your health care provider. How can I reduce my risk for heart disease and stroke? The risk of heart disease, heart attack, and stroke increases as you age. One of the causes may be a change in the body's hormones during menopause. This can affect how your body uses dietary fats, triglycerides, and cholesterol. Heart attack and stroke are medical emergencies. There are many things that you can do to help prevent heart disease and stroke. Watch your blood pressure High  blood pressure causes heart disease and increases the risk of stroke. This is more likely to develop in people who have high blood pressure readings or are overweight. Have your blood pressure checked: Every 3-5 years if you are 50-75 years of age. Every year if you are 77 years old or older. Eat a healthy diet  Eat a diet that includes plenty of vegetables, fruits, low-fat dairy products, and lean protein. Do not eat a lot of foods that are high in solid fats, added sugars, or sodium. Get regular exercise Get regular exercise. This is one of the most important things you can do for your health. Most adults should: Try to exercise for at least 150 minutes each week. The exercise should increase your heart rate and make you sweat (moderate-intensity exercise). Try to do strengthening exercises at least twice each week. Do these in addition to the moderate-intensity exercise. Spend less time sitting. Even light physical activity can be beneficial. Other tips Work with your health care provider to achieve or maintain a healthy weight. Do not use any products that contain nicotine or tobacco. These products include cigarettes, chewing tobacco, and vaping devices, such as e-cigarettes. If you need help quitting, ask your health care provider. Know your numbers. Ask your health care provider to check your cholesterol and your blood sugar (glucose). Continue to have your blood tested as directed by your health care provider. Do I need screening for cancer? Depending on your health history and family history, you may need to have cancer screenings at different stages of your life. This may include screening for: Breast cancer. Cervical cancer. Lung cancer. Colorectal cancer. What is my risk for osteoporosis? After menopause, you may be  at increased risk for osteoporosis. Osteoporosis is a condition in which bone destruction happens more quickly than new bone creation. To help prevent osteoporosis or  the bone fractures that can happen because of osteoporosis, you may take the following actions: If you are 61-3 years old, get at least 1,000 mg of calcium and at least 600 international units (IU) of vitamin D per day. If you are older than age 61 but younger than age 75, get at least 1,200 mg of calcium and at least 600 international units (IU) of vitamin D per day. If you are older than age 62, get at least 1,200 mg of calcium and at least 800 international units (IU) of vitamin D per day. Smoking and drinking excessive alcohol increase the risk of osteoporosis. Eat foods that are rich in calcium and vitamin D, and do weight-bearing exercises several times each week as directed by your health care provider. How does menopause affect my mental health? Depression may occur at any age, but it is more common as you become older. Common symptoms of depression include: Feeling depressed. Changes in sleep patterns. Changes in appetite or eating patterns. Feeling an overall lack of motivation or enjoyment of activities that you previously enjoyed. Frequent crying spells. Talk with your health care provider if you think that you are experiencing any of these symptoms. General instructions See your health care provider for regular wellness exams and vaccines. This may include: Scheduling regular health, dental, and eye exams. Getting and maintaining your vaccines. These include: Influenza vaccine. Get this vaccine each year before the flu season begins. Pneumonia vaccine. Shingles vaccine. Tetanus, diphtheria, and pertussis (Tdap) booster vaccine. Your health care provider may also recommend other immunizations. Tell your health care provider if you have ever been abused or do not feel safe at home. Summary Menopause is a normal process in which your ability to get pregnant comes to an end. This condition causes hot flashes, night sweats, decreased interest in sex, mood swings, headaches, or lack  of sleep. Treatment for this condition may include hormone replacement therapy. Take actions to keep yourself healthy, including exercising regularly, eating a healthy diet, watching your weight, and checking your blood pressure and blood sugar levels. Get screened for cancer and depression. Make sure that you are up to date with all your vaccines. This information is not intended to replace advice given to you by your health care provider. Make sure you discuss any questions you have with your health care provider. Document Revised: 09/20/2020 Document Reviewed: 09/20/2020 Elsevier Patient Education  2024 [employer].

## 2023-10-11 NOTE — Telephone Encounter (Signed)
 Copied from CRM [id number]. Topic: Appointments - Appointment Info/Confirmation >> Oct 11, 2023 12:26 PM Ethelle Herb L wrote: Patient/patient representative is calling for information regarding an appointment.   Pt's sister, Aleda Hurl is requesting to be called during patient's appointment today so that she can be included in visit. Best call back number: [phone]

## 2023-10-11 NOTE — Progress Notes (Signed)
 Subjective:    Patient ID: [name] [name], female    DOB: 1950-12-25, 73 y.o.   MRN: [id number]  HPI  Patient presents to clinic today for her annual exam.  Flu: 02/2019 Tetanus: 07/2013 COVID: Pfizer x2 Pneumovax: 02/2019 Prevnar: 10/2017 Shingrix: 02/2019 Pap smear: 11/2012 Mammogram: unsure Bone density: unsure Colon screening: 05/2021 Vision screening: annually Dentist: biannually  Diet:  She does eat meat. She consumes fruits and veggies. She tries to avoid fried foods. She drinks mostly unsweet green tea. 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 Swallow whole. 30 tablet 12   busPIRone  (BUSPAR ) 5 MG tablet TAKE 1 TABLET THREE TIMES DAILY 270 tablet 0   donepezil  (ARICEPT ) 5 MG tablet Take 1 tablet (5 mg total) by mouth at bedtime. 90 tablet 1   levothyroxine  (SYNTHROID ) 75 MCG tablet Take 1 tablet (75 mcg total) by mouth daily before breakfast. 90 tablet 0   memantine  (NAMENDA ) 10 MG tablet TAKE 1 TABLET TWICE DAILY 180 tablet 1   mirtazapine  (REMERON  SOL-TAB) 30 MG disintegrating tablet DISSOLVE 1 TABLET ON THE TONGUE AT BEDTIME 90 tablet 1   olmesartan -hydrochlorothiazide  (BENICAR  HCT) 40-25 MG tablet TAKE 1 TABLET EVERY DAY 90 tablet 1   rosuvastatin  (CRESTOR ) 40 MG tablet Take 1 tablet (40 mg total) by mouth daily. 90 tablet 0   No  current facility-administered medications for this visit.    Allergies  Allergen Reactions   Erythromycin Other (See Comments)    REACTION: GI upset   Penicillins Nausea And Vomiting    Family History  Problem Relation Age of Onset   Diabetes Mother    Stroke Mother    Other Mother        colon sugery   Polymyalgia rheumatica Mother    Memory loss Mother        late 65s   Alcoholism Mother    Memory loss Father    Heart disease Father    Diabetes Father    Dementia Father        unspecified type; late 70s/early 87s   Colon cancer Neg Hx    Cancer Neg Hx    Esophageal cancer Neg Hx    Pancreatic cancer Neg Hx    Stomach cancer Neg Hx     Social History   Socioeconomic History   Marital status: Married    Spouse name: Not on file   Number of children: 2   Years of education: 16   Highest education level: Bachelor's degree (e.g., BA, AB, BS)  Occupational History   Occupation: Retired    Comment: Engineer, site  Tobacco Use   Smoking status: Every Day    Current packs/day: 1.00    Types: Cigarettes   Smokeless tobacco: Never  Vaping Use   Vaping status: Former  Substance and  Sexual Activity   Alcohol use: Not Currently    Comment: occ   Drug use: No   Sexual activity: Not Currently    Birth control/protection: Post-menopausal  Other Topics Concern   Not on file  Social History Narrative   Not on file   Social Drivers of Health   Financial Resource Strain: Not on file  Food Insecurity: Not on file  Transportation Needs: Not on file  Physical Activity: Not on file  Stress: Not on file  Social Connections: Not on file  Intimate Partner Violence: Not on file     Constitutional: Denies fever, malaise, fatigue, headache or abrupt weight changes.  HEENT: Pt reports thinning hair. Denies eye pain, eye redness, ear pain, ringing in the ears, wax buildup, runny nose, nasal congestion, bloody nose, or sore throat. Respiratory: Denies difficulty breathing,  shortness of breath, cough or sputum production.   Cardiovascular: Denies chest pain, chest tightness, palpitations or swelling in the hands or feet.  Gastrointestinal: Patient reports intermittent diarrhea.  Denies abdominal pain, bloating, constipation, or blood in the stool.  GU: Denies urgency, frequency, pain with urination, burning sensation, blood in urine, odor or discharge. Musculoskeletal: Patient reports joint pain.  Denies decrease in range of motion, difficulty with gait, muscle pain or joint swelling.  Skin: Denies redness, rashes, lesions or ulcercations.  Neurological: Patient reports memory loss, intermittent dizziness.  Denies difficulty with speech or problems with balance and coordination.  Psych: Patient has a history of anxiety and depression.  Denies SI/HI.  No other specific complaints in a complete review of systems (except as listed in HPI above).  Objective:   Physical Exam  BP 90/60 (BP Location: Left Arm, Patient Position: Sitting, Cuff Size: Normal)   Ht 4\' 11"  (1.499 m)   Wt 93 lb 6 oz (42.4 kg)   BMI 18.86 kg/m    Wt Readings from Last 3 Encounters:  07/12/23 93 lb 12.8 oz (42.5 kg)  11/22/22 98 lb (44.5 kg)  09/15/22 99 lb (44.9 kg)    General: Appears her stated age, underweight, chronically ill appearing, in NAD. Skin: Warm, dry and intact.  HEENT: Head: normal shape and size; Eyes: sclera white, no icterus, conjunctiva pink, PERRLA and EOMs intact;  Neck:  Neck supple, trachea midline. No masses, lumps or thyromegaly present.  Cardiovascular: Normal rate and rhythm. S1,S2 noted.  No murmur, rubs or gallops noted. No JVD or BLE edema. No carotid bruits noted. Pulmonary/Chest: Normal effort and positive vesicular breath sounds. No respiratory distress. No wheezes, rales or ronchi noted.  Abdomen: Normal bowel sounds.  Musculoskeletal: Generalized muscle wasting noted.  Strength 5/5 BUE/BLE. No difficulty with gait.  Neurological: Alert and  oriented. She has obvious trouble with recall. Cranial nerves II-XII grossly intact. Coordination normal.  Psychiatric: Mood and affect normal. Behavior is normal. Judgment and thought content normal.    BMET    Component Value Date/Time   NA 140 07/12/2023 1513   K 4.1 07/12/2023 1513   CL 105 07/12/2023 1513   CO2 28 07/12/2023 1513   GLUCOSE 85 07/12/2023 1513   BUN 7 07/12/2023 1513   CREATININE 0.75 07/12/2023 1513   CALCIUM  10.0 07/12/2023 1513   GFRNONAA >60 01/01/2020 0405   GFRAA >60 01/01/2020 0405    Lipid Panel     Component Value Date/Time   CHOL 229 (H) 07/12/2023 1513   TRIG 99 07/12/2023 1513   HDL 48 (L) 07/12/2023 1513   CHOLHDL 4.8 07/12/2023 1513  VLDL 33.8 05/13/2019 1228   LDLCALC 160 (H) 07/12/2023 1513    CBC    Component Value Date/Time   WBC 9.4 07/12/2023 1513   RBC 4.23 07/12/2023 1513   HGB 13.7 07/12/2023 1513   HCT 41.1 07/12/2023 1513   PLT 349 07/12/2023 1513   MCV 97.2 07/12/2023 1513   MCH 32.4 07/12/2023 1513   MCHC 33.3 07/12/2023 1513   RDW 12.7 07/12/2023 1513   LYMPHSABS 1.5 12/31/2019 0904   MONOABS 1.0 12/31/2019 0904   EOSABS 0.1 12/31/2019 0904   BASOSABS 0.0 12/31/2019 0904    Hgb A1C Lab Results  Component Value Date   HGBA1C 5.4 07/12/2023           Assessment & Plan:   Preventative Health Maintenance:  Encouraged her to get a flu shot in the fall Tetanus due, advised her if she gets better cut to go get this done Encouraged her to get her COVID booster Pneumovax and Prevnar UTD Discussed Shingrix vaccine, she will check coverage with her insurance company schedule a visit at the pharmacy if she would like to have this done She no longer needs Pap smears She declines lung cancer screening Mammogram and bone density ordered-she will call to schedule Colon screening UTD Encouraged her to consume a balanced diet and exercise regimen Advised her to see an eye doctor and dentist annually We will check  CBC, c-Met, TSH, free T4, lipid, A1c today . Hair thinning:  Could be due to her not taking her levothyroxine  versus alcohol use Will check TSH, free T4, vitamin D , B12 and folate  Dizziness:  Will trial meclizine 25 mg every 8 hours as needed  Family has concerns about patient being able to care for herself.  I am concerned as well.  She has known cognitive impairment, likely complicated by years of alcohol abuse.  MRI brain from 10/2020 reviewed.  I am concerned about her medication compliance.  I do not think that she should be driving.  Will produce a letter stating his fax.  Referral to social work and RN case management placed.  RTC in 6 months, follow-up chronic conditions Helayne Lo, NP

## 2023-10-11 NOTE — Telephone Encounter (Signed)
 Called Holleigh to discuss.  She will update [name] [name]

## 2023-10-11 NOTE — Telephone Encounter (Signed)
 Copied from CRM [id number]. Topic: Appointments - Appointment Info/Confirmation >> Oct 11, 2023  2:21 PM Lizabeth Riggs wrote: Patient/patient representative is calling for information regarding an appointment.  Daughter, Leonette Ramal, is calling to let NP know that mom is not doing good. Her friends are telling daughter that Lyllian cannot take care of her self. Kaytlen is not eating good, not taking medication, drinking more. The family is wanting her to move into a retirement community. Please call  Holleigh  (DAUGHTER) [phone] or Aleda Hurl (Sister) [phone] to discuss any ideas to help to move forward with a retirement community. Also, to see how appointment went today. Thanks

## 2023-10-12 ENCOUNTER — Telehealth: Payer: Self-pay

## 2023-10-12 ENCOUNTER — Ambulatory Visit: Payer: Self-pay | Admitting: Internal Medicine

## 2023-10-12 NOTE — Progress Notes (Signed)
 Complex Care Management Note Care Guide Note  10/12/2023 Name: [name] [name] MRN: [id number] DOB: 1950/08/02   Complex Care Management Outreach Attempts: An unsuccessful telephone outreach was attempted today to offer the patient information about available complex care management services.  Follow Up Plan:  Additional outreach attempts will be made to offer the patient complex care management information and services.   Encounter Outcome:  No Answer  [name] , RMA     [place]  [facility], [facility] Guide  Direct Dial: [phone]  Website: [email or web address]

## 2023-10-19 NOTE — Progress Notes (Signed)
 Complex Care Management Note  Care Guide Note 10/19/2023 Name: [name] [name] MRN: [id number] DOB: [birth year]  [name] [name] is a 73 y.o. year old female who sees [place]irie, Rankin Buzzard, NP for primary care. I reached out to Theona J Rotter by phone today to offer complex care management services.  [name] was given information about Complex Care Management services today including:   The Complex Care Management services include support from the care team which includes your Nurse Care Manager, Clinical Social Worker, or Pharmacist.  The Complex Care Management team is here to help remove barriers to the health concerns and goals most important to you. Complex Care Management services are voluntary, and the patient may decline or stop services at any time by request to their care team member.   Complex Care Management Consent Status: Patient agreed to services and verbal consent obtained.   Follow up plan:  Telephone appointment with complex care management team member scheduled for:  Lcsw 10/24/2023 RNCM 10/25/2023  Encounter Outcome:  Patient Scheduled  [name] , RMA     [place]  [facility], [facility] Guide  Direct Dial: [phone]  Website: [email or web address]

## 2023-10-19 NOTE — Progress Notes (Signed)
 Complex Care Management Note Care Guide Note  10/19/2023 Name: [name] [name] MRN: [id number] DOB: [birth year]   Complex Care Management Outreach Attempts: A second unsuccessful outreach was attempted today to offer the patient with information about available complex care management services.  Follow Up Plan:  Additional outreach attempts will be made to offer the patient complex care management information and services.   Encounter Outcome:  No Answer  [name] , RMA     [place]  [facility], [facility] Guide  Direct Dial: [phone]  Website: [email or web address]

## 2023-10-24 ENCOUNTER — Telehealth: Admitting: Licensed Clinical Social Worker

## 2023-10-25 ENCOUNTER — Other Ambulatory Visit: Payer: Self-pay

## 2023-10-25 ENCOUNTER — Telehealth: Payer: Self-pay

## 2023-10-25 MED ORDER — ROSUVASTATIN CALCIUM 40 MG PO TABS: 40.0000 mg | ORAL_TABLET | Freq: Every day | ORAL | 0 refills | Status: DC

## 2023-10-25 NOTE — Telephone Encounter (Signed)
 Copied from CRM [id number]. Topic: Clinical - Medication Question >> Oct 25, 2023  2:49 PM Alpha Arts wrote:  Reason for CRM: Nurse case manager for [place] has some concerns about medication  - would like to run it by Helayne Lo NP  Callback #: [phone]  Medications: levothyroxine  (SYNTHROID ) 75 MCG tablet - Patient is taking instead of 75 MCG. Need clarification  olmesartan -hydrochlorothiazide  (BENICAR  HCT) 40-25 MG tablet. Should patient be taking whole or half of tablet?   rosuvastatin  (CRESTOR ) 40 MG tablet-Patient is taking 20 MG but according to the med list, it says 40 MG. Need clarification

## 2023-10-25 NOTE — Telephone Encounter (Signed)
 Her last LDL was elevated and I recommended she increase her rosuvastatin  to 40 mg daily.  Her last TSH and free T4 is normal so what ever dose of levothyroxine  she is currently taking, I would recommend she continue that at this time.  As far as her blood pressure goes, her last BP was significantly low on amlodipine  10 mg, olmesartan  HCT 40-25 mg.  What is she currently taking as far as blood pressure medication?

## 2023-10-25 NOTE — Telephone Encounter (Signed)
 Copied from CRM [id number]. Topic: General - Other >> Oct 25, 2023 11:38 AM Albertha Alosa wrote: Reason for CRM: nurse case manager [place] has some concerns about medication  - would like to run it by Helayne Lo  [phone] Medication that was questioning  levothyroxine  (SYNTHROID ) 75 MCG tablet   olmesartan -hydrochlorothiazide  (BENICAR  HCT) 40-25 MG tablet

## 2023-10-25 NOTE — Telephone Encounter (Signed)
 Spoke with case Production designer, theatre/television/film to clarify per [place]. Patient states she has been taking the 50 mcg of the levothyroxine , so will continue with that dose since labs were good. Sent in a new Rx for the Rosuvastatin  40 mg. Not sure what dose or which medication has been taking for BP. Will verify that and advise that she monitor BP at home. No further questions or concerns at this time.

## 2023-10-25 NOTE — Patient Outreach (Signed)
 Complex Care Management   Visit Note  10/25/2023  Name:  [name] [name] MRN: [id number] DOB: Mar 19, 1951  Situation: Referral received for Complex Care Management related to not thriving at home I obtained verbal consent from Patient POA.  Visit completed with sister, Aleda Hurl, and patient, [name] [name]  on the phone.  Sister states her concerns are that patient is not properly caring for herself, she is underweight, feels she isn't taking her medications as prescribed, friends at the [facility] have reported to [place] that patient isn't bathing properly and wearing soiled clothes.  Patient does have a caregiver at the [facility] home that visits to help fill pillbox and checks on her but patient is currently at [place], [place] home and has no caregiver.  There is a person who will check in on her to make sure she is well while patient is cleaning up the home to get ready to sell.    During medication reconciliation, this RNCM made note of discrepancies: patient states she is taking (1) levothyroxine  50mcg 1/2 tab once in the AM (med list lists 75mg  1 tab once in the AM), (2) olmesartan -hydrochlorothiazide  40-25mg  once daily (per office visit note 10/11/23, should be taking 1/2 tablet), (3) rosuvastatin  20mg  once at night (per office visit note 10/11/23 should be taking 40mg  once at night).    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 She has not received the med as of yet.)    HEENT HEENT Symptoms Reported: No symptoms reported      Cardiovascular Does patient have uncontrolled Hypertension?: No Cardiovascular Conditions: Hypertension, High blood cholesterol Cardiovascular Management Strategies: Medication therapy  Respiratory Respiratory Symptoms Reported: No symptoms reported    Endocrine Patient reports the following symptoms related to hypoglycemia or hyperglycemia : No symptoms reported Endocrine Conditions: Thyroid  disorder Endocrine Self-Management Outcome: 4 (good)  Gastrointestinal Gastrointestinal Symptoms Reported: No symptoms reported      Genitourinary Genitourinary Symptoms Reported: No symptoms reported    Integumentary Integumentary Symptoms Reported: No symptoms reported    Musculoskeletal Musculoskelatal Symptoms Reviewed: No symptoms reported        Psychosocial Psychosocial Symptoms Reported: No symptoms reported     Quality of Family Relationships: helpful, supportive, involved Do you feel physically threatened by others?: No      10/11/2023    2:29 PM  Depression screen PHQ 2/9  Decreased Interest 0  Down, Depressed, Hopeless 0  PHQ - 2 Score 0  Altered sleeping 0  Tired, decreased energy 0  Change in appetite 0  Feeling bad or failure about yourself  0  Trouble concentrating 0  Moving slowly or fidgety/restless 0  Suicidal thoughts 0  PHQ-9 Score 0  Difficult doing work/chores Not difficult at all    There were no vitals filed for this visit.  Medications Reviewed Today     Reviewed by Isadore Marble, RN (Registered Nurse) on 10/25/23 at 1047  Med List  Status: <None>   Medication Order Taking? Sig Documenting Provider Last Dose Status Informant  amLODipine  (NORVASC ) 10 MG tablet [id number] Yes TAKE 1 TABLET(10 MG) BY MOUTH DAILY Carollynn Cirri, NP  Active   aspirin  81 MG EC tablet [id number] Yes Take 1 tablet (81 mg total) by mouth daily. Swallow whole. Carollynn Cirri, NP  Active   busPIRone  (BUSPAR ) 5 MG tablet [id number] Yes TAKE 1 TABLET THREE TIMES DAILY Baity, Rankin Buzzard, NP  Active   donepezil  (ARICEPT ) 5 MG tablet [id number] Yes Take 1 tablet (5 mg total) by mouth at bedtime. Carollynn Cirri, NP  Active   levothyroxine  (SYNTHROID ) 75 MCG tablet [id number] Yes Take 1 tablet (75 mcg total) by mouth daily before breakfast. Carollynn Cirri, NP  Active   meclizine  (ANTIVERT ) 25 MG tablet [id number]  Take 1 tablet (25 mg total) by mouth 3 (three) times daily as needed for dizziness. Carollynn Cirri, NP  Active   memantine  (NAMENDA ) 10 MG tablet [id number] Yes TAKE 1 TABLET TWICE DAILY Carollynn Cirri, NP  Active   mirtazapine  (REMERON  SOL-TAB) 30 MG disintegrating tablet [id number] Yes DISSOLVE 1 TABLET ON THE TONGUE AT BEDTIME Carollynn Cirri, NP  Active   olmesartan -hydrochlorothiazide  (BENICAR  HCT) 40-25 MG tablet [id number] Yes TAKE 1 TABLET EVERY DAY Baity, Rankin Buzzard, NP  Active   rosuvastatin  (CRESTOR ) 40 MG tablet [id number] Yes Take 1 tablet (40 mg total) by mouth daily. Carollynn Cirri, NP  Active             Recommendation:   This RNCM called and left message with [facility] to inform of medication discrepancies.   Patient and sister, Aleda Hurl will have phone assessment with LCSW tomorrow.    Follow Up Plan:   Telephone follow-up two days.   Jurline Olmsted BSN, CCM [place]  VBCI Population Health RN Care Manager Direct Dial: [phone]  Fax: [phone]

## 2023-10-25 NOTE — Telephone Encounter (Signed)
 Returned call to listed number. LM to call back to get further information before passing message over to [place]? Wanted to know specific questions that Case Manager had about those medications. Okay to get information from Case Manager if she calls back.

## 2023-10-25 NOTE — Patient Instructions (Signed)
 Visit Information  Thank you for taking time to visit with me today. Please don't hesitate to contact me if I can be of assistance to you before our next scheduled appointment.  Our next appointment is by telephone on Thursday, June 26th at 10am Please call the care guide team at [phone] if you need to cancel or reschedule your appointment.   Following is a copy of your care plan:   Goals Addressed             This Visit's Progress    VBCI RN Care Plan       Problems:  Chronic Disease Management support and education needs related to Medication Management  Goal: Over the next 15 days the Patient will take all medications exactly as prescribed and will call provider for medication related questions as evidenced by able to verbalize correct dose of levothyroxine , rosuvastatin , and olmesartan -hydrochlorothiazide .      Interventions:   Evaluation of current treatment plan related to medication management, Limited social support and Memory Deficits self-management and patient's adherence to plan as established by provider. Discussed plans with patient for ongoing care management follow up and provided patient with direct contact information for care management team Evaluation of current treatment plan related to current medication list and patient's adherence to plan as established by provider Reviewed medications with patient and discussed levothyroxine , rosuvastatin , and olmesartan -hydrochlorothiazide  dosing  Collaborated with office of Helayne Lo, NP at [facility] regarding correct dosing of levothyroxine , rosuvastatin , and olmesartan -hydrochlorothiazide . Left message for return call with their office.  Assessed social determinant of health barriers  Patient Self-Care Activities:  Take medications as prescribed   Continue filling pillbox weekly.   Plan:  The care management team will reach out to the patient again over the next two days.              Please call the [place]  National Suicide Prevention Lifeline: (810) 495-5289 or TTY: 952-666-7465 TTY (505)522-8121) to talk to a trained counselor if you are experiencing a Mental Health or Behavioral Health Crisis or need someone to talk to.  Patient verbalizes understanding of instructions and care plan provided today and agrees to view in MyChart. Active MyChart status and patient understanding of how to access instructions and care plan via MyChart confirmed with patient.     [name] [name] BSN, CCM [place]  VBCI Population Health RN Care Manager Direct Dial: [phone]  Fax: [phone]

## 2023-10-26 ENCOUNTER — Other Ambulatory Visit: Payer: Self-pay | Admitting: Licensed Clinical Social Worker

## 2023-10-26 NOTE — Patient Outreach (Signed)
 Complex Care Management   Visit Note  10/26/2023  Name:  [name] [name] MRN: [id number] DOB: Oct 23, 1950  Situation: Referral received for Complex Care Management related to SDOH Barriers:  alcohol use I obtained verbal consent from Patient.  Visit completed with Para Bold  on the phone [name] reports she does not excessive drink alcohol and takes care of herself appropriately. Conference call was completed with J Kuznicki and Baxter Bott poa residing in [place]. Ms. Standifer reports she does not want anyone helping her in the home and she is managing her healthcare.  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 Additionally did not complete assessment due to patient reports non-interest in ccm and Ms. Oliveria and sister Ms. Drury interaction. SW used best judgement and determined not to complete assessment   Cognitive        Neurological      HEENT        Cardiovascular      Respiratory      Endocrine      Gastrointestinal        Genitourinary      Integumentary      Musculoskeletal          Psychosocial       Quality of Family Relationships: involved Do you feel physically threatened by others?: No      10/11/2023    2:29 PM  Depression screen PHQ 2/9  Decreased Interest 0  Down, Depressed, Hopeless 0  PHQ - 2 Score 0  Altered sleeping 0  Tired, decreased energy 0  Change in appetite 0  Feeling bad or failure about yourself  0  Trouble concentrating 0  Moving slowly or fidgety/restless 0   Suicidal thoughts 0  PHQ-9 Score 0  Difficult doing work/chores Not difficult at all    There were no vitals filed for this visit.  Medications Reviewed Today     Reviewed by Fletcher Nidiffer, LCSW (Social Worker) on 10/26/23 at 2301  Med List Status: <None>   Medication Order Taking? Sig Documenting Provider Last Dose Status Informant  amLODipine  (NORVASC ) 10 MG tablet [id number]  TAKE 1 TABLET(10 MG) BY MOUTH DAILY Carollynn Cirri, NP  Active   aspirin  81 MG EC tablet [id number]  Take 1 tablet (81 mg total) by mouth daily. Swallow whole. Carollynn Cirri, NP  Active   busPIRone  (BUSPAR ) 5 MG tablet [id number]  TAKE 1 TABLET THREE TIMES DAILY Baity, Rankin Buzzard, NP  Active   donepezil  (ARICEPT ) 5 MG tablet [id number]  Take 1 tablet (5 mg total) by mouth at bedtime. Carollynn Cirri, NP  Active   levothyroxine  (SYNTHROID ) 75 MCG tablet [id number]  Take 1 tablet (75 mcg total) by mouth daily before breakfast. Carollynn Cirri, NP  Active   meclizine  (ANTIVERT ) 25 MG tablet [id number]  Take 1 tablet (25 mg total) by mouth 3 (three) times daily as needed for dizziness. Carollynn Cirri, NP  Active   memantine  (NAMENDA ) 10 MG tablet [id number]  TAKE 1 TABLET TWICE DAILY Helayne Lo  W, NP  Active   mirtazapine  (REMERON  SOL-TAB) 30 MG disintegrating tablet [id number]  DISSOLVE 1 TABLET ON THE TONGUE AT BEDTIME Carollynn Cirri, NP  Active   olmesartan -hydrochlorothiazide  (BENICAR  HCT) 40-25 MG tablet [id number]  TAKE 1 TABLET EVERY DAY Baity, Rankin Buzzard, NP  Active   rosuvastatin  (CRESTOR ) 40 MG tablet [id number]  Take 1 tablet (40 mg total) by mouth daily. Carollynn Cirri, NP  Active             Recommendation:   Continue Current Plan of Care  Follow Up Plan:   Closing From:  Complex Care Management  Fletcher Deisher MSW, LCSW Licensed Clinical Social Worker  [facility], Population Health Direct Dial: [phone]  Fax: [phone]

## 2023-10-26 NOTE — Patient Instructions (Signed)
 Visit Information  Thank you for taking time to visit with me today. Please don't hesitate to contact me if I can be of assistance to you before our next scheduled appointment.  Your next care management appointment is 11/08/2023  at 10am  Telephone follow up appointment date/time:  11/08/23 at 10am  Please call the care guide team at [phone] if you need to cancel, schedule, or reschedule an appointment.   Please call 911 if you are experiencing a Mental Health or Behavioral Health Crisis or need someone to talk to.  Fletcher Kuster MSW, LCSW Licensed Clinical Social Worker  [facility], Population Health Direct Dial: [phone]  Fax: [phone]

## 2023-11-08 ENCOUNTER — Other Ambulatory Visit: Payer: Self-pay

## 2023-11-08 NOTE — Patient Outreach (Signed)
 Complex Care Management   Visit Note  11/08/2023  Name:  [name] [name] MRN: [id number] DOB: Jul 25, 1950  Situation: Referral received for Complex Care Management related to not thriving at home. I obtained verbal consent from Caregiver Patient.  Visit completed with Ms. 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      Neurological Neurological Review of Symptoms: Dizziness Neurological Comment: She still has not received Meclizine  from [facility], She will check guard office that receives packages, sister will call [facility] if [name] [name] hasn't received.  HEENT HEENT Symptoms Reported: Not assessed      Cardiovascular Cardiovascular Symptoms Reported: No symptoms reported    Respiratory Respiratory Symptoms Reported: No symptoms reported    Endocrine Patient reports the following symptoms related to hypoglycemia or hyperglycemia : No symptoms reported Endocrine Comment: Does not take levothryoxine by itself due to she may forget to take it completely.  Sister, [name], suggested she place the bottle in her bathroom and take it first thing in the morning. We discussed it's best to take before food/drink/meds but taking  with other meds is better than not taking it all.  Gastrointestinal Gastrointestinal Symptoms Reported: No symptoms reported      Genitourinary Genitourinary Symptoms Reported: No symptoms reported    Integumentary Integumentary Symptoms Reported: Not assessed    Musculoskeletal Musculoskelatal Symptoms Reviewed: No symptoms reported   Falls in the past year?: No    Psychosocial              10/11/2023    2:29 PM  Depression screen PHQ 2/9  Decreased Interest 0  Down, Depressed, Hopeless 0  PHQ - 2 Score 0  Altered sleeping 0  Tired, decreased energy 0  Change in appetite 0  Feeling bad or failure about yourself  0  Trouble concentrating 0  Moving slowly or fidgety/restless 0  Suicidal thoughts 0  PHQ-9 Score 0  Difficult doing work/chores Not difficult at all    There were no vitals filed for this visit.  Medications Reviewed Today   Medications were not reviewed in this encounter     Recommendation:   -Sister will hire caregiver to check in on Ms. [name] [name] starting one day a week to monitor medications, make sure she is taking as prescribed.   Patient does not want to go into assisted living at this time. Discussed purchasing Gatorade G2 for lower sugar intake, plans meals with more fish and chicken, limiting red meat.  -Sister will look for closet urgent care in [place] or [place] due to patient is staying at her home in [place] instead of [place].  Patient plans on keeping [name] Laura, NP as her provider.  -Sister and brother to Ms. [name] [name] will be visiting end of July to discuss car situation - patient has  been told by health care provider not to drive and the only thing family can do is send patient for driver's course at [facility] or go ahead and take away keys and car.     Follow Up Plan:   Telephone follow-up in 1 month  [name] [name] BSN, CCM [place]  VBCI Population Health RN Care Manager Direct Dial: [phone]  Fax: [phone]

## 2023-11-08 NOTE — Patient Instructions (Signed)
 Visit Information  Thank you for taking time to visit with me today. Please don't hesitate to contact me if I can be of assistance to you before our next scheduled appointment.  Your next care management appointment is by telephone on Thursday, July 24th at 3:45pm.    Please call the care guide team at [phone] if you need to cancel, schedule, or reschedule an appointment.   A reminder to ALL patients/family/friends, please call the [place]  National Suicide Prevention Lifeline: (380)521-4580 or TTY: (249)399-6578 TTY 401-563-4307) to talk to a trained counselor if you are experiencing a Mental Health or Behavioral Health Crisis or need someone to talk to.  Santana Stamp BSN, CCM [place]  VBCI Population Health RN Care Manager Direct Dial: [phone]  Fax: [phone]

## 2023-11-20 ENCOUNTER — Ambulatory Visit: Payer: Self-pay

## 2023-11-20 NOTE — Telephone Encounter (Signed)
 FYI Only or Action Required?: Action required by provider: medication refill request and update on patient condition.  Patient was last seen in primary care on 10/11/2023 by Antonette Angeline ORN, NP.  Called Nurse Triage reporting Dizziness.  Symptoms began about a month ago.  Interventions attempted: Rest, hydration, or home remedies.  Symptoms are: unchanged.  Triage Disposition: See PCP When Office is Open (Within 3 Days)  Patient/caregiver understands and will follow disposition?: No  Copied from CRM [id number]. Topic: Clinical - Red Word Triage >> Nov 20, 2023  4:26 PM Tobias L wrote: Red Word that prompted transfer to Nurse Triage: patient dizzy and has vertigo Reason for Disposition  [1] MODERATE dizziness (e.g., vertigo; feels very unsteady, interferes with normal activities) AND [2] has been evaluated by doctor (or NP/PA) for this  Answer Assessment - Initial Assessment Questions 1. DESCRIPTION: Describe your dizziness.     Vertigo and lightheaded  2. VERTIGO: Do you feel like either you or the room is spinning or tilting?      Yes 3. LIGHTHEADED: Do you feel lightheaded? (e.g., somewhat faint, woozy, weak upon standing)     yes 4. SEVERITY: How bad is it?  Can you walk?   - MILD: Feels slightly dizzy and unsteady, but is walking normally.   - MODERATE: Feels unsteady when walking, but not falling; interferes with normal activities (e.g., school, work).   - SEVERE: Unable to walk without falling, or requires assistance to walk without falling.     Moderate  5. ONSET:  When did the dizziness begin?     Vertigo X 1 month 6. AGGRAVATING FACTORS: Does anything make it worse? (e.g., standing, change in head position)     Denies. Laying down is somewhat helpful 7. CAUSE: What do you think is causing the dizziness?     unsure 8. RECURRENT SYMPTOM: Have you had dizziness before? If Yes, ask: When was the last time? What happened that time?     Yes, X 1 month 9.  OTHER SYMPTOMS: Do you have any other symptoms? (e.g., headache, weakness, numbness, vomiting, earache)     Denies  Additional info: 1) Sister [name] is calling with concern for continued vertigo and dizziness, she was prescribed meclizine  but did not take any and now lost bottle. [name] had suggested for Davanee to go to urgent care but she refused this so [name] is requesting advice and follow up appointment to explore what is causing her vertigo daily. No acute appointments available in office for two weeks.   2) Plan: Request refill of meclizine . [name] will attempt again to have patient go to urgent care, if she refuses and symptoms progress [name] will call for ambulance. Will call back with any questions or additional concerns.  Protocols used: Dizziness - Vertigo-A-AH

## 2023-12-06 ENCOUNTER — Other Ambulatory Visit: Payer: Self-pay

## 2023-12-07 ENCOUNTER — Other Ambulatory Visit

## 2023-12-07 NOTE — Patient Instructions (Signed)
 Visit Information  Thank you for taking time to visit with me today. Please don't hesitate to contact me if I can be of assistance to you before our next scheduled appointment.  Your next care management appointment is by telephone on Wednesday, July 30th at 12:30pm.    Please call the care guide team at [phone] if you need to cancel, schedule, or reschedule an appointment.   A reminder to ALL patients/family/friends, please call the [place]  National Suicide Prevention Lifeline: 725-850-9249 or TTY: 312-582-0271 TTY 303 101 6054) to talk to a trained counselor if you are experiencing a Mental Health or Behavioral Health Crisis or need someone to talk to.  Santana Stamp BSN, CCM [place]  VBCI Population Health RN Care Manager Direct Dial: [phone]  Fax: [phone]

## 2023-12-07 NOTE — Patient Outreach (Addendum)
 Complex Care Management   Visit Note  12/07/2023  Name:  [name] [name] MRN: [id number] DOB: 08-24-1950  Situation: Referral received for Complex Care Management related to Not Thriving at Home. I obtained verbal consent from Caregiver and patient.  Visit completed with [name] (sister) and Madison Ao by conference call  on the phone. Main concern is intermittent dizziness and weakness, sister believes patient is not taking meds as prescribed, and not eating regular meals.  [name] (sister) lives in [place], patient's daughter lives in [place], son lives in [place]. Patient is currently living at [facility] in a [place] and not permitted to drive at this time due to mild cognitive impairment.   She does have two friends who check in on her almost daily with no set schedule.   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  [name] states neighbors report patient doesn't appear to be eating well, appears to have lost weight.  [name] is visiting patient on 12/10/23 from [place].)      Neurological Neurological Review of Symptoms: Dizziness Neurological Comment: According to patient, she still has not received Meclizine  from [facility].  [name] (sister) will call [facility] to see if it has been shipped.  HEENT HEENT Symptoms Reported: No symptoms reported      Cardiovascular  Cardiovascular Symptoms Reported: No symptoms reported    Respiratory Respiratory Symptoms Reported: No symptoms reported    Endocrine Endocrine Symptoms Reported: No symptoms reported    Gastrointestinal Additional Gastrointestinal Details: Neighbors report to sister, [name], that patient appears to have lost weight.  Patient denies this statement but unable to provide current weight.      Genitourinary Genitourinary Symptoms Reported: No symptoms reported    Integumentary Integumentary Symptoms Reported: No symptoms reported    Musculoskeletal Musculoskelatal Symptoms Reviewed: Unsteady gait        Psychosocial Psychosocial Symptoms Reported: Alteration in eating habits, Avoiding people, places, activities Additional Psychological Details: Per neighbors, patient isn't eating like she should. She is refusing to go to Urgent Care when offered.  EMS was called a couple of weeks ago and patient refused to go to ED due to dizziness.  Patient is now living in [place] and not able to drive to PCP office in [place] for appointments at this time.            10/11/2023    2:29 PM  Depression screen PHQ 2/9  Decreased Interest 0  Down, Depressed, Hopeless 0  PHQ - 2 Score 0  Altered sleeping 0  Tired, decreased energy 0  Change in appetite 0  Feeling bad or failure about yourself  0  Trouble concentrating 0  Moving slowly or fidgety/restless 0  Suicidal thoughts 0  PHQ-9 Score 0  Difficult doing work/chores Not difficult at all    There were no vitals filed for this visit.  Medications Reviewed Today   Medications were not  reviewed in this encounter     Recommendation:   -This RNCM discussed using Urgent Care for health needs until she can get established with PCP in [facility] if she plans on staying there.     -Today, sister is calling the [facility] friends [place] and [facility]) to drive patient to urgent care for assessment due to dizziness.   -Discussed calling EMS if  patient has a fall or is unable to get out of bed or continues to complain of dizziness.   Follow Up Plan:   Telephone follow up in one week after [name] (sister) visits patient on 7/28   [facility] BSN, CCM   [facility] RN Care Manager Direct Dial: [phone]  Fax: [phone]

## 2023-12-07 NOTE — Progress Notes (Signed)
 Case Management Initial Assessment Note Patient Information: DOB:12/05/1950 Gender:female Admission Date: 12/06/2023 Primary Care Provider: Angeline Laura, NP Preferred Pharmacy: No Pharmacies Listed  Unplanned Readmission Score:  10.58  Assessment discussed with:  Name: Elexius Minar Relationship to Patient: Self Phone Number:See facesheet Date: Fri 12/07/2023  Assessment conducted via: Bedside  Payor Verification: Payor verified and no changes needed   Extended Emergency Contact Information Primary Emergency Contact: Drury,Jill Mobile Phone: [phone] Relation: Sister Guardian Information: N/A  Current Agencies or Facilities:  ,  ,   Anticipated Discharge Plan:  7/25: CCM verified pt information, address, PCP, emergency contact, and insurance. PTA, pt reports living alone in an RV at [facility] in [place], [place]. Pt reports falling in the last 30 days. Pt reports being able to drive. Pt denies receiving any HH services or the use of any DME. Pt reports being able to afford F/M. 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 Pt reports that upon d/c, her ex-husband will transport her back home. Pt reports pharmacy is [facility] in [place], [place]. Pt reports that [facility] mails her prescriptions. No current CCM needs. CCM will continue to follow patient.   Anticipated Discharge Location: Home  If Plan A discharging location is not feasible: Potential Plan B: Home Living Assessment: Discharge Planning Home Living  Assessment  Prior Living Situation: Mobile Mobile Home: RV/Camper Lives With: Alone Home Support or Services : Independent/No support Barriers at Home : No Barriers  Home Social Assessment: In anticipation of future discharge needs the patient/caregiver indicates: Do you have a way to obtain medications after you leave the hospital? : Yes Do you have a way to pay for medications after you leave the hospital? : Yes Do you have a good supply of food at home? : Yes Do you  have a ride to doctors appointment after you leave the hospital? : Yes  Readmission Assessment:                                            Anticipated Discharge Needs Estimated Date of Discharge: Dec 08, 2023     Home Support or Services : Independent/No support Can be cared for in same environment prior to hospitalization: Yes            Discharge Barriers Discharge Barriers Identified  : No Barriers Identified  Assessment Completed by: Araceli Mondragon, MSW

## 2023-12-08 NOTE — Progress Notes (Signed)
 Case Management Quick Note Patient Information: DOB:11-23-1950 Gender:female Admission Date: 12/06/2023  Discharge Barriers Discharge Barriers Identified  : No Barriers Identified      Notes: Patient transferred to [facility]   Anticipated Discharge Location: To be determined Assessment Completed by: Erminio Amend, RN

## 2023-12-08 NOTE — Discharge Summary (Signed)
 Hospital Medicine Discharge Summary   Demographics: [name] [name] y.o. [birth year] MRN: [id number]    Extended Emergency Contact Information Primary Emergency Contact: Drury,Jill Mobile Phone: [phone] Relation: Sister  Full Code  Admit Date: 12/06/2023                            Attending Physician: Elspeth Horde, MD Discharge Date: 12/08/2023  Primary Care Provider: Angeline Laura, NP   [phone]  Consults during this admission: Consult Orders     None       Active & Resolved Diagnosis: Principal Problem (Resolved):   AKI (acute kidney injury) Active Problems:   Hypothyroidism   Dizziness   Falls   Malnutrition (CMD)   Smoker   Bradycardia Resolved Problems:   Hypotension   Dehydration   Acute kidney injury   Urinary tract infection   Disposition: Patient discharged to Acute hospital facility in stable condition.   Discharge follow-up recommendations : See Hospital Course    Hospital Course: [name] [name] is a 73 y.o. female with history of hypertension on amlodipine  and olmesartan  and HCTZ, hypothyroidism and mild cognitive decline, admitted to [facility] 7/24 due to dizziness leading to falls.  This has been going on for about 2 weeks.    The patient came in hypotensive 80s-90s with HR 60s - 70s and AKI. She got at least 3L IVF bolus with no significant changes to her BP or symptoms. Her TTE was unremarkable; normal with LVEF of 64% (+/-5%) by 3D. No segmental/regional wall motion abnormalities identified. There is  normal left ventricular diastolic function.  The left ventricular cavity  size is normal with normal wall thickness.   On 7/25 she was noticed to have brady to 40s on tele and on 7/26 persistent brady with worsening sBP to 70s. Case was discussed with [facility] and EP at cab, patient was accepted. She was started on midodrine with improvement in her sBP to 100 with MAP >100. EKG with junctional brady, hence [facility] rec IP EP eval. CHG at [facility]  wanted discussion with critical care to determine need for epi ggt/icu transfer prior to leaving stanly and given improved MAPs >65, rec is to send her over to PCCU at [facility].  Dr. Cody with ICU QB assisted in coordinating care disposition.              Wound / Incision Assessment: Refer to Chart Review and Media Tab for images if available.      Temp:  [98.1 F (36.7 C)-98.7 F (37.1 C)] 98.4 F (36.9 C) Heart Rate:  [40-51] 40 Resp:  [16-18] 17 BP: (78-110)/(39-71) 102/58      Discharge Medications     Unreviewed Medications      Sig Disp Refill Start End  amLODIPine  10 mg tablet Commonly known as: NORVASC   Take 10 mg by mouth daily.   0     busPIRone  5 mg tablet Commonly known as: BUSPAR   Take 5 mg by mouth in the morning and 5 mg at noon and 5 mg in the evening.   0     donepeziL  5 mg tablet Commonly known as: ARICEPT   Take 5 mg by mouth at bedtime.   0     levothyroxine  75 mcg tablet Commonly known as: SYNTHROID   Take 75 mcg by mouth every morning.   0     meclizine  25 mg tablet Commonly known as: ANTIVERT   Take 25 mg  by mouth 3 (three) times a day as needed for dizziness.   0     memantine  10 mg tablet Commonly known as: NAMENDA   Take 10 mg by mouth 2 (two) times a day.   0     mirtazapine  30 mg disintegrating tablet Commonly known as: REMERON  SOLTAB  Dissolve 30 mg on tongue at bedtime.   0     olmesartan -hydroCHLOROthiazide  40-25 mg per tablet Commonly known as: BENICAR  HCT  Take 1 tablet by mouth daily.   0     rosuvastatin  40 mg tablet Commonly known as: CRESTOR   Take 40 mg by mouth daily.   0             Lab Results  Component Value Date/Time   HGB 12.0 12/06/2023 06:27 PM   HCT 35 12/06/2023 06:27 PM   WBC 9.81 12/06/2023 06:27 PM   PLT 227 12/06/2023 06:27 PM   Lab Results  Component Value Date/Time   NA 142 12/08/2023 10:22 AM   K 3.4 (L) 12/08/2023 10:22 AM   CREATININE 1.45 (H) 12/08/2023 10:22 AM   BUN  31 (H) 12/08/2023 10:22 AM   GLUCOSE 95 12/08/2023 10:22 AM    Pertinent Imaging: CT Head WO Contrast  Final Result by Lonni Lynwood Pheasant, MD (07/24 1842)  DATE OF SERVICE:  12/06/2023 6:19 pm    EXAM:  CT HEAD WO CONTRAST; CT SPINE CERVICAL WO CONTRAST    CLINICAL HISTORY:  Mental status change, unknown cause; Bone lesion, cervical spine,   incidental    COMPARISON:  No comparisons    0 prior CTs in the past 12 months.    TECHNIQUE:  Axial helical images through the brain from the vertex to the skull base,   as well as axial helical images through the cervical spine. Cervical spine   images are reviewed on multiplanar reformatted images.    One or more of the following does reduction techniques were utilized:   Automated exposure control; adjustment of the mA and/or kv according to   patient size; use of iterative reconstruction technique    FINDINGS:  Calvarium/Skull Base: No calvarial fracture.  Paranasal Sinuses: The paranasal sinuses and mastoids are clear.  Brain: No evidence for acute large vascular territory infarct, hemorrhage,   hydrocephalus, or mass effect. Mild brain parenchymal volume loss and mild   white matter changes.    Alignment: Trace degenerative retrolisthesis of C5 on C6.  Craniocervical junction: No evidence of acute fracture.  Vertebrae: No acute fracture.  Degenerative changes: Multilevel cervical spondylosis contributing to   probable mild spinal canal stenosis at C5-6 as well as at least moderate   bilateral neural foraminal stenosis at C5-6.    Centrilobular emphysema in the lung apices.    IMPRESSION:  1. No evidence for acute intracranial abnormality.  2. No acute fracture or malalignment of the cervical spine.    THIS IS AN ELECTRONICALLY VERIFIED FINAL REPORT  12/06/2023 6:42 PM - Electronically signed by Lonni Pheasant   Workstation: [id number]  [facility]    CT Spine Cervical WO Contrast  Final Result by Lonni Lynwood Pheasant, MD (07/24 1842)  DATE OF SERVICE:  12/06/2023 6:19 pm    EXAM:  CT HEAD WO CONTRAST; CT SPINE CERVICAL WO CONTRAST    CLINICAL HISTORY:  Mental status change, unknown cause; Bone lesion, cervical spine,   incidental    COMPARISON:  No comparisons    0 prior CTs in the past 12 months.  TECHNIQUE:  Axial helical images through the brain from the vertex to the skull base,   as well as axial helical images through the cervical spine. Cervical spine   images are reviewed on multiplanar reformatted images.    One or more of the following does reduction techniques were utilized:   Automated exposure control; adjustment of the mA and/or kv according to   patient size; use of iterative reconstruction technique    FINDINGS:  Calvarium/Skull Base: No calvarial fracture.  Paranasal Sinuses: The paranasal sinuses and mastoids are clear.  Brain: No evidence for acute large vascular territory infarct, hemorrhage,   hydrocephalus, or mass effect. Mild brain parenchymal volume loss and mild   white matter changes.    Alignment: Trace degenerative retrolisthesis of C5 on C6.  Craniocervical junction: No evidence of acute fracture.  Vertebrae: No acute fracture.  Degenerative changes: Multilevel cervical spondylosis contributing to   probable mild spinal canal stenosis at C5-6 as well as at least moderate   bilateral neural foraminal stenosis at C5-6.    Centrilobular emphysema in the lung apices.    IMPRESSION:  1. No evidence for acute intracranial abnormality.  2. No acute fracture or malalignment of the cervical spine.    THIS IS AN ELECTRONICALLY VERIFIED FINAL REPORT  12/06/2023 6:42 PM - Electronically signed by Lonni Pheasant   Workstation: [id number]  [facility]      Electronically signed by: Elspeth Horde, MD 12/08/2023 12:56 PM   Time spent on discharge: 45 minutes

## 2023-12-10 NOTE — Telephone Encounter (Signed)
Scheduled 8/5

## 2023-12-10 NOTE — Telephone Encounter (Signed)
 [facility] arrange 1 week device f/u s/p ppm with [facility] today.

## 2023-12-10 NOTE — Progress Notes (Signed)
 Case Management Quick Note Patient Information: DOB:August 18, 1950 Gender:female Admission Date: 12/08/2023  Discharge Barriers Discharge Barriers Identified  : No Barriers Identified      Notes:  CCM sent consult for Spiritual Care and also PT/OT consult.    Anticipated Discharge Location: To be determined Assessment Completed by: Bernard Guan, BSW

## 2023-12-11 NOTE — Assessment & Plan Note (Signed)
 Urine culture mixed growth.  Antibiotics discontinued

## 2023-12-11 NOTE — Care Plan (Signed)
  Problem: Risk for Fall-High-IP Goal: Patient will remain free of fall during hospital stay-IP Outcome: Progressing   Problem: Knowledge Deficit Goal: Patient/family/caregiver demonstrates understanding of disease process, treatment plan, medications, and discharge instructions Description: Complete learning assessment and assess knowledge base. Outcome: Progressing   Problem: Compromised Skin Integrity Goal: Skin integrity is maintained or improved Description: Assess and monitor skin integrity. Identify patients at risk for skin breakdown on admission and per policy. Collaborate with interdisciplinary team and initiate plans and interventions as needed.    Outcome: Progressing Goal: Fluid and electrolyte balance are achieved/maintained Description: Assess and monitor vital signs (orthostatic vitals if applicable), fluid intake and output, urine color, labs, skin turgor, mucous membranes, jugular venous distention, edema, circumference of edematous extremities and abdominal girth, respiratory status, and mental status.  Monitor for signs and symptoms of hypovolemia (tachycardia, rapid breathing, decreased urine output, postural hypotension, confusion, syncope).  Monitor for signs and symptoms of hypervolemia (strong rapid pulse, shortness of breath, difficulty breathing lying down, crackles heard in lung fields, edema). Collaborate with interdisciplinary team and initiate plan and interventions as ordered. Outcome: Progressing Goal: Nutritional status is improving Description: Monitor and assess patient for malnutrition (ex- brittle hair, bruises, dry skin, pale skin and conjunctiva, muscle wasting, smooth red tongue, and disorientation). Collaborate with interdisciplinary team and initiate plan and interventions as ordered.  Monitor patient's weight and dietary intake as ordered or per policy. Utilize nutrition screening tool and intervene per policy. Determine patient's food preferences and  provide high-protein, high-caloric foods as appropriate.  Outcome: Progressing   Problem: Urinary Incontinence Goal: Perineal skin integrity is maintained or improved Description: Assess genitourinary system, perineal skin, labs (urinalysis), and history of incontinence to include past management, aggravating, and alleviating factors.  Collaborate with interdisciplinary team and initiate plans and interventions as needed. Outcome: Progressing

## 2023-12-11 NOTE — Assessment & Plan Note (Signed)
 Autonomic dysregulation Hydrate, encourage p.o. intake.  Placed back on IV fluids Random cortisol level reviewed, will check a.m.  Hold olmesartan -HCTZ, amlodipine  Okay to resume donepezil , now status post permanent pacemaker Discontinue mirtazapine , as there can be orthostasis with this Compression stockings, thigh-high, abdominal binder if needed when out of bed Therapies to continue

## 2023-12-11 NOTE — Assessment & Plan Note (Signed)
 TSH 3.298, WNL

## 2023-12-11 NOTE — Assessment & Plan Note (Signed)
 Resolved

## 2023-12-11 NOTE — Assessment & Plan Note (Signed)
 Improving.  ACE inhibitor/thiazide remains on hold due to borderline blood pressure, hypotensive episodes

## 2023-12-11 NOTE — Progress Notes (Signed)
 HOSPITAL MEDICINE -  PROGRESS NOTE  Hospital Day: #3   Admission Date:  12/08/2023   PCP:  Angeline Laura, NP   Extended Emergency Contact Information Primary Emergency Contact: Drury,Jill Mobile Phone: [phone] Relation: [facility] Course: This is a 73 YO femal with history of hypertension, hypothyroidism, hyperlipidemia, mild cognitive decline, smoker, and previous history of EtOH who lives at home alone initially presented to [facility] on 7/25/2026patient presented to an outside hospital following multiple falls with what appears to be due to syncopal episode.  Was noted to be bradycardic and hypotensive.  EKG showed bradycardia with reported junctional escape rhythm per discharge summary, however EKG review has showed sinus bradycardia only.  She required 3 L of IV fluid for hypotension and was eventually started on midodrine at an outside hospital with improvement in her blood pressure.  Bradycardia persisted.  Case discussed with electrophysiology and patient transferred to [facility].  Being monitored on telemetry, remains bradycardic however in sinus bradycardia with no high-grade block. TTE obtained at an outside hospital reviewed, has normal LVEF of 64%.  Cardiology consulted and following.  Patient with ongoing dizziness as well as persistent bradycardia and orthostatic hypotension.  Will have pacemaker placement today with cardiology.  Will get PT/OT to work with her afterwards  Discharge Readiness:  Timeframe: 24H Dispo to: Home with home health Barriers to Discharge: Family concerns -safety upon return home .  Case management assistance s   Patient complaining of persistent dizziness, worsened with standing.  Orthostatic positive on reassessment this morning Family concerned about returning home, ADLs PT/OT notes reviewed Assessment/Plan   Assessment & Plan Sinus bradycardia Status post PPM - Discussed with cardiology, device check  completed.  Chest x-ray. Will require follow-up upon discharge Orthostatic hypotension Autonomic dysregulation Hydrate, encourage p.o. intake.  Placed back on IV fluids Random cortisol level reviewed, will check a.m.  Hold olmesartan -HCTZ, amlodipine  Okay to resume donepezil , now status post permanent pacemaker Discontinue mirtazapine , as there can be orthostasis with this Compression stockings, thigh-high, abdominal binder if needed when out of bed Therapies to continue Severe protein-calorie malnutrition (CMD) Nutrition consulting Acquired hypothyroidism TSH 3.298, WNL  Smoker Continues smoke, no interest in quitting.  Previously has been counseled Mild alcohol abuse in early remission Patient tells that she has not had alcohol in 2 years after going through a rehab program Acute kidney injury Improving.  ACE inhibitor/thiazide remains on hold due to borderline blood pressure, hypotensive episodes  Acute cystitis with hematuria Urine culture mixed growth.  Antibiotics discontinued Hypokalemia Resolved  Nutrition Status: Moderate protein-calorie malnutrition, in the context of Chronic illness, has been identified by the Registered Dietitian. See last RD note for recommendations. Nutrition Intervention: Medical food supplements   Agree with findings:  Yes Problem list updated: Yes     DVT Prophylaxis: Heparin SubQ  Active Medications: busPIRone , 5 mg, oral, TID donepeziL , 5 mg, oral, At Bedtime heparin (porcine), 5,000 Units, subcutaneous, Q8H SCH levothyroxine , 75 mcg, oral, Daily 0600 memantine , 10 mg, oral, BID midodrine, 10 mg, oral, TID AC rosuvastatin , 40 mg, oral, Daily   Infusions: sodium chloride , 75 mL/hr, Last Rate: Stopped (12/10/23 1515)   Objective   Temp:  [97.5 F (36.4 C)-98.3 F (36.8 C)] 97.6 F (36.4 C) Heart Rate:  [59-63] 59 Resp:  [17-20] 20 BP: (95-130)/(54-79) 105/69 O2 Flow Rate (L/min): 4 L/min  Physical Exam Elderly frail  Caucasian female No apparent distress, nontoxic-appearing Head is atraumatic CLEAR Dry mucous membranes Lungs are  clear S1-S2 without appreciable murmur, normal rate, regular Abdo soft, nontender benign thin Moves all extremities No significant lower extremity swelling, cyanosis or clubbing  Labs   Results from last 2 days  Lab Units 12/11/23 0352 12/10/23 0440  WHITE BLOOD CELL COUNT 10*3/uL 7.61 6.03  HEMOGLOBIN g/dL 9.7* 9.9*  HEMATOCRIT % 29* 30*  PLATELET COUNT 10*3/uL 168 190    Results from last 2 days  Lab Units 12/11/23 0352 12/10/23 0440  SODIUM mmol/L 146* 144*  POTASSIUM mmol/L 3.8 4.0  CHLORIDE mmol/L 121* 118*  CO2 mmol/L 20* 23  BUN mg/dL 13 14  CREATININE mg/dL 8.80* 8.73*  GLUCOSE mg/dL 98 84  CALCIUM  mg/dL 8.2* 8.5*          Bethany Wilma Brien Manila, MD

## 2023-12-11 NOTE — Consults (Signed)
 Nutrition Note   PATIENT NAME: [name] [name] DATE: 12/11/2023  TIME: 2:02 PM  Note Type: Assessment Referral Reason: Nutrition consult  Assessment  Summary: 73yof w/ hx of HTN, hypothyroidism, HLD, mild cognitive decline, smoker, & previous hx of EtOH. Lives home alone, presented to [facility] 12/07/2023 after multiple falls r/t syncopal episode. Case discussed w/ electrophysiology & transferred to [facility]. TTE obtained at outside hospital. Cardiology consulted. Ongoing dizziness & persistent bradycardia & orthostatic hypotension. Pacemaker placement 7/28 w/ cardiology.  Subjective: Met w/ pt this pm at bedside w/ sister-in-law from [place]  present. Pt is currently on regular diet. Weight hx reviewed; limited weight hx. Skin reviewed; WNL. Labs reviewed; Na=146. No complaints of n/v/d/c. Last BM Date: PTA. Pt has a poor appetite and is finishing an average of 37.5% x 4 documented meals, was NPO yesterday. Pt has no missing teeth and reports no difficulty with chewing/swallowing. However, sister-in-law discussed mentation of patient and physical capability of cutting up meats, agreeable to try easy to chew diet. Patient's sister-in-law, said family had brought in a personal-sized pizza that patient finished 90% of for dinner last night, was unable to eat any of provided dinner. Encouraged PO intake of foods she enjoys. Patient's sister-in-law says pt has a scipt that she defaults to when asked how she has been eating. Sister-in-law suspects patient was not eating as much as she says. RD will continue to monitor and f/u during admission.   Interventions  Nutrition Intervention: Medical food supplements Regular; easy to chew Ensure Plus HP daily w/ breakfast (350 kcal, 20g pro each)  Thrive Gelato BID (260 kcal, 9g pro each) chocolate   Nutrition Diagnosis  Moderate protein-calorie malnutrition POA  Malnutrition Etiology: Chronic illness  Problem Related To: Chronic illness AEB: Muscle Mass  Depletion Summary: Moderate Body Fat Depletion Summary: Moderate    Nutrition Focused Physical Findings  Nutrition History: hx of severe malnutrition. 7/29: Pt has no missing teeth and reports no difficulty with chewing/swallowing. However, sister-in-law discussed mentation of patient and physical capability of cutting up meats, agreeable to try easy to chew diet. Patient's sister-in-law, said family had brought in a personal-sized pizza that patient finished 90% of for dinner last night, was unable to eat any of provided dinner. Encouraged PO intake of foods she enjoys. Patient's sister-in-law says pt has a scipt that she defaults to when asked how she has been eating. Sister-in-law suspects patient was not eating as much as she says.  NFPE Performed Date: 12/11/23 [facility], RD) Is Malnutrition Present?: Yes    Energy Intake:   Unable to validate  Weight Loss:  Unable to validate  Muscle Mass Depletion: Temporalis: Moderate  Pectoralis:  (age-related depletion) Deltoids: Mild  Interosseous: Mild  Quadriceps: Moderate Gastrocnemius: Severe   Muscle Mass Depletion Summary: Moderate  Body Fat Depletion: Orbital Region: Severe  Buccal Region: Moderate  Triceps Region: Moderate    Body Fat Depletion Summary: Moderate           12/11/2023 Malnutrition assessment/NFPE performed. At this time sufficient evidence to diagnose malnutrition. Will continue to monitor.  Pertinent Information  PAST MEDICAL HX: Medical History[1]  ANTHROPOMETRICS: Anthropometrics Height: 152.4 cm (5') Weight: 41 kg (90 lb 6.2 oz) Weight Change %: 0 BMI (Calculated): 17.7  LABS:  Results from last 7 days  Lab Units 12/11/23 0352 12/10/23 0440 12/09/23 0157 12/08/23 2053 12/07/23 0439 12/06/23 1906  SODIUM mmol/L 146* 144* 141  --    < > 138  POTASSIUM mmol/L 3.8  4.0 4.4  --    < > 4.0  CHLORIDE mmol/L 121* 118* 114*  --    < > 104  CO2 mmol/L 20* 23 24  --    < > 22  BUN mg/dL 13 14 23   --     < > 62*  CREATININE mg/dL 8.80* 8.73* 8.68*  --    < > 2.77*  GLUCOSE mg/dL 98 84 88  --    < > 87  CALCIUM  mg/dL 8.2* 8.5* 8.8  --    < > 8.5*  MAGNESIUM  mg/dL  --   --  1.7 1.8  --  2.0  CALCIUM  CORRECTED mg/dL  --   --   --   --   --  9.1   < > = values in this interval not displayed.  No results for input(s): POCGLU in the last 48 hours. No results found for: HGBA1C    MEDS: .  busPIRone , 5 mg, oral, TID .  heparin (porcine), 5,000 Units, subcutaneous, Q8H SCH .  levothyroxine , 75 mcg, oral, Daily 0600 .  memantine , 10 mg, oral, BID .  midodrine, 10 mg, oral, TID AC .  mirtazapine , 30 mg, oral, At Bedtime .  rosuvastatin , 40 mg, oral, Daily sodium chloride , 75 mL/hr, Last Rate: Stopped (12/10/23 1515)  GI:  Gastrointestinal Gastrointestinal (WDL): Within Defined Limits Stool Assessment Bowel Incontinence: No Stool Amount: Medium Stool (mL): 0 mL  SKIN: Patient Lines/Drains/Airways Status     Active Wound / Pressure ulcer / Negative Pressure Wound     None          EDEMA:    WEIGHT HX: Weights (last 14 days)     Date/Time Weight   12/11/23 0559 41 kg (90 lb 6.2 oz)   12/10/23 0911 41 kg (90 lb 6.2 oz)   12/10/23 0800 41 kg (90 lb 6.2 oz)   12/08/23 1543 41.6 kg (91 lb 11.4 oz)      Wt Readings from Last 30 Encounters:  12/11/23 41 kg (90 lb 6.2 oz)  12/08/23 41.6 kg (91 lb 11.4 oz)   Weight Interpretation: Limited weight hx Admit weight= 41.6 kg (91 lb 11.4 oz)  PO INTAKE:  PO Intake from 12/04/23 0908 to 12/11/23 0908    Date/Time Percent Meals Eaten (%) Diet Supplements Supplement Intake (mL) Percent Supplement Consumed (%) Who  12/10/23 1700 -- -- 0 mL -- CN  12/10/23 1137 -- -- 0 mL -- CN  12/10/23 0800 -- -- 0 mL -- CN  12/09/23 1700 -- -- 0 mL -- LMK  12/09/23 1200 -- -- 120 mL -- LMK  12/09/23 0800 -- -- 0 mL -- LMK  12/08/23 1700 -- -- 0 mL -- LMK     Current Diet and Nutrition Support Details  Diet Mediterranean; easy to  chew Thrive BID chocolate & Ensure clear daily   Estimated Needs  Nutrition Needs Based On: IBW   Calories (kcal/day): 1415-1615  Calories based on:  (28-32 kcal/kg/d)  Protein (g/day): 65-80  Protein based on: 1.2-1.5g/kg, IBW  Fluid (mL/day):   1-1.1 mL/kcal, Per MD   Monitoring & Evaluation  Nutrition Goals: Adequate PO, Promote weight gain Nutrition Goal Status: Initial   Follow-up Information  Follow-Up Date: 12/13/23 Follow-Up Reason: Tolerance of PO    [address] [place], [place] 12/11/2023 2:02 PM       [1] Past Medical History: Diagnosis Date  . Smoker

## 2023-12-11 NOTE — Assessment & Plan Note (Signed)
 Continues smoke, no interest in quitting.  Previously has been counseled

## 2023-12-11 NOTE — Assessment & Plan Note (Signed)
 Patient tells that she has not had alcohol in 2 years after going through a rehab program

## 2023-12-11 NOTE — Unmapped External Note (Signed)
 Acute Occupational Therapy Evaluation   ASSESSMENT SUMMARY   Medical Diagnosis/Course: OT Medical Dx: Sinus bradycardia s/p dual chamber PPM, malnutrition, AKI  Pertinent Medical Course: Vertigo, (+) orthostatic hypotension      Occupational therapy orders received. [name] [name] is a 73 y.o. presenting on 12/08/2023. The patient's chart was reviewed, and the patient was assessed. Patient presenting today Below baseline  and with Activity Tolerance Deficits, Balance Deficits, Basic Activity of Daily living deficits, Mobility deficits, Muscle weakness, Pain limiting function, Range of motion deficits, Safety Awareness deficits. Based on clinical findings: Patient will benefit from skilled occupational therapy services to address stated impairments, with goal of improving activity limitations and overall function.     OT Recommendation: OT Recommendation: Home with intermittent supervision, Home with intermittent assistance OT Equipment Recommended: None     Home Living Environment  Type of Home Camper/RV  Home Layout One level  Exterior Stairs - number Approx 1-2  Exterior Stairs - rails    Interior Stairs - number    Interior Stairs - Administrator, sports / Tub Tub/Shower Unit  [employer] Grab bars in shower, Grab bars around toilet  Bathroom Accessibility Pt endorses access to [employer] Currently Using None  Additional Comments Pt reports prior (I) with ADLs and functional mobility without use of AE; however, endorses multiple recent falls d/t dizziness.     Prior Level of Function Level of Independence Independent  Lives With Alone  Person(s) able to assist at d/c    Patient Responsibilities Home management, Manage Finances, Manage Medications, Meal Preparation, Personal ADLs, Shopping, Social Participation  Requires Assist With        Fall in the last year?: Yes Fall/Injury Details: Pt reports recent falls d/t dizziness Feel unsteady or off  balance?: Yes  PERTINENT MEDICAL INFORMATION  OT Treatment Diagnosis:    Attention and Concentration Deficit (NOT ADHD), Joint Stiffness , and Unsteadiness on feet   Precautions:  Other Therapy Precautions: Fall risk, Pacemaker   Past Medical History:   Medical History[1]  Past Surgical History:   Surgical History[2]  SUBJECTIVE  Communication: Nursing, PT, Clinical Case Management Communication Details: RN aware of OT evaluation.  OT updating RN/PT on pt performance/vitals/position upright in recliner at close of session.  OT informing CCM of D/C recs.       General Family/Caregiver Details: None present Affect/Behavior: Impacted therapy session Affect/Behavior: Confused      OBJECTIVE  Pain:  Pain Assessment Pain Assessment: 0-10 Pain Score  : 10 Pain Type: Acute pain Pain Location: Chest Pain Orientation: Left, Upper, Anterior Pain Radiating Towards: PPM site Pain Descriptors: Sharp, Shooting Pain Frequency: Intermittent Pain Intervention(s): Repositioned, Rest, Elevated, Cold applied, Ambulation/Increased activity Response to Interventions: Pain increasing with mobility, decreasing with rest, elevation and ice   Cognition:   Orientation Level: Not oriented Not Oriented to: Time Affect/Behavior: Impacted therapy session Affect/Behavior: Confused Overall Cognitive Status: Impaired Arousal/Alertness: Appropriate responses to stimuli Attention: Attends with cues to redirect Memory: Decreased recall of recent events Following Commands: Follows all commands, Requires repetition Safety Awareness: Impaired Insight: Decreased awareness of deficits  ROM:   ROM Assessment Overall ROM Assessment: Exceptions to [facility] LUE: Limited at shoulder d/t pain and pacemaker precautions  Strength:   Strength Assessment Overall Strength Assessment: Exceptions to [facility] - Comments LUE Strength: Impaired    Balance:   Sitting - Static: Distant supervision Sitting - Dynamic:  Close supervision Standing - Static: Close supervision Standing -  Dynamic: Contact Guard Assistance  Mobility:   Bed Mobility Supine to Sit: Close supervision Transfers Assistive Device: None Sit to Stand: Close supervision Stand to Sit: Close supervision Bed to/from Chair - Transfer Type: Stand Step Bed to/from Chair - Level of Assistance: Close supervision Toilet Transfers - To: [facility] Toilet Transfers - Type: Stand Step Toilet Transfers - Level of Assistance: Close supervision Mobility Gait Distance (ft): 10 ft Gait Assistance: Scientist, physiological Device: None OT Mobility Additional Comments: Pt endorsing dizziness upon position changes, increased time for rest breaks and vitals check.  ADLs:  LB Dressing: Contact Guard Assistance LB Dressing Level: Sitting, Up in chair ADL Additional Comments: OT discussing/demonstrating UB dressing with use of compensatory strategies, pt declining to trial at this time d/t pain. Toilet Transfers - To: [facility] Toilet Transfers - Type: Stand Step Assistive Device: None Toilet Transfers - Level of Assistance: Close supervision   Orthostatic BP:    12/11/23 0844 12/11/23 0849 12/11/23 0852 12/11/23  0920  Vitals   Heart Rate 61 62 62 60  BP 122/77 117/79 101/70 120/77  MAP (mmHg) 92 92 81 89  BP Location Right arm Right arm Right arm Right arm  BP Method Automatic Automatic Automatic Automatic  Patient Position Lying Sitting Standing Sitting    Condition After Therapy:  Condition After Therapy: Up in chair, Nursing notified of condition, All needs within reach, Alarm activated, Lines intact, Fall interventions in place  INTERVENTIONS   Skilled OT Treatment Provided  OT educating pt on role of OT, the importance of OOB mobility for promoting functional outcomes, pacemaker precautions (with handout provided), fall prevention, use of call bell and Call, Don't Fall, L UE positioning strategies for comfort, compensatory upper  body dressing techniques, and D/C recs.  OT providing verbal/tactile cues for redirection to task, pacing during mobility/position changes, gentle bil LE AROM in prep for OOB mobility, placement of UEs during STS, maintaining pacemaker precautions during mobility/ADLs.  OT answering pt questions within scope of OT practice.  OT monitoring pts condition/vitals throughout evaluation to determine safe and appropriate progression of activity.      Skill Performed by Clinician: Education on compensatory strategies , Education on precautions for safe performance of functional tasks , Education to improve safety with functional tasks , Monitoring exercise/activity tolerance , Monitoring safe progression of exercise/activity , Tactile cues for proper technique , Tactile cues for safe execution of functional tasks , Verbal cues for pacing of functional tasks , Verbal cues for proper technique , and Verbal cues for safe execution of functional tasks    Response to Interventions:  Improved Activity tolerance , Improved ADL performance , Improved Balance , Improved Functional Mobility , Increased fatigue , Pain increased , and Required therapeutic rest breaks   PERFORMANCE OUTCOME MEASURES     AM-PAC - Daily Activity:    Flowsheet Row Most Recent Value  AM-PAC 6-Clicks - Daily Activity  Lower Body Clothing Activity A little  Bathing A little  Toileting A little  Upper Body Clothing Activity A little  Personal Grooming None  Eating Meals None  AM-PAC Total Score  20  OT PLAN OF CARE  Rehab Potential:  Rehab Potential Rehab Potential: Good Impairments/Limitations: Activity Tolerance Deficits, Balance Deficits, Basic Activity of Daily living deficits, Mobility deficits, Muscle weakness, Pain limiting function, Range of motion deficits, Safety Awareness deficits  Plan: Plan Patient and Family Goals: To D/C back to [facility]. Treatment Plan/Goals Established with Patient/Caregiver: Yes Planned  Treatment/Interventions: Basic activities  of daily living, Equipment training, Functional Mobility training, Pain management, Patient/Caregiver education, Therapeutic activities, Therapeutic exercises OT Frequency: 5x week OT Duration: For 2 weeks  OT Goals:  Encounter Problems     Encounter Problems (Active)     Occupational Therapy     Patient will perform bathing Independently     Start:  12/11/23    Expected End:  12/25/23         Patient will perform upper body dressing Independently     Start:  12/11/23    Expected End:  12/25/23         Patient will perform lower body dressing Independently     Start:  12/11/23    Expected End:  12/25/23         Patient will perform toileting Independently     Start:  12/11/23    Expected End:  12/25/23             Patient will perform toilet transfer Independently to Toilet     Start:  12/11/23    Expected End:  12/25/23             Patient will perform tub/shower transfer Independently     Start:  12/11/23    Expected End:  12/25/23         Patient/caregiver will demonstrate pacemaker precautions independently     Start:  12/11/23    Expected End:  12/25/23             Patient will improve functional cognition to perform ADL routine with 0 verbal cues to redirect to task or maintain pt safety.       Start:  12/11/23    Expected End:  12/25/23         Patient/caregiver will be able to verbalize 3 fall prevention strategies to decrease risk of falls during functional mobility     Start:  12/11/23    Expected End:  12/11/23             TREATMENT TIME  Time In: 0840   Time Out: 0930  Total Timed Code Minutes: 40  Total Treatment Time Minutes: 50    OT Eval Low Complexity minutes: 10   Treatment/Procedures Time Entry Self Care/Home Management Training minutes: 40         [1] Past Medical History: Diagnosis Date  . Smoker   [2] Past Surgical History: Procedure Laterality Date  . CARDIAC  ELECTROPHYSIOLOGY PROCEDURE N/A 12/10/2023   CV IMPLANT PACEMAKER DUAL CHAMBER performed by Newman Phlegm, MD at [facility]

## 2023-12-11 NOTE — Assessment & Plan Note (Signed)
 Status post PPM - Discussed with cardiology, device check completed.  Chest x-ray. Will require follow-up upon discharge

## 2023-12-11 NOTE — Unmapped External Note (Signed)
 ------------------------------------------------------------------------------- Attestation signed by Gisele Holt, PT at 12/11/2023  1:50 PM A licensed clinician was present, guiding, and directing the services delivered in this session at all times. -------------------------------------------------------------------------------  Acute Physical Therapy Evaluation  ASSESSMENT SUMMARY   Medical Diagnosis/Course: PT Medical Dx: syncopal episode,Sinus bradycardia, s/p pacemaker placement Pertinent Medical Course: Vertigo, (+) orthostatic hypotension  Physical therapy orders received. [name] [name] is a 73 y.o. admitted on 12/08/2023. The patient's chart was reviewed, and the patient was assessed. Patient presenting today below baseline and with Dizziness, Activity Tolerance Deficits, Ambulation Deficits, Balance Deficits, Decreased knowledge of condition. Based on clinical findings: patient will benefit from skilled physical therapy services to address stated impairments, with goal of improving activity limitations and overall function.   PT Recommendation: PT Recommendation: Home with intermittent assistance, Home PT, Home with intermittent supervision PT Equipment Recommended: None   Home Living Environment  Type of Home Camper/RV  Home Layout One level  Exterior Stairs - number Approx 1-2 (family reports pt has a hill to go down and up to get in and out of the camper)  Event organiser Stairs - Psychologist, sport and exercise - number    Soil scientist - Administrator, sports / Tub Tub/Shower Unit  [employer] Grab bars in shower, Grab bars around toilet  Bathroom Accessibility Pt endorses access to [employer] Currently Using None  Additional Comments Pt reports prior (I) with ADLs and functional mobility without use of AE; however, endorses multiple recent falls d/t dizziness.     Prior Level of Function Level of Independence Independent  Lives With Alone   Person(s) able to assist at d/c    Patient Responsibilities Home management, Manage Finances, Manage Medications, Meal Preparation, Personal ADLs, Shopping, Social Participation  Requires Assist With      Subjective Fall History Fall in the last year?: Yes Fall/Injury Details: Pt reports recent falls d/t dizziness Feel unsteady or off balance?: Yes   PERTINENT MEDICAL INFORMATION  PT Treatment Diagnosis:    Difficulty Walking (not elsewhere specified) and Unsteadiness on feet  Precautions: Other Therapy Precautions: Fall risk, Pacemaker  Past Medical History: Medical History[1]  Past Surgical History: Surgical History[2]  SUBJECTIVE  Communication: Patient, Family/Caregiver, Nursing, Clinical Case Management Communication Details: RN cleared pt for session. CCM updated with d/c recs. RN updated on pt +orthostatic vitals post session    General Family/Caregiver Present: Family      OBJECTIVE  Pain:   Pain Assessment Pain Assessment: No/denies pain (Pt denies pain at the start of session, but reports an increase in pain with increased activity.)   Cognition:  Orientation Level: Oriented x4 Safety Awareness: Intact Comments: Pt pleasant and cooperative, but required extra time to answer questions  Range of Motion:  RUE: WFL LUE: Limited d/t pacemaker precautions RLE: WFL LLE: WFL  Strength:   LUE Strength: limited due to pacemaker precautions    Balance:  Sitting - Static: Distant supervision Sitting - Dynamic: Close supervision Standing - Static: Contact Guard Assistance Standing - Dynamic: Contact Guard Assistance Balance Additional Comments: No AD  Functional Mobility:  Bed Mobility Bed Mobility Additional Comments: Pt in recliner pre/post session. Transfers Assistive Device: None Sit to Stand: Close supervision, Verbal Cues, Extra time, Tactile cues Stand to Sit: Close supervision, Verbal Cues Toilet Transfers - Type: Stand Step Toilet Transfers -  Level of Assistance: Contact Guard Assistance Transfers  Additional Comments: Pt required cueing for anterior weight shift and hand  placement for sit to stand and tactile cueing required at the quads to facilitate full knee extension. For stand to sit pt cued for eccentric lowering to not fall into the chair. For toliet transfer pt required cueing for sequencing and brief intermittent assist to pull down brief. Mobility Gait Distance (ft): 35 ft Gait Additional Comments: with an additional bout of 6ft and 20 ft. pt required intermittent rest breaks d/t complaints of dizziness. pt cued for reciprocal gait, step height and cued to relax RUE to reduce the chance of flexion contracture at the elbow. Pt had brief periods of LOB and external grabbing of items for steadying. Gait Assistance: Geophysicist/field seismologist, Verbal Cues Assistive Device: None Gait Deviation: Shuffle, Decreased Gait Speed, Decreased stride length -Left, Decreased stride length- Right PT Mobility Additional Comments: stairs deferred d/t fatigue and complaints of dizziness    Therapy Activity Vitals - At Rest At Rest Position: Sitting At Rest Heart Rate: 59 At Rest BP: 101/58 BP Location: Right arm BP Method: Automatic At Rest SpO2: 100 % At Rest O2 Device: None (Room air)   Therapy Activity Vitals - With Activity With Ambulation/Activity Position: Standing With Ambulation/Activity Heart Rate: 59 With Ambulation/Activity BP: 94/60 (MAP 67) BP Location: Right arm BP Method: Automatic With Ambulation/Activity SpO2 on Room Air: 95 % With Ambulation/Activity O2 Device: None (Room air)   Therapy Activity Vitals - Post Activity Post Ambulation/Activity Position: Sitting Post Ambulation/Activity BP: 111/67 (MAP 76) BP Location: Right arm BP Method: Automatic     Condition After Therapy: Up in chair, Nursing notified of condition, All needs within reach, Alarm activated, Fall interventions in place, Family present in  room, Lines intact  INTERVENTIONS   Skilled PT Treatment Provided  Skilled monitoring of vitals, pt education on +orthostatic hypotension, pt education on the importance of therapeutic exercises to manage symptoms related to +orthostatic vitals. Pt education on pacemaker precautions, transfers with proper technique to maintain pacemaker precautions and ambulation with cueing for safe execution of function task, and pt education on sign and symptom management.         Exercise Extremity Position Repetitions / Time Resistance Comments  Cardiopulmonary / Conditioning LUE RUE LLE RLE Sitting unsupported 10x AROM Pt educated on the importance of therapeutic exercise to manage symptoms of +orthostatic hypotension. Pt completed BLE and RUE exercises.     Skill Performed by Clinician:  Education on energy conservation for safe performance of functional tasks Education on home program to improve safety and performance of functional tasks Education on precautions for safe performance of functional tasks Education on safe/proper technique Facilitation for proper positioning/movement in preparation for functional tasks Monitoring exercise/activity tolerance Monitoring safe progression of exercise/activity Tactile cues for proper technique Verbal cues for proper technique Verbal cues for safe execution of functional tasks  Response to Interventions:  Improved Safety Awareness Increased fatigue Pain increased Required therapeutic rest breaks  PERFORMANCE OUTCOME MEASURES     AM-PAC - Basic Mobility:   Flowsheet Row Most Recent Value  AM-PAC 6-Clicks - Basic Mobility  Turning from you back to your side while in a flat bed without using bed rails? A little  Moving from lying on your back to sitting on the side of a flat bed without using bed rails? A little  Moving to and from a bed to a chair (including a wheelchair)? A little  Standing up from a chair using your arms (e.g, wheelchair, or  bedside chair)? A little  To walk in a hospital room?  A little  Climbing 3-5 steps with a railing? A little  AM-PAC Total Score 18   PT PLAN OF CARE  Rehab Potential: Rehab Potential: Fair Impairments/Limitations: Dizziness, Activity Tolerance Deficits, Ambulation Deficits, Balance Deficits, Decreased knowledge of condition  Plan:  Patient and Family Goals: Pt states she wants to return home and continue to be independent Planned Treatment/Interventions: Patient/Caregiver education, Therapeutic activity, Therapeutic exercise, Gait/mobility training PT Frequency: Daily PT Duration: For 2 weeks  PT Goals:  Encounter Problems     Encounter Problems (Active)     Physical Therapy     Patient will ambulate 150 feet Independently using No assistive device (Progressing)     Start:  12/11/23    Expected End:  12/25/23         Patient will go up/down 2 stairs Independently using no rails (Progressing)     Start:  12/11/23    Expected End:  12/25/23             Patient/Caregiver will demonstrate pacemaker precautions independently (Progressing)     Start:  12/11/23    Expected End:  12/25/23             Patient will go supine to/from sit with distant supervision while maintaining pacemaker precautions      Start:  12/11/23    Expected End:  12/25/23             Patient will perform sit-to-stand Independently using No assistive device while maintaining pacemaker precautions      Start:  12/11/23    Expected End:  12/25/23                 TREATMENT TIME  Time In: 1029    Time Out: 1113 Total Timed Code Minutes:  33 Total Treatment Time Minutes: 41  PT Eval Low Complexity minutes: 8   Treatment/Procedures Time Entry PT Therapeutic Exercise minutes: 8 Therapeutic Activity minutes: 25         [1] Past Medical History: Diagnosis Date  . Smoker   [2] Past Surgical History: Procedure Laterality Date  . CARDIAC ELECTROPHYSIOLOGY PROCEDURE N/A 12/10/2023    CV IMPLANT PACEMAKER DUAL CHAMBER performed by Newman Phlegm, MD at [facility]

## 2023-12-12 ENCOUNTER — Other Ambulatory Visit: Payer: Self-pay

## 2023-12-12 NOTE — Patient Outreach (Signed)
 Complex Care Management   Visit Note  12/12/2023  Name:  [name] [name] MRN: [id number] DOB: June 04, 1950  Situation: Referral received for Complex Care Management related to Failure to Thrive at home. I obtained verbal consent from Caregiver.  Visit completed with Jill Daughtry, sister of patient  on the phone. [name] reports patient is currently admitted to [facility], admitted for dizziness, had PACEMAKER placement.  She is concerned MD will be discharging her home alone when no one is going to be there to help her.    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 Continues to be inpatient as of today.  Respiratory Respiratory Symptoms Reported: Not assesed    Endocrine Endocrine Symptoms Reported: Not assessed    Gastrointestinal Gastrointestinal Symptoms Reported: Not assessed      Genitourinary Genitourinary Symptoms Reported: Not assessed    Integumentary Integumentary Symptoms Reported: Not assessed    Musculoskeletal Musculoskelatal Symptoms Reviewed: Not assessed        Psychosocial Psychosocial Symptoms Reported: Not assessed            10/11/2023    2:29 PM  Depression screen PHQ 2/9  Decreased  Interest 0  Down, Depressed, Hopeless 0  PHQ - 2 Score 0  Altered sleeping 0  Tired, decreased energy 0  Change in appetite 0  Feeling bad or failure about yourself  0  Trouble concentrating 0  Moving slowly or fidgety/restless 0  Suicidal thoughts 0  PHQ-9 Score 0  Difficult doing work/chores Not difficult at all    There were no vitals filed for this visit.  Medications Reviewed Today   Medications were not reviewed in this encounter     Recommendation:   -[name] (sister) will call social worker at [facility] hospital to further discuss discharge plans, will work with SW to explain condition of the home, and that there are no formal caregivers available, recommending SNF placement.    Follow Up Plan:   Telephone follow-up two weeks.  Santana Stamp BSN, CCM [place]  VBCI Population Health RN Care Manager Direct Dial: [phone]  Fax: [phone]

## 2023-12-12 NOTE — Patient Instructions (Signed)
 Visit Information  Thank you for taking time to visit with me today. Please don't hesitate to contact me if I can be of assistance to you before our next scheduled appointment.  Your next care management appointment is : not scheduled as of yet, Ms. Giza is currently inpatient at [facility].    Please call the care guide team at [phone] if you need to cancel, schedule, or reschedule an appointment.   A reminder to ALL patients/family/friends, please call the [place]  National Suicide Prevention Lifeline: (251)550-9055 or TTY: (252) 813-3046 TTY 838-342-3966) to talk to a trained counselor if you are experiencing a Mental Health or Behavioral Health Crisis or need someone to talk to.  Santana Stamp BSN, CCM [place]  VBCI Population Health RN Care Manager Direct Dial: [phone]  Fax: [phone]

## 2023-12-12 NOTE — Progress Notes (Addendum)
 Case Management Interim Assessment Note Patient Information: DOB:04-Feb-1951 Gender:female Admission Date: 12/08/2023 Primary Care Provider: Angeline Laura, NP Preferred Pharmacy: [id number] - [place], [place] - [address] AT [facility] - PHONE: [phone] - FAX: [phone]   Unplanned Readmission Score:  13.68  Discharge Barriers Discharge Barriers Identified  : No Barriers Identified  Narrative Anticipated Discharge Plan: LOS day #4. CM reviewed chart and discussed with care team during rounds. Patient is hospitalized for Sinus bradycardia. CM met with patient at bedside and discussed discharge planning. Patient's treatment includes status post PPM, device check completed, chest x-ray, nutrition counseling, and cardiac and medical management. Patient;s daughter concerned about discharge. CM will remain available for all discharge planning needs as they arise.   NEXT STEPS: CM will continue to monitor recommendations and assist with a safe discharge.      Anticipated Discharge Location: Home with Home Health  If Plan A discharging location is not feasible: Potential Plan B: Home   Patient Choice Information: Patient Choice provided: Provided and reviewed by patient/family and selection has been made.   Discharge Plan discussed with:  Name: Patient Relationship to Patient: Self Phone Number:[phone]  Any concerns with discharge plan: No concerns verbalized with current anticipated discharge plan Date: Wed 12/12/2023  Patient's level of function on discharge is expected to return to prior level of function.   Discharge Disposition: Home or Self Care  Assessment Completed by: Tonja JINNY Carmel, RN

## 2023-12-13 NOTE — Progress Notes (Signed)
 HOSPITAL MEDICINE -  PROGRESS NOTE  Hospital Day: #5   Admission Date:  12/08/2023   PCP:  Angeline Laura, NP   Extended Emergency Contact Information Primary Emergency Contact: Drury,Jill Mobile Phone: [phone] Relation: [facility] Course: This is a 73 YO femal with history of hypertension, hypothyroidism, hyperlipidemia, mild cognitive decline, smoker, and previous history of EtOH who lives at home alone initially presented to [facility] on 7/25/2026patient presented to an outside hospital following multiple falls with what appears to be due to syncopal episode.  Was noted to be bradycardic and hypotensive.  EKG showed bradycardia with reported junctional escape rhythm per discharge summary, however EKG review has showed sinus bradycardia only.  She required 3 L of IV fluid for hypotension and was eventually started on midodrine at an outside hospital with improvement in her blood pressure.  Bradycardia persisted.  Case discussed with electrophysiology and patient transferred to [facility].  Being monitored on telemetry, remains bradycardic however in sinus bradycardia with no high-grade block. TTE obtained at an outside hospital reviewed, has normal LVEF of 64%.  Cardiology consulted and following.  Patient with ongoing dizziness as well as persistent bradycardia and orthostatic hypotension.  Will have pacemaker placement today with cardiology.  Will get PT/OT to work with her afterwards  Discharge Readiness:  Timeframe: Dec 13, 2023 Dispo to: Other: TBD, pending reassessment by therapy Barriers to Discharge: Orthostasis  Subjective   Worked with therapy, unable to participate with completely due to her orthostasis.  Discussed wearing compression stockings as well as abdominal binder.  I have adjusted her medication in hopes of improving her orthostasis.  Also her ACTH stimulation test is normal, ruling out AI  Assessment/Plan   Assessment & Plan Sinus  bradycardia Status post PPM Outpatient follow-up cardiology Orthostatic hypotension Autonomic dysregulation Hydrate, encourage p.o. intake.   Adrenal insufficiency ruled out Hold olmesartan -HCTZ, amlodipine  Okay to resume donepezil , now status post permanent pacemaker Discontinue mirtazapine , as there can be orthostasis with this Compression stockings, thigh-high, abdominal binder   Severe protein-calorie malnutrition (CMD) Nutrition consulting Acquired hypothyroidism TSH 3.298, WNL  Smoker Continues smoke, no interest in quitting.  Previously has been counseled Mild alcohol abuse in early remission Patient tells that she has not had alcohol in 2 years after going through a rehab program Acute kidney injury Improving.  ACE inhibitor/thiazide remains on hold due to borderline blood pressure, hypotensive episodes  Acute cystitis with hematuria Urine culture mixed growth.  Antibiotics discontinued Hypokalemia Resolved  Nutrition Status: Moderate protein-calorie malnutrition, in the context of Chronic illness, has been identified by the Registered Dietitian. See last RD note for recommendations. Nutrition Intervention: Medical food supplements, Continue current nutrition regimen   Agree with findings:  Yes Problem list updated: Yes     DVT Prophylaxis: Lovenox  SubQ  Active Medications: busPIRone , 5 mg, oral, TID donepeziL , 5 mg, oral, At Bedtime enoxaparin , 40 mg, subcutaneous, At Bedtime levothyroxine , 75 mcg, oral, Daily 0600 memantine , 10 mg, oral, BID midodrine, 10 mg, oral, TID AC rosuvastatin , 40 mg, oral, Daily   Infusions: sodium chloride , 75 mL/hr, Last Rate: 75 mL/hr (12/13/23 0528)   Objective   Temp:  [97.2 F (36.2 C)-98.1 F (36.7 C)] 97.5 F (36.4 C) Heart Rate:  [59-62] 62 Resp:  [14-18] 14 BP: (67-118)/(44-68) 67/44 O2 Flow Rate (L/min): 4 L/min  Physical Exam Elderly frail female Has atraumatic CLEAR Treatment line Lungs are  clear S1-S2 without murmur, normal rate, regular I was soft, nontender benign Moves  all extremities, no significant lower EXTR swelling, cyanosis  Labs   Results from last 2 days  Lab Units 12/13/23 1224 12/11/23 1836  WHITE BLOOD CELL COUNT 10*3/uL 8.99 6.70  HEMOGLOBIN g/dL 9.5* 89.1  HEMATOCRIT % 29* 32*  PLATELET COUNT 10*3/uL 128* 163    Results from last 2 days  Lab Units 12/13/23 1225 12/12/23 1102  SODIUM mmol/L 139 141  POTASSIUM mmol/L 3.3* 3.8  CHLORIDE mmol/L 113* 114*  CO2 mmol/L 23 24  BUN mg/dL 6* 9  CREATININE mg/dL 9.02* 8.97*  GLUCOSE mg/dL 895 76  CALCIUM  mg/dL 7.8* 8.0*          Bethany Wilma Brien Manila, MD

## 2023-12-13 NOTE — Progress Notes (Signed)
 Acute Occupational Therapy Treatment  ASSESSMENT SUMMARY   Medical Diagnosis/Course: OT Medical Dx: Sinus bradycardia s/p dual chamber PPM, malnutrition, AKI  Pertinent Medical Course: Vertigo, (+) orthostatic hypotension   [name] [name] is 73 y.o. presenting on 12/08/2023. Today's occupational therapy treatment focused on active engagement in self care tasks and tolerance to upright position. The patient has demonstrated forward progress as evidenced by improved engagement, slight improvement in dizziness though not enough for total safety and returning to baseline.  Based on clinical findings: At this time the patient still presents with deficits limiting function. Further skilled therapy services are reasonable and necessary to address continued impairment and performance deficits..   OT Recommendation:  OT Recommendation: Home with intermittent supervision, Home with intermittent assistance   OT Equipment Recommended: None         Home Living Environment  Type of Home Camper/RV  Home Layout One level  Exterior Stairs - number Approx 1-2  Exterior Stairs - rails    Interior Stairs - number    Interior Stairs - Administrator, sports / Tub Tub/Shower Unit  [employer] Grab bars in shower, Grab bars around toilet  Bathroom Accessibility Pt endorses access to [employer] Currently Using None  Additional Comments Pt reports prior (I) with ADLs and functional mobility without use of AE; however, endorses multiple recent falls d/t dizziness.     Prior Level of Function Level of Independence Independent  Lives With Alone  Person(s) able to assist at d/c    Patient Responsibilities Home management, Manage Finances, Manage Medications, Meal Preparation, Personal ADLs, Shopping, Social Participation  Requires Assist With          PERTINENT MEDICAL INFORMATION  Precautions: Other Therapy Precautions: Fall risk, Pacemaker Comment: Compression Stockings  and Abdominal Binder already in place   SUBJECTIVE  Communication: Nursing, MD, PT, Patient, Clinical Case Management Communication Details: Engaged with team on pt's current functional level and status. Also provided update on vitals througout session     General Family/Caregiver Details: none present   OBJECTIVE  Pain:   Pain Assessment Pain Assessment: No/denies pain   Cognition Overall Cognitive Status: Impaired Memory: Decreased recall of recent events, Decreased recall of precautions Following Commands: Requires repetition Insight: Decreased awareness of deficits Problem Solving: Assistance required to generate solutions, Assistance required to implement solutions    Balance Sitting - Static: Independent Sitting - Dynamic: Distant supervision Standing - Static: Close supervision Standing - Dynamic: Close supervision Balance Additional Comments: no AD in standing    Vital Signs:    12/13/23 1411 12/13/23 1414 12/13/23 1418  Vitals  BP 128/67 121/67 116/67  MAP (mmHg) 87 85 83  BP Location Right arm Right arm Right arm  BP Method Automatic Automatic Automatic  Patient Position Lying Sitting Standing   Later in session s/p functional mobility and ADLs   12/13/23 1436 12/13/23 1438  Vitals  BP (!) 99/56 (!) 96/56  MAP (mmHg) 70 69  BP Location Right arm Right arm  BP Method Automatic Automatic  Patient Position Sitting Standing             INTERVENTIONS  Mobility:  Bed Mobility Supine to Sit: Close supervision, Verbal Cues Sit to Supine: Independent Bed Mobility Additional Comments: cues for pacing Transfers Sit to Stand: Close supervision, Verbal Cues Stand to Sit: Close supervision Bed to/from Chair - Transfer Type: Stand Step Bed to/from Chair - Level of Assistance: Close supervision Transfers  Additional Comments: Therapist requesting teach back of safety strategies with mobility, pt able to report 'go slow' though still needing cues  intermittently for pacing. BP 128/67 (supine) --> 121/67 (sitting) and 116/67 (standing). Pt reporting dizziness with each repositioning though improved from previously and able to state correct repsonse to dizziness. Able to complete transfers though with slight imbalance intermittently Mobility Gait Distance (ft): 20 ft Gait Assistance: Close supervision, Extra time Assistive Device: None OT Mobility Additional Comments: pt to ambulate with cues for pacing especially during turns. Reports slight increase in dizziness with turns, no overt LOB  ADLs: Grooming: Distant supervision Grooming Level: Standing sinkside Bathing: Close supervision, Verbal Cues, Extra time Bathing Level: Sitting sinkside, Standing sinkside UB Dressing: Close supervision, Verbal Cues UB Dressing Level: Sitting, Standing LB Dressing: Close supervision, Verbal Cues, Extra time LB Dressing Level: Sitting, Up in chair, Standing ADL Additional Comments: Pt completed ADL tasks sitting/standing as needed, though needing directional cues for safe positioning (standing vs sitting) during routine. Utilized figure 4 position and slow Social worker by Clinician:    Education on proper use of assistive device/adaptive equipment , Education on compensatory strategies , Education on precautions for safe performance of functional tasks , Education to improve safety with functional tasks , Facilitation for proper positioning/movement in preparation for functional tasks , Facilitation for safe movement and performance of functional tasks , Monitoring exercise/activity tolerance , Monitoring safe progression of exercise/activity , Verbal cues for pacing of functional tasks , Verbal cues for proper technique , and Verbal cues for safe execution of functional tasks   Response to Intervention:  Improved Activity tolerance , Improved ADL performance , Improved Functional Mobility , and Required therapeutic rest breaks     Condition After Therapy:  Condition After Therapy: Back to bed, Nursing notified of condition, All needs within reach, Alarm activated, Lines intact, Fall interventions in place  PERFORMANCE OUTCOME MEASURES     AM-PAC - Daily Activity:    Flowsheet Row Most Recent Value  AM-PAC 6-Clicks - Daily Activity  Lower Body Clothing Activity A little  Bathing A little  Toileting A little  Upper Body Clothing Activity A little  Personal Grooming A little  Eating Meals None  AM-PAC Total Score 19   OT PLAN OF CARE  Rehab Potential: Rehab Potential Rehab Potential: Good Impairments/Limitations: Activity Tolerance Deficits, Balance Deficits, Basic Activity of Daily living deficits, Mobility deficits, Muscle weakness, Pain limiting function, Range of motion deficits, Safety Awareness deficits  Plan:   Plan Patient and Family Goals: To D/C back to [facility]. Treatment Plan/Goals Established with Patient/Caregiver: Yes Planned Treatment/Interventions: Basic activities of daily living, Equipment training, Functional Mobility training, Pain management, Patient/Caregiver education, Therapeutic activities, Therapeutic exercises OT Frequency: 5x week OT Duration: For 2 weeks  OT Goals:   Encounter Problems     Encounter Problems (Active)     Occupational Therapy     Patient will perform bathing Independently (Progressing)     Start:  12/11/23    Expected End:  12/25/23         Patient will perform upper body dressing Independently (Progressing)     Start:  12/11/23    Expected End:  12/25/23         Patient will perform lower body dressing Independently (Progressing)     Start:  12/11/23    Expected End:  12/25/23         Patient will perform toileting Independently (Progressing)  Start:  12/11/23    Expected End:  12/25/23             Patient will perform toilet transfer Independently to Toilet (Progressing)     Start:  12/11/23    Expected End:  12/25/23              Patient will perform tub/shower transfer Independently     Start:  12/11/23    Expected End:  12/25/23         Patient/caregiver will demonstrate pacemaker precautions independently (Progressing)     Start:  12/11/23    Expected End:  12/25/23             Patient will improve functional cognition to perform ADL routine with 0 verbal cues to redirect to task or maintain pt safety.   (Progressing)     Start:  12/11/23    Expected End:  12/25/23         Patient/caregiver will be able to verbalize 3 fall prevention strategies to decrease risk of falls during functional mobility (Progressing)     Start:  12/11/23    Expected End:  12/25/23             TREATMENT TIME  Time In: 1409  Time Out: 1459 Total Timed Code Minutes: 49  Total Treatment Time Minutes: 49       Treatment/Procedures Time Entry Self Care/Home Management Training minutes: 49

## 2023-12-14 NOTE — Care Plan (Signed)
 Problem: Risk for Fall-High-IP Goal: Patient will remain free of fall during hospital stay-IP Outcome: Progressing Intervention: Universal Fall Precautions Interventions-IP Recent Flowsheet Documentation Taken 12/13/2023 2200 by Levorn Leeds, RN HD IP Universal Fall Precautions Interventions: . Call light/belongings in reach . Wheelchairs and chairs locked . Bed in low position and locked . Siderails up X2 . Clutter free and spill free environment . Ensure adequate lighting . Educate to the purpose of universal fall precautions . Educate to call for assistance . Keep closet and bathroom doors closed when not in use . Use of footwear (See row information) Intervention: High Fall Precautions Interventions-IP Recent Flowsheet Documentation Taken 12/13/2023 2200 by Levorn Leeds, RN HD IP High Fall Risk Interventions (15 or greater): SABRA Follow universal fall precautions . Individualize HD Falls Care Plan . Place fall risk patient ID band on patient . Provide patient/family education based on risk assessment using the HDS . Instruct patient/family to call staff for assistance when getting out of bed or accessing out of reach items . Place red fall precaution signage outside patient door . Place fall mats on one or both sides of the bed or in front of chair when OOB. For rehab, at minimum, use mat(s) overnight and when fatigued (in bed). . Place patient in a room closer to the nursing station if available . Place on bed/chair alarm/patient should have alarm when OOB in chair or during transport. (See row information) Intervention: Mobility Interventions-IP Recent Flowsheet Documentation Taken 12/13/2023 2200 by Levorn Leeds, RN HD IP Mobility Interventions: SABRA Minimize the use of immobilizing devices such as urinary catheters and restraints . Schedule physical activity with assistance throughout the day and inform patient/ family of activity schedule . Reduce environmental triggers causing  dizziness: Keep room cool, avoid helium filled balloons, and provide comfort measures during transport, dangle prior to standing, set up environment to avoid movements that result in dizziness . Ensure that ambulating patients have mobile IV pole if applicable . If patient is MODERATE or HIGH RISK: Utilize a bed/ chair fall alarm . If patient is MODERATE or HIGH RISK: Use fall mats on one or both sides of the bed or in front of chair when OOB. For rehab, at minimum, use mat(s) overnight and when fatigued (in bed). . Remove fall mat from floor when patient getting out of bed or chair and replace upon return . If using a low bed, ensure proper positioning (patient's knee at a 90 degree angle with feet flat on floor) of low  beds when rising from and returning to bed . Provide assistive device to patients who use at home (walker, cane, etc.). Educate the remaining patients on the correct use of assistive devices and the importance of utilizing the device to ambulate when applicable . Instruct patient/family to exit bed on their strongest side . Use proper positioning assist devices (chair belts, wedges, recliners) . Use gait belt when assisting patient to ambulate when not contraindicated . Initiate PT Consult (Requires physician order) . Patient on bedrest should use a bedpan Intervention: Medication Interventions-IP Recent Flowsheet Documentation Taken 12/13/2023 2200 by Levorn Leeds, RN HD IP Medication Interventions: . Review medications daily with patient and family and educate on side effects . Collaborate with physician and pharmacist to assess for medications that can be discontinued or dosage decreased . Limit combination of PRN medications when possible (ex., sedative, analgesics, etc.) . Provide patient who received diuretics/laxatives frequent assistive opportunities to use bathroom . Toilet patient before giving pain medication .  Avoid showering or bathing after giving pain  medications . Adjust medication schedule to time meds with side effects to be given during appropriate period of time (i.e., Lasix to be given in am) . Collaborate with physician and pharmacist to assess need for orthostatic hypotension [facility]) evaluation Intervention: Mental Status/LOC/Awareness Interventions-IP Recent Flowsheet Documentation Taken 12/13/2023 2200 by Levorn Leeds, RN HD IP Mental Status/LOC/Awareness Interventions: . Reorient patient as needed . Reinforce falls education with each patient contact . Encourage family presence if it has a positive effect on patient's mentation and/or mood and notify staff when leaving . Collaborate with care team to determine possible reversible causes of altered mental status and eliminate cause . Check on the patient hourly . Utilize a bed/chair fall alarm if patient is moderate or high risk . Do not leave patient unattended while toileting or bathing. For rehab, follow facility toileting protocol . Instruct/reinforce with patient and family the use of call light for assistance . Patients that are lethargic or unable to follow directions- use bedpan. For rehab, may consider BSC and follow facility toileting protocol. . Patients that are awake and alert who will follow directions and call for assistance- BSC or toilet . Provide activity when appropriate (crossword, coloring, puzzles etc.) . Relocate patient to a room closer to the nurses station if possible . Place a fall mat on one or both sides of the bed when patient is unattended if MODERATE OR HIGH RISK. For rehab, at minimum use mat(s) overnight and when fatigued (in bed). Intervention: Toileting Interventions-IP Recent Flowsheet Documentation Taken 12/13/2023 2200 by Levorn Leeds, RN HD IP Toileting Interventions: . Toilet patient at regular and frequent intervals . Obtain elevated toilet seat or bedside commode (BSC) if needed and place the [facility] parallel to bed with bed in front of BSC when  in use . Provide frequent toileting opportunity to patient who are receiving laxatives and diuretics . Ensure adequate bowel/bladder function by providing sufficient fluid and fiber as allowed by diet . Instruct female patient prone to dizziness to void while sitting (use urinal when necessary) . Instruct patient/family on the use of grab bars and to call for assistance when toileting . Assess need for continued urinary catheter use . Toilet prior to giving pain medications . Ensure catheter bag is placed below bladder level to promote drainage Intervention: Volume/Electrolyte Interventions-IP Recent Flowsheet Documentation Taken 12/13/2023 2200 by Levorn Leeds, RN HD IP Volume/Electrolyte Interventions: . Ensure that the patient remains hydrated . If diabetic, monitor blood sugar and facilitate interventions to maintain appropriate blood sugar . Ensure that IV tubing length does not create risk for falls . Advance diet as tolerated when applicable to minimize use of intravenous and enteral feeds . Administer medications as ordered for n/v and provide interventions to prevent/minimize n/v . Monitor abnormal lab values . Collaborate with care team to ensure IV fluids are appropriate to patient's current condition . Teach patient to dangle before rising if hypotensive . Patient with low blood sugar or hypotension should be toileted using a bedpan.   For rehab, should use BSC or bathroom and follow facility toileting protocol.

## 2023-12-21 ENCOUNTER — Telehealth: Payer: Self-pay

## 2023-12-21 NOTE — Telephone Encounter (Signed)
 Copied from CRM [id number]. Topic: Appointments - Scheduling Inquiry for Clinic >> Dec 21, 2023  1:43 PM Charlet HERO wrote: Reason for CRM: [name] is calling to get asst with schdling a phone appt with regina about hospitalization and with what is going on right now, and also one with Ms Santana case Charity fundraiser. She would like a call back after 8/11 [phone] [facility]

## 2023-12-21 NOTE — Telephone Encounter (Signed)
 Yes, needs to be 40 minute though

## 2023-12-21 NOTE — Telephone Encounter (Signed)
 LM for [name] to give us  a call back to get that virtual visit scheduled. Needs to be a 40 min appt.

## 2023-12-21 NOTE — Telephone Encounter (Signed)
 Copied from CRM [id number]. Topic: Appointments - Scheduling Inquiry for Clinic >> Dec 21, 2023  2:35 PM Everette C wrote: Reason for CRM: The patient's sister has returned a missed phone call from the practice regarding scheduling a 40 minute virtual visit.  Agent was unable to successfully schedule at the time of call with patient's sister  Please contact again when possible

## 2023-12-22 ENCOUNTER — Other Ambulatory Visit: Payer: Self-pay | Admitting: Internal Medicine

## 2023-12-25 ENCOUNTER — Ambulatory Visit: Admitting: Internal Medicine

## 2023-12-25 NOTE — Progress Notes (Deleted)
 Subjective:    Patient ID: [name] [name], female    DOB: 1950/09/07, 73 y.o.   MRN: [id number]  HPI    Virtual Visit via Video Note  I connected with [name] [name] on 12/25/23 at  3:40 PM EDT by a video enabled telemedicine application and verified that I am speaking with the correct person using two identifiers.  Location: Patient: Home Provider: Office  Person's participating in this video call: Angeline Laura, NP-C and Philis Kutsch, Jill Drury POA   I discussed the limitations of evaluation and management by telemedicine and the availability of in person appointments. The patient expressed understanding and agreed to proceed.  History of Present Illness:  Patient due for follow-up of chronic conditions.  HTN: Her BP today is 130/72. She is taking amlodipine , olmesartan  HCT as prescribed. ECG from 10/2017 reviewed.  MCI: She reports persistent memory issues. Managed on memantine . MRI brain from 10/2020 reviewed. She no longer follows with neurology.  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 She takes tramadol  as needed. She does not follow with orthopedics.  Anxiety and depression: Persistent. She is taking mirtazapine  and buspirone  and do feel like they are working well for her. She is not currently seeing a therapist. She deneis SI/HI.  Chronic diarrhea:  Improved.  She is not currently taking any medications for this.  She follows with GI.  HLD: Her last LDL was 140, triglycericeds 104, 07/2022. She denies myalgias on rosuvastatin .  She has been trying to consume low-fat diet.  Hypothyroidism: She is unsure if she is actually taking levothyroxine .  She does not follow with endocrinology.  Prediabetes: Her last A1c was 5.5%, 07/2022.  She is not taking any oral diabetic medications time.  She does not check her sugars.  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 Swallow whole. 30 tablet 12   busPIRone  (BUSPAR ) 5 MG tablet TAKE 1 TABLET THREE TIMES DAILY 270 tablet 0   donepezil  (ARICEPT ) 5 MG tablet Take 1 tablet (5 mg total) by mouth at bedtime. 90 tablet 1   levothyroxine  (SYNTHROID ) 75 MCG tablet Take 1 tablet (75 mcg total) by mouth daily before breakfast. 90 tablet 0   meclizine  (ANTIVERT ) 25 MG tablet Take 1 tablet (25 mg total) by mouth 3 (three) times daily as needed for dizziness. 30 tablet 0   memantine  (NAMENDA ) 10 MG tablet TAKE 1 TABLET TWICE DAILY 180 tablet 1   mirtazapine  (REMERON  SOL-TAB) 30 MG disintegrating tablet DISSOLVE 1 TABLET ON THE TONGUE AT BEDTIME 90 tablet 1   olmesartan -hydrochlorothiazide  (BENICAR  HCT) 40-25 MG tablet TAKE 1 TABLET EVERY DAY 90 tablet 1   rosuvastatin  (CRESTOR ) 40 MG tablet Take 1 tablet (40 mg total) by mouth daily. 90 tablet 0   No current facility-administered medications for this visit.    Allergies  Allergen Reactions   Erythromycin Other (See Comments)    REACTION: GI upset   Penicillins Nausea And Vomiting    Family History  Problem Relation Age of Onset   Diabetes Mother    Stroke Mother    Other Mother        colon sugery   Polymyalgia rheumatica Mother    Memory loss Mother        late 3s   Alcoholism Mother    Memory loss Father    Heart disease Father    Diabetes Father    Dementia Father        unspecified type; late 70s/early 57s   Colon cancer Neg Hx     Cancer Neg Hx    Esophageal cancer Neg Hx    Pancreatic cancer Neg Hx    Stomach cancer Neg Hx     Social History   Socioeconomic History   Marital status: Married    Spouse name: Not on file   Number of children: 2   Years of education: 16   Highest education level: Bachelor's degree (e.g., BA, AB, BS)  Occupational History   Occupation: Retired    Comment: Engineer, site  Tobacco Use   Smoking status: Every Day    Current packs/day: 1.00    Types: Cigarettes   Smokeless tobacco: Never  Vaping Use   Vaping status: Former  Substance and Sexual Activity   Alcohol use: Not Currently    Comment: occ   Drug use: No   Sexual activity: Not Currently    Birth control/protection: Post-menopausal  Other Topics Concern   Not on file  Social History Narrative   Not on file   Social Drivers of Health   Financial Resource Strain: Not on file  Food Insecurity: Low Risk  (12/08/2023)   Received from [facility]   Hunger Vital Sign    Within the past 12 months, you worried that your food would run out before you got money to buy more: Never true    Within the past 12 months, the food you bought just didn't last and you didn't have money to get more. : Never true  Transportation Needs: No Transportation Needs (12/08/2023)   Received from [facility]    In the past 12 months, has lack of reliable transportation kept you from medical appointments, meetings, work or from getting things needed for daily living? : No  Physical Activity: Not on file  Stress: Not on file  Social Connections: Not on file  Intimate Partner Violence: Not At Risk (10/26/2023)   Humiliation, Afraid, Rape, and Kick questionnaire    Fear of Current or Ex-Partner: No    Emotionally Abused: No    Physically Abused: No    Sexually Abused: No     Constitutional: Patient reports fatigue.  Denies fever, malaise, headache or abrupt weight changes.  HEENT: Patient reports right ear pain.   Denies eye pain, eye redness, ringing in the ears, wax buildup, runny nose, nasal congestion, bloody nose, or sore throat. Respiratory: Denies difficulty breathing, shortness of breath, cough or sputum production.   Cardiovascular: Denies chest pain, chest tightness, palpitations or swelling in the hands or feet.  Gastrointestinal: Patient reports intermittent diarrhea.  Denies abdominal pain, bloating, constipation, or blood in the stool.  GU: Denies urgency, frequency, pain with urination, burning sensation, blood in urine, odor or discharge. Musculoskeletal: Patient reports intermittent pain in her tailbone.  Denies decrease in range of motion, difficulty  with gait, muscle pain or joint swelling.  Skin: Denies redness, rashes, lesions or ulcercations.  Neurological: Patient reports difficulty with memory, insomnia.  Denies dizziness, difficulty with speech or problems with balance and coordination.  Psych: Patient has a history of anxiety and depression.  Denies SI/HI.  No other specific complaints in a complete review of systems (except as listed in HPI above).  Objective:   Physical Exam There were no vitals taken for this visit.  Wt Readings from Last 3 Encounters:  10/11/23 93 lb 6 oz (42.4 kg)  07/12/23 93 lb 12.8 oz (42.5 kg)  11/22/22 98 lb (44.5 kg)    General: Appears her stated age, underweight, in NAD. Skin: Warm, dry and intact.  HEENT: Head: normal shape and size; Eyes: sclera white, no icterus, conjunctiva pink, PERRLA and EOMs intact; right ear: TM gray and intact, + serous effusion noted Neck:  Neck supple, trachea midline. No masses, lumps or thyromegaly present.  Cardiovascular: Normal rate and rhythm. S1,S2 noted.  No murmur, rubs or gallops noted. No JVD or BLE edema. No carotid bruits noted. Pulmonary/Chest: Normal effort and positive vesicular breath sounds. No respiratory distress. No wheezes, rales or ronchi noted.  Abdomen: Soft and nontender. Normal bowel  sounds.  Musculoskeletal: No difficulty with gait.  Neurological: Alert and oriented.  She has trouble with recall.  Coordination normal.  Psychiatric: Mood and affect normal.  Behavior is normal.  Judgment and thought content normal.    BMET    Component Value Date/Time   NA 133 (L) 10/11/2023 1411   K 4.2 10/11/2023 1411   CL 96 (L) 10/11/2023 1411   CO2 27 10/11/2023 1411   GLUCOSE 98 10/11/2023 1411   BUN 16 10/11/2023 1411   CREATININE 1.11 (H) 10/11/2023 1411   CALCIUM  10.5 (H) 10/11/2023 1411   GFRNONAA >60 01/01/2020 0405   GFRAA >60 01/01/2020 0405    Lipid Panel     Component Value Date/Time   CHOL 188 10/11/2023 1411   TRIG 133 10/11/2023 1411   HDL 38 (L) 10/11/2023 1411   CHOLHDL 4.9 10/11/2023 1411   VLDL 33.8 05/13/2019 1228   LDLCALC 125 (H) 10/11/2023 1411    CBC    Component Value Date/Time   WBC 12.6 (H) 10/11/2023 1411   RBC 3.71 (L) 10/11/2023 1411   HGB 11.6 (L) 10/11/2023 1411   HCT 35.1 10/11/2023 1411   PLT 390 10/11/2023 1411   MCV 94.6 10/11/2023 1411   MCH 31.3 10/11/2023 1411   MCHC 33.0 10/11/2023 1411   RDW 12.2 10/11/2023 1411   LYMPHSABS 1.5 12/31/2019 0904   MONOABS 1.0 12/31/2019 0904   EOSABS 0.1 12/31/2019 0904   BASOSABS 0.0 12/31/2019 0904    Hgb A1C Lab Results  Component Value Date   HGBA1C 5.8 (H) 10/11/2023            Assessment & Plan:     RTC in 3 months for your annual exam  Follow Up Instructions:    I discussed the assessment and treatment plan with the patient. The patient was provided an opportunity to ask questions and all were answered. The patient agreed with the plan and demonstrated an understanding of the instructions.   The patient was advised to call back or seek an in-person evaluation if the symptoms worsen or if the condition fails to improve as anticipated. Angeline Laura, NP

## 2023-12-25 NOTE — Telephone Encounter (Signed)
 Requested Prescriptions  Pending Prescriptions Disp Refills   busPIRone  (BUSPAR ) 5 MG tablet [Pharmacy Med Name: BUSPIRONE  HYDROCHLORIDE 5 MG Oral Tablet] 270 tablet 1    Sig: TAKE 1 TABLET THREE TIMES DAILY     Psychiatry: Anxiolytics/Hypnotics - Non-controlled Passed - 12/25/2023  3:44 PM      Passed - Valid encounter within last 12 months    Recent Outpatient Visits           2 months ago Encounter for general adult medical examination with abnormal findings   [place] [facility] [place], Angeline ORN, NP   5 months ago Pure hypercholesterolemia   [place] [facility] [place], Angeline ORN, [place]

## 2023-12-27 ENCOUNTER — Telehealth: Payer: Self-pay

## 2023-12-27 NOTE — Telephone Encounter (Signed)
 Copied from CRM [id number]. Topic: Clinical - Home Health Verbal Orders >> Dec 26, 2023  4:43 PM Tobias CROME wrote: Caller/Agency: [facility] Callback Number: [phone], confidential VM - leave detailed message with name and credentials. Service Requested: Speech Therapy Evaluation Frequency: Scheduled for next week, speech therapist will reach out to office.  Any new concerns about the patient? Requesting speech therapy evaluation for patient to assess cognition and work with patient.

## 2023-12-27 NOTE — Telephone Encounter (Signed)
 Spoke with Kat verbal orders given.

## 2023-12-27 NOTE — Telephone Encounter (Signed)
 Ok for verbal orders as requested.

## 2023-12-28 ENCOUNTER — Telehealth: Payer: Self-pay

## 2023-12-28 NOTE — Telephone Encounter (Signed)
 Copied from CRM [id number]. Topic: Appointments - Scheduling Inquiry for Clinic >> Dec 28, 2023  9:36 AM Harlene ORN wrote: Reason for CRM: Dannon Perlow - [facility] Just calling to make sure the patient has been scheduled for a hospital follow up. Patient will have to call back to schedule. Phone: [phone]

## 2024-01-03 ENCOUNTER — Other Ambulatory Visit: Payer: Self-pay | Admitting: Internal Medicine

## 2024-01-04 ENCOUNTER — Telehealth: Payer: Self-pay

## 2024-01-04 NOTE — Telephone Encounter (Signed)
 Requested Prescriptions  Pending Prescriptions Disp Refills   olmesartan -hydrochlorothiazide  (BENICAR  HCT) 40-25 MG tablet [Pharmacy Med Name: OLMESARTAN  MEDOXOMIL/HYDROCHLOROTHIAZIDE  40-25 MG Oral Tablet] 90 tablet 1    Sig: TAKE 1 TABLET EVERY DAY     Cardiovascular: ARB + Diuretic Combos Failed - 01/04/2024 12:31 PM      Failed - Na in normal range and within 180 days    Sodium  Date Value Ref Range Status  10/11/2023 133 (L) 135 - 146 mmol/L Final         Failed - Cr in normal range and within 180 days    Creat  Date Value Ref Range Status  10/11/2023 1.11 (H) 0.60 - 1.00 mg/dL Final         Passed - K in normal range and within 180 days    Potassium  Date Value Ref Range Status  10/11/2023 4.2 3.5 - 5.3 mmol/L Final         Passed - eGFR is 10 or above and within 180 days    GFR calc Af Amer  Date Value Ref Range Status  01/01/2020 >60 >60 mL/min Final   GFR calc non Af Amer  Date Value Ref Range Status  01/01/2020 >60 >60 mL/min Final   GFR  Date Value Ref Range Status  07/29/2020 86.13 >60.00 mL/min Final    Comment:    Calculated using the CKD-EPI Creatinine Equation (2021)   eGFR  Date Value Ref Range Status  10/11/2023 53 (L) > OR = 60 mL/min/1.57m2 Final         Passed - Patient is not pregnant      Passed - Last BP in normal range    BP Readings from Last 1 Encounters:  10/11/23 90/60         Passed - Valid encounter within last 6 months    Recent Outpatient Visits           2 months ago Encounter for general adult medical examination with abnormal findings   [place] [facility] [place], Angeline ORN, NP   5 months ago Pure hypercholesterolemia   [place] [facility] [place], [place], NP               rosuvastatin  (CRESTOR ) 40 MG tablet [Pharmacy Med Name: ROSUVASTATIN  CALCIUM  40 MG Oral Tablet] 90 tablet 1    Sig: TAKE 1 TABLET EVERY DAY     Cardiovascular:  Antilipid - Statins 2 Failed - 01/04/2024 12:31  PM      Failed - Cr in normal range and within 360 days    Creat  Date Value Ref Range Status  10/11/2023 1.11 (H) 0.60 - 1.00 mg/dL Final         Failed - Lipid Panel in normal range within the last 12 months    Cholesterol  Date Value Ref Range Status  10/11/2023 188 <200 mg/dL Final   LDL Cholesterol (Calc)  Date Value Ref Range Status  10/11/2023 125 (H) mg/dL (calc) Final    Comment:    Reference range: <100 . Desirable range <100 mg/dL for primary prevention;   <70 mg/dL for patients with CHD or diabetic patients  with > or = 2 CHD risk factors. SABRA LDL-C is now calculated using the Martin-Hopkins  calculation, which is a validated novel method providing  better accuracy than the Friedewald equation in the  estimation of LDL-C.  Gladis APPLETHWAITE et al. SANDREA. 7986;689(80): 2061-2068  (http://education.[employer].com/faq/FAQ164)  HDL  Date Value Ref Range Status  10/11/2023 38 (L) > OR = 50 mg/dL Final   Triglycerides  Date Value Ref Range Status  10/11/2023 133 <150 mg/dL Final         Passed - Patient is not pregnant      Passed - Valid encounter within last 12 months    Recent Outpatient Visits           2 months ago Encounter for general adult medical examination with abnormal findings   [facility] [place], Angeline ORN, NP   5 months ago Pure hypercholesterolemia   [place] [facility] [place], [place], NP               amLODipine  (NORVASC ) 10 MG tablet [Pharmacy Med Name: AMLODIPINE  BESYLATE 10 MG Oral Tablet] 90 tablet 1    Sig: TAKE 1 TABLET EVERY DAY     Cardiovascular: Calcium  Channel Blockers 2 Passed - 01/04/2024 12:31 PM      Passed - Last BP in normal range    BP Readings from Last 1 Encounters:  10/11/23 90/60         Passed - Last Heart Rate in normal range    Pulse Readings from Last 1 Encounters:  11/22/22 69         Passed - Valid encounter within last 6 months    Recent Outpatient  Visits           2 months ago Encounter for general adult medical examination with abnormal findings   [place] [facility] [place], Angeline ORN, NP   5 months ago Pure hypercholesterolemia   [place] [facility] [place], Angeline ORN, NP               memantine  (NAMENDA ) 10 MG tablet [Pharmacy Med Name: MEMANTINE  HYDROCHLORIDE 10 MG Oral Tablet] 180 tablet 1    Sig: TAKE 1 TABLET TWICE DAILY     Neurology:  Alzheimer's Agents 2 Failed - 01/04/2024 12:31 PM      Failed - Cr in normal range and within 360 days    Creat  Date Value Ref Range Status  10/11/2023 1.11 (H) 0.60 - 1.00 mg/dL Final         Passed - eGFR is 5 or above and within 360 days    GFR calc Af Amer  Date Value Ref Range Status  01/01/2020 >60 >60 mL/min Final   GFR calc non Af Amer  Date Value Ref Range Status  01/01/2020 >60 >60 mL/min Final   GFR  Date Value Ref Range Status  07/29/2020 86.13 >60.00 mL/min Final    Comment:    Calculated using the CKD-EPI Creatinine Equation (2021)   eGFR  Date Value Ref Range Status  10/11/2023 53 (L) > OR = 60 mL/min/1.59m2 Final         Passed - Valid encounter within last 6 months    Recent Outpatient Visits           2 months ago Encounter for general adult medical examination with abnormal findings   [place] [facility] [place], Angeline ORN, NP   5 months ago Pure hypercholesterolemia   [place] [facility] [place], Angeline ORN, [place]

## 2024-01-04 NOTE — Patient Instructions (Signed)
 Visit Information  Thank you for taking time to visit with me today. Please don't hesitate to contact me if I can be of assistance to you before our next scheduled appointment.  Your next care management appointment is by telephone on Wednesday, September 3rd  at 2:00pm.    Please call the care guide team at [phone] if you need to cancel, schedule, or reschedule an appointment.   A reminder to ALL patients/family/friends, please call the [place]  National Suicide Prevention Lifeline: 636-785-4852 or TTY: 8572224415 TTY [phone]) to talk to a trained counselor if you are experiencing a Mental Health or Behavioral Health Crisis or need someone to talk to.  Santana Stamp BSN, CCM [place]  VBCI Population Health RN Care Manager Direct Dial: [phone]  Fax: [phone]
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
## 2024-01-16 ENCOUNTER — Other Ambulatory Visit: Payer: Self-pay

## 2024-01-16 NOTE — Patient Outreach (Signed)
 Complex Care Management   Visit Note  01/16/2024  Name:  [name] [name] MRN: [id number] DOB: Sep 06, 1950  Situation: Referral received for Complex Care Management related to Not Thriving at Home. I obtained verbal consent from Caregiver.  Visit completed with Caregiver/sister, [name],  on the phone.  Attempted to conference call with patient and sister, patient did not answer.  [name] states patient now has an aide that has been going into patient's home seven days a week for an hour a day to give med reminder, help with light housekeeping, make sure patient has food in the home.  Aide is taking patient's BP and weights daily for now.    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  [name] has been in contact with caregiver earlier today. Taking memantine  10mg  twice a day, donepezil  5mg  at bedtime.)      Neurological Neurological Review of Symptoms: Not assessed    HEENT HEENT Symptoms Reported: Not assessed      Cardiovascular Cardiovascular Symptoms Reported: Not assessed Cardiovascular Comment: Taking rosuvastatin  40mg  once daily.  Amlodipine  and olmesartan -HCTZ was DISCONTINUED at hospitalization.  Caregiver is taking BP and weights each day, sister/Jill will call back in with latest  BP and weight results.  Cardiology f/u  scheduled for 02/07/24.  Respiratory Respiratory Symptoms Reported: Not assesed    Endocrine Endocrine Symptoms Reported: Not assessed Endocrine Comment: Taking levothyroxine  75mcg once in the AM  Gastrointestinal Gastrointestinal Symptoms Reported: Not assessed      Genitourinary Genitourinary Symptoms Reported: Not assessed    Integumentary Integumentary Symptoms Reported: Not assessed    Musculoskeletal Musculoskelatal Symptoms Reviewed: Not assessed        Psychosocial Psychosocial Symptoms Reported: Not assessed Additional Psychological Details: Taking buspirone  5mg  three times a day.          01/16/2024    PHQ2-9 Depression Screening   Little interest or pleasure in doing things    Feeling down, depressed, or hopeless    PHQ-2 - Total Score    Trouble falling or staying asleep, or sleeping too much    Feeling tired or having little energy    Poor appetite or overeating     Feeling bad about yourself - or that you are a failure or have let yourself or your family down    Trouble concentrating on things, such as reading the newspaper or watching television    Moving or speaking so slowly that other people could have noticed.  Or the opposite - being so fidgety or restless that you have been moving around a lot more than usual    Thoughts that you would be better off dead, or hurting yourself in some way    PHQ2-9 Total Score    If you checked off any problems, how difficult have these problems made it for you to do your work, take care of things at  home, or get along with other people    Depression Interventions/Treatment      There were no vitals filed for this visit.  Medications Reviewed Today   Medications were not reviewed in this encounter     Recommendation:   Specialty provider follow-up Cardiology, [facility] on 02/07/24 - caregiver will be providing transportation and accompanying Ms. Cislo to this appointment.  Sister, [name], will call back in with  BP/weight results.  Educated on reporting BP's trending at 140/90 and above since midodrine was started in the hospital, other BP meds were stopped due to low BP's, and reporting any weight gain of 2-3lbs in one day or 5lbs in one week.    Follow Up Plan:   Telephone follow-up 4 weeks  Santana Stamp BSN, CCM [place]  [facility] Population Health RN Care Manager Direct Dial: [phone]  Fax: [phone]

## 2024-01-16 NOTE — Patient Instructions (Signed)
 Visit Information  Thank you for taking time to visit with me today. Please don't hesitate to contact me if I can be of assistance to you before our next scheduled appointment.  Your next care management appointment is by telephone on Monday, September 29th at 2:00pm.    Please call the care guide team at [phone] if you need to cancel, schedule, or reschedule an appointment.   A reminder to ALL patients/family/friends, please call the [place]  National Suicide Prevention Lifeline: 641 251 1855 or TTY: 984 058 6469 TTY [phone]) to talk to a trained counselor if you are experiencing a Mental Health or Behavioral Health Crisis or need someone to talk to.  Santana Stamp BSN, CCM [place]  [facility] Population Health RN Care Manager Direct Dial: [phone]  Fax: [phone]

## 2024-01-24 ENCOUNTER — Other Ambulatory Visit: Payer: Self-pay | Admitting: Internal Medicine

## 2024-01-24 MED ORDER — LEVOTHYROXINE SODIUM 75 MCG PO TABS: 75.0000 ug | ORAL_TABLET | Freq: Every day | ORAL | 0 refills | Status: DC

## 2024-01-24 MED ORDER — POTASSIUM CHLORIDE CRYS ER 20 MEQ PO TBCR: 20.0000 meq | EXTENDED_RELEASE_TABLET | Freq: Once | ORAL | 0 refills | Status: DC

## 2024-01-24 MED ORDER — FLUDROCORTISONE ACETATE 0.1 MG PO TABS: 0.1000 mg | ORAL_TABLET | Freq: Every day | ORAL | 0 refills | Status: DC

## 2024-01-24 MED ORDER — DONEPEZIL HCL 5 MG PO TABS: 5.0000 mg | ORAL_TABLET | Freq: Every day | ORAL | 0 refills | Status: DC

## 2024-01-24 NOTE — Telephone Encounter (Signed)
 Copied from CRM [id number]. Topic: Clinical - Medication Refill >> Jan 24, 2024  9:31 AM Berneda F wrote: Medication:  potassium chloride  SA (KLOR-CON  M) 20 MEQ tablet  Midodrine-10 MG Tablet HCL (This is not on the med list but was presribed by hospitalist). levothyroxine  (SYNTHROID ) 75 MCG tablet  donepezil  (ARICEPT ) 5 MG tablet  fludrocortisone  (FLORINEF ) 0.1 MG tablet    Has the patient contacted their pharmacy? Yes (Agent: If no, request that the patient contact the pharmacy for the refill. If patient does not wish to contact the pharmacy document the reason why and proceed with request.) (Agent: If yes, when and what did the pharmacy advise?)  This is the patient's preferred pharmacy:  [facility] - [place], [place] - [address] [place] [address] Phone: [phone] Fax: [phone]  Is this the correct pharmacy for this prescription? Yes If no, delete pharmacy and type the correct one.   Has the prescription been filled recently? No  Is the patient out of the medication? No  Has the patient been seen for an appointment in the last year OR does the patient have an upcoming appointment? Yes  Can we respond through MyChart? Yes  Agent: Please be advised that Rx refills may take up to 3 business days. We ask that you follow-up with your pharmacy.

## 2024-02-04 ENCOUNTER — Telehealth: Payer: Self-pay

## 2024-02-04 NOTE — Telephone Encounter (Signed)
 Copied from CRM [id number]. Topic: Clinical - Medication Question >> Feb 04, 2024 11:02 AM Treva T wrote: Reason for CRM: Received call from sister of patient, [name] [name], [place] verified, calling on behalf of patient. Requesting to speak to clinical staff regarding medications her sister is taking.  Patient sister, [name] [name] states [facility] home [facility] does not have the most updated medication list, and she would like to review all medications patient is to be taking.  Can be reached with a follow up call at [phone].  Patient is aware of same day call back.

## 2024-02-04 NOTE — Telephone Encounter (Signed)
 I am honestly not comfortable refilling her medications given the fact that she has had an hospital admission and a rehab stay.  I have no vital signs, no labs to review and these are things that need to be done before filling medications.  I am not familiar with any PCPs in the [place] area.  As far as the date goes, that would be what ever date I provided her with a letter that deemed her incompetent.  Although she had mild cognitive impairment, she was never told that she could not make any legal or financial decisions on her own.

## 2024-02-04 NOTE — Telephone Encounter (Signed)
 Spoke with Jill and she is requesting refills on all medications to be sent to [facility]. States patient canceled insurance through [place] and has [facility] now. Okay to refill medications? [name] states that patient is still supposedly taking all medications as prescribed. Did want to know if patient should be taking the Midorine that is on outside list. If so, can this be refilled as well? Update on patient - states she has been sober since April and now lives in [place], [place] near [place]. Do you know of any providers to recommend in that area? I advised [name] that you may not know of anyone given location. Also wanted to mention that patient has court coming up due to $39,000 in debt with ex husband. Wants to know if you can provide a date back as to when patient was diagnosed and advised not to make any legal or financial decisions on her own? Thanks!

## 2024-02-11 ENCOUNTER — Other Ambulatory Visit: Payer: Self-pay

## 2024-02-12 NOTE — Patient Instructions (Signed)
 Visit Information  Thank you for taking time to visit with me today. Please don't hesitate to contact me if I can be of assistance to you before our next scheduled appointment.  Your next care management appointment is by telephone on Tuesday, October 28th at 1:30pm.    Please call the care guide team at [phone] if you need to cancel, schedule, or reschedule an appointment.   A reminder to ALL patients/family/friends, please call the [place]  National Suicide Prevention Lifeline: 620-117-6741 or TTY: 3076766866 TTY (984) 129-6543) to talk to a trained counselor if you are experiencing a Mental Health or Behavioral Health Crisis or need someone to talk to.  Santana Stamp BSN, CCM [place]  [facility] Population Health RN Care Manager Direct Dial: [phone]  Fax: [phone]

## 2024-02-12 NOTE — Patient Outreach (Signed)
 Complex Care Management   Visit Note  02/12/2024  Name:  [name] [name] MRN: [id number] DOB: 03/30/1951  Situation: Referral received for Complex Care Management related to Failure to Thrive. I obtained verbal consent from Caregiver.  Visit completed with sister, [name] [name]  on the phone.  Patient had follow up visit with Cardiology at [facility] (unable to see OV notes in EPIC, per chart review, next Cardio appt is set for next year, 02/16/25).  Patient continues to have a caregiver that visits four days a week for medication management, monitoring BP and weights, current weight is 94lbs which is said to be an increase. Caregiver also makes sure food is in the home and being consumed, and provides transportation to MD visits/food shopping.  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, sacral and sacrococcygeal region 10/16/2017    Assessment: Patient Reported Symptoms:  Cognitive Cognitive Status: Struggling with memory recall, Requires Assistance Decision Making, Poor judgment in daily scenarios (Assessment performed with sister, [name] [name], unable to confernce call with patient, she didn't answer her phone)      Neurological Neurological Review of Symptoms: No symptoms reported    HEENT HEENT Symptoms Reported: Not assessed      Cardiovascular Cardiovascular Symptoms Reported: Not assessed    Respiratory Respiratory Symptoms Reported: Not assesed    Endocrine Endocrine Symptoms Reported: Not assessed    Gastrointestinal Gastrointestinal Symptoms Reported: Not assessed      Genitourinary Genitourinary Symptoms Reported: Not assessed     Integumentary Integumentary Symptoms Reported: Not assessed    Musculoskeletal Musculoskelatal Symptoms Reviewed: Not assessed        Psychosocial Psychosocial Symptoms Reported: Not assessed          02/12/2024    PHQ2-9 Depression Screening   Little interest or pleasure in doing things    Feeling down, depressed, or hopeless    PHQ-2 - Total Score    Trouble falling or staying asleep, or sleeping too much    Feeling tired or having little energy    Poor appetite or overeating     Feeling bad about yourself - or that you are a failure or have let yourself or your family down    Trouble concentrating on things, such as reading the newspaper or watching television    Moving or speaking so slowly that other people could have noticed.  Or the opposite - being so fidgety or restless that you have been moving around a lot more than usual    Thoughts that you would be better off dead, or hurting yourself in some way    PHQ2-9 Total Score    If you checked off any problems, how difficult have these problems made it for you to do your work, take care of things at home, or get along with other people    Depression Interventions/Treatment      There were no vitals filed for this visit.  Medications Reviewed Today   Medications were not reviewed in this encounter     Recommendation:   Specialty provider follow-up Cardiology 02/16/2025 It would be beneficial for [name] [name] to find an in-network Primary Care  Provider in the [place] area for medication management, urgent needs, since she plans on staying in the [place] area.    Follow Up Plan:   Telephone follow-up in 1 month  Santana Stamp BSN, CCM [place]  [facility] Population Health RN Care Manager Direct Dial: [phone]  Fax: [phone]

## 2024-03-11 ENCOUNTER — Telehealth: Payer: Self-pay

## 2024-03-13 ENCOUNTER — Other Ambulatory Visit: Payer: Self-pay

## 2024-03-13 NOTE — Patient Outreach (Addendum)
 Complex Care Management   Visit Note  03/13/2024  Name:  [name] [name] MRN: [id number] DOB: Oct 08, 1950  Situation: Referral received for Complex Care Management related to Adult Failure to Thrive. I obtained verbal consent from Caregiver.  Visit completed with Jill Drury/sister/DPR  on the phone. Patient is now living in [facility] community with caregiver visiting to assist with medications.  Sister, [name], continues to assist with managing bills/appointments/medication refills.  Patient will stop seeing MD at [facility], will have new patient appt with Clement Maduro at [facility].   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, sacral and sacrococcygeal region 10/16/2017    Assessment: Patient Reported Symptoms:  Cognitive Cognitive Status: Struggling with memory recall, Poor judgment in daily scenarios, Requires Assistance Decision Making (Assessment performed with sister/DPR, [name].)      Neurological Neurological Review of Symptoms: No symptoms reported    HEENT HEENT Symptoms Reported: Not assessed      Cardiovascular Cardiovascular Symptoms Reported: Not assessed    Respiratory Respiratory Symptoms Reported: Not assesed    Endocrine Endocrine Symptoms Reported: Not assessed    Gastrointestinal Gastrointestinal Symptoms Reported: Not assessed      Genitourinary Genitourinary Symptoms Reported: Not assessed    Integumentary Integumentary Symptoms Reported: Not assessed    Musculoskeletal Musculoskelatal Symptoms Reviewed: Not assessed        Psychosocial Psychosocial Symptoms Reported: Not assessed           03/13/2024    PHQ2-9 Depression Screening   Little interest or pleasure in doing things    Feeling down, depressed, or hopeless    PHQ-2 - Total Score    Trouble falling or staying asleep, or sleeping too much    Feeling tired or having little energy    Poor appetite or overeating     Feeling bad about yourself - or that you are a failure or have let yourself or your family down    Trouble concentrating on things, such as reading the newspaper or watching television    Moving or speaking so slowly that other people could have noticed.  Or the opposite - being so fidgety or restless that you have been moving around a lot more than usual    Thoughts that you would be better off dead, or hurting yourself in some way    PHQ2-9 Total Score    If you checked off any problems, how difficult have these problems made it for you to do your work, take care of things at home, or get along with other people    Depression Interventions/Treatment      There were no vitals filed for this visit.  Medications Reviewed Today   Medications were not reviewed in this encounter     Recommendation:   .New patient appointment 05/02/24 at [facility], Annmarie [place]/NP.   Follow Up Plan:   Closing From:  Complex Care Management, caregiver/DPR/sister, [name] agrees goals have been met.   Santana Stamp BSN, CCM [place]  [facility] Population Health RN Care Manager Direct Dial: [phone]  Fax: [phone]

## 2024-03-13 NOTE — Patient Instructions (Signed)
 Visit Information  Thank you for taking time to visit with me today. Please don't hesitate to contact me if I can be of assistance to you in the future.    Your next care management appointment is no further scheduled appointments.    Please call the [place]  National Suicide Prevention Lifeline: (361)314-9351 or TTY: 618-851-1928 TTY [phone]) to talk to a trained counselor if you are experiencing a Mental Health or Behavioral Health Crisis or need someone to talk to.  Santana Stamp BSN, CCM [place]  [facility] Population Health RN Care Manager Direct Dial: [phone]  Fax: [phone]

## 2024-03-17 LAB — CBC
HCT: 35.1 % (ref 35.0–45.0)
Hemoglobin: 11.6 g/dL — ABNORMAL LOW (ref 11.7–15.5)
MCH: 31.3 pg (ref 27.0–33.0)
MCHC: 33 g/dL (ref 32.0–36.0)
MCV: 94.6 fL (ref 80.0–100.0)
MPV: 11.8 fL (ref 7.5–12.5)
Platelets: 390 Thousand/uL (ref 140–400)
RBC: 3.71 Million/uL — ABNORMAL LOW (ref 3.80–5.10)
RDW: 12.2 % (ref 11.0–15.0)
WBC: 12.6 Thousand/uL — ABNORMAL HIGH (ref 3.8–10.8)

## 2024-03-17 LAB — HEMOGLOBIN A1C
Hgb A1c MFr Bld: 5.8 % — ABNORMAL HIGH (ref <5.7)
Mean Plasma Glucose: 120 mg/dL
eAG (mmol/L): 6.6 mmol/L

## 2024-03-17 LAB — COMPREHENSIVE METABOLIC PANEL WITH GFR
AG Ratio: 1.5 (calc) (ref 1.0–2.5)
ALT: 8 U/L (ref 6–29)
AST: 13 U/L (ref 10–35)
Albumin: 4.6 g/dL (ref 3.6–5.1)
Alkaline phosphatase (APISO): 66 U/L (ref 37–153)
BUN/Creatinine Ratio: 14 (calc) (ref 6–22)
BUN: 16 mg/dL (ref 7–25)
CO2: 27 mmol/L (ref 20–32)
Calcium: 10.5 mg/dL — ABNORMAL HIGH (ref 8.6–10.4)
Chloride: 96 mmol/L — ABNORMAL LOW (ref 98–110)
Creat: 1.11 mg/dL — ABNORMAL HIGH (ref 0.60–1.00)
Globulin: 3 g/dL (ref 1.9–3.7)
Glucose, Bld: 98 mg/dL (ref 65–139)
Potassium: 4.2 mmol/L (ref 3.5–5.3)
Sodium: 133 mmol/L — ABNORMAL LOW (ref 135–146)
Total Bilirubin: 0.4 mg/dL (ref 0.2–1.2)
Total Protein: 7.6 g/dL (ref 6.1–8.1)
eGFR: 53 mL/min/1.73m2 — ABNORMAL LOW (ref >=60)

## 2024-03-17 LAB — LIPID PANEL
Cholesterol: 188 mg/dL (ref <200)
HDL: 38 mg/dL — ABNORMAL LOW (ref >=50)
LDL Cholesterol (Calc): 125 mg/dL — ABNORMAL HIGH
Non-HDL Cholesterol (Calc): 150 mg/dL — ABNORMAL HIGH (ref <130)
Total CHOL/HDL Ratio: 4.9 (calc) (ref <5.0)
Triglycerides: 133 mg/dL (ref <150)

## 2024-03-17 LAB — TSH: TSH: 2.31 m[IU]/L (ref 0.40–4.50)

## 2024-03-17 LAB — VITAMIN B12: Vitamin B-12: 593 pg/mL (ref 200–1100)

## 2024-03-17 LAB — FOLATE: Folate: 3.9 ng/mL — ABNORMAL LOW

## 2024-03-17 LAB — VITAMIN D 25 HYDROXY (VIT D DEFICIENCY, FRACTURES): Vit D, 25-Hydroxy: 23 ng/mL — ABNORMAL LOW (ref 30–100)

## 2024-03-17 LAB — T4, FREE: Free T4: 1 ng/dL (ref 0.8–1.8)

## 2024-04-02 ENCOUNTER — Other Ambulatory Visit: Payer: Self-pay | Admitting: Internal Medicine

## 2024-04-04 ENCOUNTER — Other Ambulatory Visit: Payer: Self-pay | Admitting: Internal Medicine

## 2024-04-04 NOTE — Telephone Encounter (Signed)
 Requested Prescriptions  Pending Prescriptions Disp Refills   donepezil  (ARICEPT ) 5 MG tablet [Pharmacy Med Name: DONEPEZIL  HYDROCHLORIDE 5 MG Oral Tablet] 90 tablet 3    Sig: TAKE 1 TABLET AT BEDTIME     Neurology:  Alzheimer's Agents Passed - 04/04/2024  4:03 PM      Passed - Valid encounter within last 6 months    Recent Outpatient Visits           5 months ago Encounter for general adult medical examination with abnormal findings   [place] [facility] [place], Angeline ORN, NP   8 months ago Pure hypercholesterolemia   [place] [facility] [place], Angeline ORN, [place]

## 2024-04-05 ENCOUNTER — Other Ambulatory Visit: Payer: Self-pay | Admitting: Internal Medicine

## 2024-04-05 NOTE — Telephone Encounter (Signed)
 Refilled 04/04/24 # 90. Requested Prescriptions  Signed Prescriptions Disp Refills   levothyroxine  (SYNTHROID ) 75 MCG tablet 90 tablet 1    Sig: TAKE ONE TABLET (75 MCG TOTAL) BY MOUTH DAILY AT 8 AM BEFORE BREAKFAST     Endocrinology:  Hypothyroid Agents Passed - 04/05/2024  4:16 PM      Passed - TSH in normal range and within 360 days    TSH  Date Value Ref Range Status  10/11/2023 2.31 0.40 - 4.50 mIU/L Final         Passed - Valid encounter within last 12 months    Recent Outpatient Visits           5 months ago Encounter for general adult medical examination with abnormal findings   [place] [facility] [place], Angeline ORN, NP   8 months ago Pure hypercholesterolemia   [place] [facility] [place], [place] W, NP               fludrocortisone  (FLORINEF ) 0.1 MG tablet 90 tablet 1    Sig: TAKE ONE TABLET (0.1MG  TOTAL) BY MOUTH DAILY AT 9 AM     Not Delegated - Endocrinology: Oral Corticosteroids - fludrocortisone  Failed - 04/05/2024  4:16 PM      Failed - This refill cannot be delegated      Failed - Manual Review: Eye exam for IOP if prolonged treatment      Failed - Na in normal range and within 180 days    Sodium  Date Value Ref Range Status  10/11/2023 133 (L) 135 - 146 mmol/L Final         Failed - Bone Mineral Density or Dexa Scan completed in the last 2 years      Passed - K in normal range and within 180 days    Potassium  Date Value Ref Range Status  10/11/2023 4.2 3.5 - 5.3 mmol/L Final         Passed - Glucose (serum) in normal range and within 180 days    Glucose, Bld  Date Value Ref Range Status  10/11/2023 98 65 - 139 mg/dL Corrected    Comment:    .        Non-fasting reference interval . . This amended report is issued due to a previously reported incorrect reference range on the original report. There is no change in the flagging of the result. . => Indicates changed result(s) or information.           Passed - Last BP in normal range    BP Readings from Last 1 Encounters:  10/11/23 90/60         Passed - Valid encounter within last 6 months    Recent Outpatient Visits           5 months ago Encounter for general adult medical examination with abnormal findings   [place] [facility] [place], Angeline ORN, NP   8 months ago Pure hypercholesterolemia   [place] [facility] [place], Angeline ORN, NP              Refused Prescriptions Disp Refills   donepezil  (ARICEPT ) 5 MG tablet [Pharmacy Med Name: donepezil  5 mg tablet] 90 tablet 11    Sig: TAKE ONE TABLET (5MG  TOTAL) BY MOUTH DAILY AT 9 PM AT BEDTIME     Neurology:  Alzheimer's Agents Passed - 04/05/2024  4:16 PM      Passed -  Valid encounter within last 6 months    Recent Outpatient Visits           5 months ago Encounter for general adult medical examination with abnormal findings   [place] [facility] [place], Angeline ORN, NP   8 months ago Pure hypercholesterolemia   [place] [facility] [place], Angeline ORN, [place]

## 2024-04-05 NOTE — Telephone Encounter (Signed)
 Requested Prescriptions  Pending Prescriptions Disp Refills   donepezil  (ARICEPT ) 5 MG tablet [Pharmacy Med Name: donepezil  5 mg tablet] 90 tablet 11    Sig: TAKE ONE TABLET (5MG  TOTAL) BY MOUTH DAILY AT 9 PM AT BEDTIME     Neurology:  Alzheimer's Agents Passed - 04/05/2024  4:14 PM      Passed - Valid encounter within last 6 months    Recent Outpatient Visits           5 months ago Encounter for general adult medical examination with abnormal findings   [facility] [place], Angeline ORN, NP   8 months ago Pure hypercholesterolemia   [place] [facility] [place], [place] W, NP               levothyroxine  (SYNTHROID ) 75 MCG tablet [Pharmacy Med Name: levothyroxine  75 mcg tablet] 90 tablet 11    Sig: TAKE ONE TABLET (75 MCG TOTAL) BY MOUTH DAILY AT 8 AM BEFORE BREAKFAST     Endocrinology:  Hypothyroid Agents Passed - 04/05/2024  4:14 PM      Passed - TSH in normal range and within 360 days    TSH  Date Value Ref Range Status  10/11/2023 2.31 0.40 - 4.50 mIU/L Final         Passed - Valid encounter within last 12 months    Recent Outpatient Visits           5 months ago Encounter for general adult medical examination with abnormal findings   [facility] [place], Angeline ORN, NP   8 months ago Pure hypercholesterolemia   [facility] [place], [place] W, NP               fludrocortisone  (FLORINEF ) 0.1 MG tablet [Pharmacy Med Name: fludrocortisone  0.1 mg tablet] 90 tablet 11    Sig: TAKE ONE TABLET (0.1MG  TOTAL) BY MOUTH DAILY AT 9 AM     Not Delegated - Endocrinology: Oral Corticosteroids - fludrocortisone  Failed - 04/05/2024  4:14 PM      Failed - This refill cannot be delegated      Failed - Manual Review: Eye exam for IOP if prolonged treatment      Failed - Na in normal range and within 180 days    Sodium  Date Value Ref Range Status  10/11/2023 133 (L) 135 - 146 mmol/L  Final         Failed - Bone Mineral Density or Dexa Scan completed in the last 2 years      Passed - K in normal range and within 180 days    Potassium  Date Value Ref Range Status  10/11/2023 4.2 3.5 - 5.3 mmol/L Final         Passed - Glucose (serum) in normal range and within 180 days    Glucose, Bld  Date Value Ref Range Status  10/11/2023 98 65 - 139 mg/dL Corrected    Comment:    .        Non-fasting reference interval . . This amended report is issued due to a previously reported incorrect reference range on the original report. There is no change in the flagging of the result. . => Indicates changed result(s) or information.          Passed - Last BP in normal range    BP Readings from Last 1 Encounters:  10/11/23 90/60  Passed - Valid encounter within last 6 months    Recent Outpatient Visits           5 months ago Encounter for general adult medical examination with abnormal findings   [facility] [place], Angeline ORN, NP   8 months ago Pure hypercholesterolemia   [facility] [place], Angeline ORN, [place]

## 2024-04-07 NOTE — Telephone Encounter (Signed)
 Requested medications are due for refill today.  unsure  Requested medications are on the active medications list.  yes  Last refill. 01/24/2024 #90 0 rf  Future visit scheduled.   yes  Notes to clinic.  Rx expired 01/24/2024.    Requested Prescriptions  Pending Prescriptions Disp Refills   Potassium Chloride  ER 20 MEQ TBCR [Pharmacy Med Name: potassium chloride  ER 20 mEq tablet,extended release] 90 tablet 11    Sig: TAKE ONE TABLET (20 MEQ TOTAL) BY MOUTH DAILY AT 9 AM     Endocrinology:  Minerals - Potassium Supplementation Failed - 04/07/2024 12:31 PM      Failed - Cr in normal range and within 360 days    Creat  Date Value Ref Range Status  10/11/2023 1.11 (H) 0.60 - 1.00 mg/dL Final         Passed - K in normal range and within 360 days    Potassium  Date Value Ref Range Status  10/11/2023 4.2 3.5 - 5.3 mmol/L Final         Passed - Valid encounter within last 12 months    Recent Outpatient Visits           5 months ago Encounter for general adult medical examination with abnormal findings   [facility] [place], Angeline ORN, NP   9 months ago Pure hypercholesterolemia   [place] [facility] [place], Angeline ORN, [place]

## 2024-04-15 ENCOUNTER — Ambulatory Visit: Admitting: Internal Medicine

## 2024-04-25 ENCOUNTER — Other Ambulatory Visit: Payer: Self-pay | Admitting: Internal Medicine

## 2024-04-28 NOTE — Telephone Encounter (Signed)
 Rx 04/04/24 #90- too soon Requested Prescriptions  Pending Prescriptions Disp Refills   donepezil  (ARICEPT ) 5 MG tablet [Pharmacy Med Name: donepezil  5 mg tablet] 90 tablet 0    Sig: TAKE ONE TABLET (5MG  TOTAL) BY MOUTH DAILY AT 9 PM AT BEDTIME     Neurology:  Alzheimer's Agents Failed - 04/28/2024  2:42 PM      Failed - Valid encounter within last 6 months    Recent Outpatient Visits           6 months ago Encounter for general adult medical examination with abnormal findings   [facility] [place], Angeline ORN, NP   9 months ago Pure hypercholesterolemia    [facility] [place], Angeline ORN, [place]

## 2024-05-13 NOTE — Progress Notes (Signed)
 Case Management Final Discharge Note Patient Information: Name: [name] [name]  DOB:[birth year] Gender:female MRN: [id number]  Admission Date: 05/06/2024 Discharge Date:No discharge date for patient encounter. Primary Care Provider: Clement Bigness, FNP Preferred Pharmacy: [facility] - [place], [place] - [address] - PHONE: [phone] [place]: [phone]   Unplanned Readmission Score:  20.94 Discharge address: [address] [place] [address] Discharge Transportation: Caregiver  Discharge Barriers Discharge Barriers Identified  : Other (comment)  Discharge Plan Discharge Disposition: Home Health Care          Anticipated Discharge Location: Home with Home Health  If Plan A discharging location is not feasible: Potential Plan B: Home with Home Health   Patient Choice Information: Patient Choice provided: Provided and reviewed by patient/family and selection has been made.   Caregiver at home: paid caregiver  Discharge Readiness:  Discharge Plan was discussed with Family Any concerns with discharge plan: No concerns verbalized with current anticipated discharge plan    Durable Medical Equipment Coordination Status: Coordination complete.       Final Destination Choice:    Final Assessment Narrative: DC home w/ HHC. Patient booked with [facility] RN/PT. Spoke to caregiver regarding dc and caregiver declined RW and [place] reports she will lend hers to patient. IM Completed with patient via t/c and she remains agreeable w dc plan.        Post Acute Referral Placement     Post Acute Referral Detail  filed by Provider Not In System at 05/13/2024  2:57 PM / Draft: Not Electronically Signed     Author: Provider Not In System Author Type: Physician Filed: 05/13/2024  2:57 PM   Status: [place] Transcription Editor: Intf,Ah Doc In [id number]   Trans ID: [id number] DME Dictation Time:  Trans Time:  Trans Doc Type: Post Acute Referral  Detail - [place] Trans Status: Unavailable           Assessment Completed by: Loveleen Multanie

## 2024-05-13 NOTE — Discharge Summary (Signed)
 "  Hospital Medicine Discharge Summary   Demographics: [name] [name]  73 y.o. 01-Mar-1951 MRN: [id number]    Extended Emergency Contact Information Primary Emergency Contact: Drury,Jill Mobile Phone: [phone] Relation: Sister  Full Code  Admit Date: 05/06/2024                            Attending Physician: Casimiro JAYSON Seidel, MD Discharge Date: 05/13/2024  Primary Care Provider: Clement Bigness, FNP   [phone]  Consults during this admission: Consult Orders             IP CONSULT TO HOSPITALIST       Provider:  (Not yet assigned)             Active & Resolved Diagnosis: Principal Problem:   Orthostatic hypotension Active Problems:   Recurrent falls   Hypokalemia   Acute kidney injury   Compression fracture of L1 vertebra with routine healing   Orthostatic dizziness Resolved Problems:   * No resolved hospital problems. *  Disposition: Patient discharged to Home in fair condition.    Scheduled Future Appointments       Provider Department Dept Phone Center   07/08/2024 9:30 AM [facility] [phone] [place]   07/08/2024 10:45 AM [facility] [phone] [place]   08/14/2024 8:20 AM Clement Bertell Bigness [facility] [phone] [address]   02/16/2025 10:00 AM [facility] DEVICE 1 [facility] [phone] [facility]      Hospital Course: [name] Innocent. [name] is a 73 year old female with a history of orthostatic hypotension, bradycardia status post pacemaker placement, hypertension, hyperlipidemia, hypothyroidism, and significant dementia, who was admitted on 05/06/2024 due to multiple falls associated with dizziness and profound weakness. Her admission was prompted by approximately four falls without loss of consciousness or syncope, and she was found to be persistently orthostatic on  presentation.  The principal problem during this hospitalization was orthostatic hypotension, which was attributed to continued use of olmesartan -HCTZ despite prior instructions to discontinue antihypertensives, as well as underlying comorbidities. Evaluation included serial orthostatic vital signs, laboratory studies revealing persistent hypokalemia, and imaging to rule out acute injuries from falls (CT head, CT lumbar spine, and X-rays). Imaging identified an L1 vertebral body compression fracture with 50% loss of vertebral body height, but no acute intracranial or hip pathology.  Management of orthostatic hypotension included discontinuation of amlodipine  and, later, permanent discontinuation of olmesartan -HCTZ. She received intravenous fluids, initiation and titration of midodrine, and continuation of fludrocortisone , with ongoing monitoring of orthostatic vital signs. Despite these interventions, she remained significantly orthostatic at the time of discharge planning, requiring further adjustment of midodrine and fludrocortisone  dosing.  Her hypokalemia was persistent and attributed to olmesartan -HCTZ, requiring escalation from oral to intravenous potassium replacement and dietary potassium supplementation. Magnesium  levels remained within normal limits throughout the admission. Her acute kidney injury improved with intravenous hydration and discontinuation of antihypertensives, with creatinine trending down during the hospital course.  She was also found to have severe protein-calorie malnutrition, with significant weight loss over the preceding months and evidence of muscle and fat depletion on nutrition-focused physical exam. Nutrition interventions included a Mediterranean diet, high-calorie/protein supplements (Thrive Gelato), and ongoing dietitian involvement; Ensure Clear was discontinued due to poor tolerance. Her weight increased during the admission, and her oral intake improved with these  interventions.  Physical  therapy evaluation revealed significant deficits in activity tolerance, balance, and mobility, with improvement over the course of hospitalization. She required a TLSO brace for the L1 compression fracture and was recommended for home physical therapy with a rolling walker and single-point cane at discharge.  Other issues addressed during the admission included pain management related to the L1 compression fracture, fall precautions, and ongoing management of chronic comorbidities such as hypothyroidism and dementia. Discharge planning involved coordination for home health services, durable medical equipment, and transportation, with the patient expected to return to her prior level of function with support.  In summary, [name] Schaff. [name] was admitted for orthostatic hypotension with recurrent falls, complicated by persistent hypokalemia, acute kidney injury, L1 compression fracture, and severe malnutrition. Her management included discontinuation of olmesartan -HCTZ, intravenous fluids, potassium replacement, initiation and titration of midodrine, nutrition support, and physical therapy. She demonstrated clinical improvement but remained orthostatic at discharge, with plans for ongoing therapy and support at home.  Assessment & Plan Orthostatic hypotension -Etiology is multifactorial including use of antihypertensives, severe malnutrition and underweight -Discontinue all anti-hypertensive medications permanently - Increase dose of  florinef  to 0.2 mg daily - Continue midodrine 10 mg 3 times daily  Hypokalemia - Still present - Will discharge home on potassium supplements Recurrent falls -Likely as a result of orthostatic hypotension.  She will be placed on fall precautions.  - PT/OT recommends single-point cane for home use and rolling walker for when she is outdoors Acute kidney injury Now resolved with IV hydration - Discontinue ARB permanently Compression fracture of  L1 vertebra with routine healing TSLO brace while standing or walking.pain control.f/u neurosurgery as out pt. Orthostatic dizziness          Wound / Incision Assessment: Refer to Chart Review and Media Tab for images if available.     Vital Sign Range:  Temp:  [97.9 F (36.6 C)-98.6 F (37 C)] 98.1 F (36.7 C) Heart Rate:  [46-64] 46 Resp:  [16-18] 18 BP: (85-152)/(52-86) 85/53      Discharge Medications     Unreviewed Medications      Sig Disp Refill Start End  amLODIPine  10 mg tablet Commonly known as: NORVASC   Take 10 mg by mouth daily.   0     busPIRone  5 mg tablet Commonly known as: BUSPAR   Take 5 mg by mouth in the morning and 5 mg at noon and 5 mg in the evening.   0     donepeziL  5 mg tablet Commonly known as: ARICEPT   Take 5 mg by mouth at bedtime.   0     fludrocortisone  0.1 mg tablet Commonly known as: FLORINEF   Take 1 tablet (0.1 mg total) by mouth daily.  90 tablet  0     levothyroxine  75 mcg tablet Commonly known as: SYNTHROID   Take 75 mcg by mouth every morning.   0     memantine  10 mg tablet Commonly known as: NAMENDA   Take 10 mg by mouth 2 (two) times a day.   0     olmesartan -hydroCHLOROthiazide  40-25 mg per tablet Commonly known as: BENICAR  HCT  Take 1 tablet by mouth daily.   0     potassium chloride  20 mEq ER tablet  Take 1 tablet (20 mEq total) by mouth daily.  90 tablet  0     rosuvastatin  40 mg tablet Commonly known as: CRESTOR   Take 40 mg by mouth daily.   0         Discharge Orders  Cane     Details:    Height (in inches): 1.499 m (4' 11.02)   Weight (in lbs.): 44.1 kg (97 lb 3.6 oz)   Cane Type: Straight   Length of need: Lifetime   Certification of Homebound Status Note     Comments: Certification of Homebound Status  I attest that I or another qualified licensed provider saw Candia Kingsbury Brickell days prior to or 30 days post admission and this face to face encounter meets the necessary Home Health  requirements. The face to face encounter occurred on (date) 05/12/24.  The encounter with the patient was in whole, or in part, for the following medical condition, which is the primary reason for home health care. (List medical condition)  Orthostatic hypotension   I certify that, based on my findings, the following services are medically necessary skilled home health services: Strengthening Exercises and Evaluate.  Further, I certify that my clinical findings support this patient's homebound status (i.e. absences from home require considerable and taxing effort, are for health treatment, or for attendance at religious events; absences from home for nonmedical reasons are infrequent or are of relatively short duration).  The clinical findings that support the need for home care and homebound status are due to illness and the patient has a condition such that leaving their home is medically contraindicated.  There exists a normal inability to leave home and leaving home requires a considerable and taxing effort including worsening clinical course   Home Health Skilled Nursing     Details:    Home Health Skilled Nursing Service:  Medication Cardiopulmonary Assessment     Medication Education: Observation and Assessment with Medication Education and Mgmt   Home Health Patient Choice Offered: Yes   Home Health Patient Preference One: No preference   Physical Therapy Home Health Coordination     Details:    Physical Therapy to Improve:  Range of Motion Balance Equipment Management Transfers Mobility Gait Training Patient Caregiver Education Safety     Home Health Patient Choice Offered: Yes   Home Health Patient Preference One: No preference   Walker     Details:    Height (in inches): 1.499 m (4' 11.02)   Weight (in lbs.): 44.1 kg (97 lb 3.6 oz)   Walker Options: Rolling   Length of need: Lifetime         Lab Results  Component Value Date/Time   HGB 10.8 05/12/2024 07:23 AM    HCT 32 (L) 05/12/2024 07:23 AM   WBC 6.46 05/12/2024 07:23 AM   PLT 243 05/12/2024 07:23 AM   Lab Results  Component Value Date/Time   NA 142 05/13/2024 10:54 AM   K 3.2 (L) 05/13/2024 10:54 AM   CREATININE 0.94 05/13/2024 10:54 AM   BUN 16 05/13/2024 10:54 AM   GLUCOSE 78 05/13/2024 10:54 AM    Pertinent Imaging: XR Spine Lumbar 2-3 Views  Final Result by Desiderio Charlie Breslow, MD (12/24 0005)  DATE OF SERVICE:  05/07/2024 12:03 am    EXAM:  XR SPINE LUMBAR 2-3 VIEWS    CLINICAL HISTORY:  Need TLSO per NSGY    COMPARISON:  CT SPINE LUMBAR WO CONTRAST dated 05/06/2024    FINDINGS:  L1 vertebral body redemonstrated with patient in TLSO without change in   alignment.    IMPRESSION:  L1 vertebral body fracture with patient in TLSO without change in   alignment.    THIS IS AN ELECTRONICALLY VERIFIED FINAL REPORT  05/07/2024 12:05 AM -  Electronically signed by Desiderio Breslow   Workstation: [id number]    XR Chest 2 Views  Final Result by Desiderio Charlie Breslow, MD (12/23 2112)  DATE OF SERVICE:  05/06/2024 9:05 pm    EXAM:  XR CHEST 2 VIEWS    CLINICAL HISTORY:  Fever    COMPARISON:  CT CHEST WO CONTRAST dated 04/22/2024    FINDINGS:  The lungs are clear. No pneumothorax or pleural effusion. Dual lead   cardiac pacemaker in place via left subclavian approach. The heart size is   normal. No acute osseous abnormality.    IMPRESSION:  No radiographic evidence of acute cardiopulmonary disease.    THIS IS AN ELECTRONICALLY VERIFIED FINAL REPORT  05/06/2024 9:12 PM - Electronically signed by Desiderio Breslow   Workstation: [id number]  [facility]    XR Hips Bilateral 2 Vw With Or Without Pelvis  Final Result by Desiderio Charlie Breslow, MD (12/23 2111)  DATE OF SERVICE:  05/06/2024 9:05 pm    EXAM:  XR HIPS BILATERAL 2 VW WITH OR WITHOUT PELVIS    CLINICAL HISTORY:  fall    COMPARISON:  No comparisons    FINDINGS:  No  evidence of fracture or malalignment. Bilateral hips are located. SI   joints are symmetric. No evidence of fracture.    IMPRESSION:  No acute osseous abnormality.        THIS IS AN ELECTRONICALLY VERIFIED FINAL REPORT  05/06/2024 9:11 PM - Electronically signed by Desiderio Breslow   Workstation: [id number]  [facility]    CT Head WO Contrast W Quant CT Tiss Character When Performed  Final Result by Desiderio Charlie Breslow, MD (12/23 2038)  DATE OF SERVICE:  05/06/2024 8:29 pm    EXAM:  CT HEAD WO CONTRAST W QUANT CT TISS CHARACTER WHEN PERFORMED    CLINICAL HISTORY:  dizzy, falls    COMPARISON:  CT HEAD WO CONTRAST dated 12/06/2023    TECHNIQUE:  CT of the head performed without contrast.  One or more of the following   dose reduction techniques were used: Automated exposure control,   adjustment of the mA and/or KV according to patient size, and use of   iterative reconstruction technique. Number of CT scans and nuclear cardiac   studies in the past 12 months: 5.  For emergent non-contrast CT, axial source images were post processed by   an independent cloud-based third-party software platform to characterize,   quantify, and localize cerebral tissue to help expedite triage, transfer   and treatment decisions for patients with suspected intracranial   hemorrhage as needed.    FINDINGS:  No evidence of acute intracranial abnormality. Specifically, no evidence   of mass, hemorrhage or areas of territorial ischemia. Mild cerebral   atrophy visualized paranasal sinuses are clear. No evidence of fracture or   acute osseous abnormality.    IMPRESSION:  Mild atrophy without evidence of acute intracranial abnormality.      THIS IS AN ELECTRONICALLY VERIFIED FINAL REPORT  05/06/2024 8:38 PM - Electronically signed by Desiderio Breslow   Workstation: [id number]  [facility]    CT Spine Cervical WO Contrast  Final Result by Desiderio Charlie Breslow, MD (12/23 2040)   DATE OF SERVICE:  05/06/2024 8:29 pm    EXAM:  CT SPINE CERVICAL WO CONTRAST    CLINICAL HISTORY:  back pain, falls    COMPARISON:  CT SPINE CERVICAL WO CONTRAST dated 12/06/2023    TECHNIQUE:  CT of the cervical spine performed  without contrast. One or more of the   following dose reduction techniques were used: Automated exposure control,   adjustment of the mA and/or KV according to patient size, and use of   iterative reconstruction technique.  Number of CT scans and nuclear cardiac studies in the past 12 months: 5.    FINDINGS:  Normal alignment. No evidence of fracture. There is C5-C6 and C6-C7   posterior disc osteophyte complexes. Emphysematous changes at the lung   apices. Pacer leads are partially imaged.  Prevertebral soft tissues appear grossly normal.    IMPRESSION:  Cervical spondylosis without acute osseous abnormality.    THIS IS AN ELECTRONICALLY VERIFIED FINAL REPORT  05/06/2024 8:40 PM - Electronically signed by Desiderio Breslow   Workstation: [id number]  [facility]    CT Spine Lumbar WO Contrast  Final Result by Desiderio Charlie Breslow, MD (12/23 2042)  DATE OF SERVICE:  05/06/2024 8:29 pm    EXAM:  CT SPINE LUMBAR WO CONTRAST    CLINICAL HISTORY:  fall, back pain    COMPARISON:  CT CHEST WO CONTRAST dated 04/22/2024    TECHNIQUE:  CT of the lumbar spine performed without contrast.  One or more of the   following dose reduction techniques were used: Automated exposure control,   adjustment of the mA and/or KV according to patient size, and use of   iterative reconstruction technique.  Number of CT scans and nuclear cardiac studies in the past 12 months: 5.    FINDINGS:  Paraspinal tissues:  There is a 10 mm stone in the left renal collecting system.  Severe aortic atherosclerosis.    Osseous structures:  There is an L1 vertebral body compression fracture with 50% loss of   vertebral body height. Minimal retropulsion of fragments  without   significant central canal stenosis. No additional fracture. Posterior   elements appear to be intact. SI joints are symmetric bilaterally.    IMPRESSION:  1. L1 vertebral body compression fracture with 50% loss of vertebral body   height.  2. Left nephrolithiasis.      THIS IS AN ELECTRONICALLY VERIFIED FINAL REPORT  05/06/2024 8:42 PM - Electronically signed by Desiderio Breslow   Workstation: [id number]  [facility]      Predictive Model Details        18.5% (Medium)  Factor Value   Calculated 05/13/2024 12:05 23% Number of hospitalizations in last year 3   Readmission Risk Score v2 Model 7% Diagnosis of fluid or electrolyte disorder present    7% Latest RDW in last 72 hrs 14.3 %    5% Braden score 19    5% Latest hemoglobin in last 72 hrs 10.8 g/dL     Electronically signed by: Casimiro JAYSON Seidel, MD 05/13/2024 2:31 PM   Time spent on discharge: 45 minutes  "

## 2024-05-14 ENCOUNTER — Telehealth: Payer: Self-pay | Admitting: *Deleted

## 2024-05-14 NOTE — Transitions of Care (Post Inpatient/ED Visit) (Signed)
" ° °  05/14/2024  Name: [name] [name] MRN: [id number] DOB: March 14, 1951  Today's TOC FU Call Status: Today's TOC FU Call Status:: Successful TOC FU Call Completed TOC FU Call Complete Date: 05/14/24  Patient's Name and Date of Birth confirmed. Name, DOB  Transition Care Management Follow-up Telephone Call Date of Discharge: 05/13/24 Discharge Facility: Other (Non[facility])  RN spoke with sister who lives in [place]. She has HC POA. Patient has changed PCP to [facility], Clement Carnes, FNP   in [place]. Patient has moved to [facility].       Cathlean Headland BSN RN [place] [facility] Health Care Management Coordinator [email or web address] Direct Dial: [phone]  Fax: [phone] Website: [email or web address]  "

## 2024-05-26 ENCOUNTER — Other Ambulatory Visit: Payer: Self-pay | Admitting: Internal Medicine

## 2024-05-27 NOTE — Telephone Encounter (Signed)
 Requested medication (s) are due for refill today: Yes  Requested medication (s) are on the active medication list: Yes  Last refill:  04/04/24  Future visit scheduled: No  Notes to clinic:  Pt. Has established care with new provider.    Requested Prescriptions  Pending Prescriptions Disp Refills   donepezil  (ARICEPT ) 5 MG tablet [Pharmacy Med Name: donepezil  5 mg tablet] 90 tablet 0    Sig: TAKE ONE TABLET (5MG  TOTAL) BY MOUTH DAILY AT 9 PM AT BEDTIME     Neurology:  Alzheimer's Agents Failed - 05/27/2024 12:26 PM      Failed - Valid encounter within last 6 months    Recent Outpatient Visits           7 months ago Encounter for general adult medical examination with abnormal findings   [place] [facility] [place], Angeline ORN, NP   10 months ago Pure hypercholesterolemia    [facility] [place], Angeline ORN, [place]

## 2024-05-28 ENCOUNTER — Other Ambulatory Visit: Payer: Self-pay

## 2024-06-12 ENCOUNTER — Other Ambulatory Visit: Payer: Self-pay | Admitting: Internal Medicine

## 2024-06-12 NOTE — Telephone Encounter (Signed)
 Copied from CRM #[id number]. Topic: Clinical - Medication Refill >> Clydean 29, 2026  9:54 AM Selinda RAMAN wrote: Medication: amLODipine  (NORVASC ) 10 MG tablet, busPIRone  (BUSPAR ) 5 MG tablet, memantine  (NAMENDA ) 10 MG tablet, olmesartan -hydrochlorothiazide  (BENICAR  HCT) 40-25 MG tablet, rosuvastatin  (CRESTOR ) 40 MG tablet  Has the patient contacted their pharmacy? Yes   This is the patient's preferred pharmacy:    [facility] [place] - [place] - [address] [place] [address] Phone: [phone] Fax: [phone]  Is this the correct pharmacy for this prescription? Yes If no, delete pharmacy and type the correct one.   Has the prescription been filled recently? No  Is the patient out of the medication? No  Has the patient been seen for an appointment in the last year OR does the patient have an upcoming appointment? Yes  Can we respond through MyChart? Yes  Please assist patient further as [place] from [facility] said the patient told him she is out of all 5 of these medications.

## 2024-06-13 MED ORDER — ROSUVASTATIN CALCIUM 40 MG PO TABS: 40.0000 mg | ORAL_TABLET | Freq: Every day | ORAL | 0 refills | Status: AC

## 2024-06-13 MED ORDER — OLMESARTAN MEDOXOMIL-HCTZ 40-25 MG PO TABS: 1.0000 | ORAL_TABLET | Freq: Every day | ORAL | 0 refills | Status: AC

## 2024-06-13 MED ORDER — BUSPIRONE HCL 5 MG PO TABS: 5.0000 mg | ORAL_TABLET | Freq: Three times a day (TID) | ORAL | 0 refills | Status: AC

## 2024-06-13 MED ORDER — AMLODIPINE BESYLATE 10 MG PO TABS: 10.0000 mg | ORAL_TABLET | Freq: Every day | ORAL | 0 refills | Status: AC

## 2024-06-13 MED ORDER — MEMANTINE HCL 10 MG PO TABS: 10.0000 mg | ORAL_TABLET | Freq: Two times a day (BID) | ORAL | 0 refills | Status: AC

## 2024-06-13 NOTE — Telephone Encounter (Signed)
 OFFICE VISIT NEEDED FOR ADDITIONAL REFILLS  Courtesy refill given.  Requested Prescriptions  Pending Prescriptions Disp Refills   amLODipine  (NORVASC ) 10 MG tablet 30 tablet 0    Sig: Take 1 tablet (10 mg total) by mouth daily.     Cardiovascular: Calcium  Channel Blockers 2 Failed - 06/13/2024  8:25 AM      Failed - Valid encounter within last 6 months    Recent Outpatient Visits           8 months ago Encounter for general adult medical examination with abnormal findings   [place] [facility] [place], Angeline ORN, NP   11 months ago Pure hypercholesterolemia   [facility] [place], [place] W, NP              Passed - Last BP in normal range    BP Readings from Last 1 Encounters:  10/11/23 90/60         Passed - Last Heart Rate in normal range    Pulse Readings from Last 1 Encounters:  11/22/22 69          memantine  (NAMENDA ) 10 MG tablet 60 tablet 0    Sig: Take 1 tablet (10 mg total) by mouth 2 (two) times daily.     Neurology:  Alzheimer's Agents 2 Failed - 06/13/2024  8:25 AM      Failed - Cr in normal range and within 360 days    Creat  Date Value Ref Range Status  10/11/2023 1.11 (H) 0.60 - 1.00 mg/dL Final         Failed - Valid encounter within last 6 months    Recent Outpatient Visits           8 months ago Encounter for general adult medical examination with abnormal findings   [place] [facility] [place], Angeline ORN, NP   11 months ago Pure hypercholesterolemia    [facility] [place], [place], NP              Passed - eGFR is 5 or above and within 360 days    GFR calc Af [place]  Date Value Ref Range Status  01/01/2020 >60 >60 mL/min Final   GFR calc non Af Amer  Date Value Ref Range Status  01/01/2020 >60 >60 mL/min Final   GFR  Date Value Ref Range Status  07/29/2020 86.13 >60.00 mL/min Final    Comment:    Calculated using the CKD-EPI Creatinine Equation  (2021)   eGFR  Date Value Ref Range Status  10/11/2023 53 (L) > OR = 60 mL/min/1.56m2 Final          olmesartan -hydrochlorothiazide  (BENICAR  HCT) 40-25 MG tablet 30 tablet 0    Sig: Take 1 tablet by mouth daily.     Cardiovascular: ARB + Diuretic Combos Failed - 06/13/2024  8:25 AM      Failed - K in normal range and within 180 days    Potassium  Date Value Ref Range Status  10/11/2023 4.2 3.5 - 5.3 mmol/L Final         Failed - Na in normal range and within 180 days    Sodium  Date Value Ref Range Status  10/11/2023 133 (L) 135 - 146 mmol/L Final         Failed - Cr in normal range and within 180 days    Creat  Date Value Ref Range Status  10/11/2023 1.11 (  H) 0.60 - 1.00 mg/dL Final         Failed - eGFR is 10 or above and within 180 days    GFR calc Af Amer  Date Value Ref Range Status  01/01/2020 >60 >60 mL/min Final   GFR calc non Af Amer  Date Value Ref Range Status  01/01/2020 >60 >60 mL/min Final   GFR  Date Value Ref Range Status  07/29/2020 86.13 >60.00 mL/min Final    Comment:    Calculated using the CKD-EPI Creatinine Equation (2021)   eGFR  Date Value Ref Range Status  10/11/2023 53 (L) > OR = 60 mL/min/1.20m2 Final         Failed - Valid encounter within last 6 months    Recent Outpatient Visits           8 months ago Encounter for general adult medical examination with abnormal findings   [place] [facility] [place], Angeline ORN, NP   11 months ago Pure hypercholesterolemia   [place] [facility] [place], [place], [place]              Ejddzi - Patient is not pregnant      Passed - Last BP in normal range    BP Readings from Last 1 Encounters:  10/11/23 90/60          rosuvastatin  (CRESTOR ) 40 MG tablet 30 tablet 0    Sig: Take 1 tablet (40 mg total) by mouth daily.     Cardiovascular:  Antilipid - Statins 2 Failed - 06/13/2024  8:25 AM      Failed - Cr in normal range and within 360 days    Creat   Date Value Ref Range Status  10/11/2023 1.11 (H) 0.60 - 1.00 mg/dL Final         Failed - Lipid Panel in normal range within the last 12 months    Cholesterol  Date Value Ref Range Status  10/11/2023 188 <200 mg/dL Final   LDL Cholesterol (Calc)  Date Value Ref Range Status  10/11/2023 125 (H) mg/dL (calc) Final    Comment:    Reference range: <100 . Desirable range <100 mg/dL for primary prevention;   <70 mg/dL for patients with CHD or diabetic patients  with > or = 2 CHD risk factors. SABRA LDL-C is now calculated using the Martin-Hopkins  calculation, which is a validated novel method providing  better accuracy than the Friedewald equation in the  estimation of LDL-C.  Gladis APPLETHWAITE et al. SANDREA. 7986;689(80): 2061-2068  (http://education.[facility].com/faq/FAQ164)    HDL  Date Value Ref Range Status  10/11/2023 38 (L) > OR = 50 mg/dL Final   Triglycerides  Date Value Ref Range Status  10/11/2023 133 <150 mg/dL Final         Passed - Patient is not pregnant      Passed - Valid encounter within last 12 months    Recent Outpatient Visits           8 months ago Encounter for general adult medical examination with abnormal findings   [place] [facility] [place], Angeline ORN, NP   11 months ago Pure hypercholesterolemia   [place] [facility] [place], [place], NP               busPIRone  (BUSPAR ) 5 MG tablet 90 tablet 0    Sig: Take 1 tablet (5 mg total) by mouth 3 (three) times  daily.     Psychiatry: Anxiolytics/Hypnotics - Non-controlled Passed - 06/13/2024  8:25 AM      Passed - Valid encounter within last 12 months    Recent Outpatient Visits           8 months ago Encounter for general adult medical examination with abnormal findings   [place] [facility] [place], Angeline ORN, NP   11 months ago Pure hypercholesterolemia   [place] [facility] [place], Angeline ORN, [place]

## 2024-06-19 ENCOUNTER — Other Ambulatory Visit: Payer: Self-pay | Admitting: Internal Medicine

## 2024-06-19 NOTE — Telephone Encounter (Unsigned)
 Copied from CRM #[id number]. Topic: Clinical - Medication Refill >> Jun 19, 2024  5:00 PM Tysheama G wrote: Medication:   amLODipine  (NORVASC ) 10 MG tablet busPIRone  (BUSPAR ) 5 MG tablet memantine  (NAMENDA ) 10 MG tablet olmesartan -hydrochlorothiazide  (BENICAR  HCT) 40-25 MG tablet rosuvastatin  (CRESTOR ) 40 MG tablet   Has the patient contacted their pharmacy? Yes (Agent: If no, request that the patient contact the pharmacy for the refill. If patient does not wish to contact the pharmacy document the reason why and proceed with request.) (Agent: If yes, when and what did the pharmacy advise?)  This is the patient's preferred pharmacy:    [facility] [place] - [place], [place] - [address] [place] [address] Phone: [phone] Fax: [phone]  Is this the correct pharmacy for this prescription? Yes If no, delete pharmacy and type the correct one.   Has the prescription been filled recently? No  Is the patient out of the medication? Yes  Has the patient been seen for an appointment in the last year OR does the patient have an upcoming appointment? Yes  Can we respond through MyChart? Yes  Agent: Please be advised that Rx refills may take up to 3 business days. We ask that you follow-up with your pharmacy.

## 2024-06-20 ENCOUNTER — Other Ambulatory Visit: Payer: Self-pay | Admitting: Internal Medicine

## 2024-06-20 NOTE — Telephone Encounter (Signed)
 Requested Prescriptions  Refused Prescriptions Disp Refills   amLODipine  (NORVASC ) 10 MG tablet 30 tablet 0    Sig: Take 1 tablet (10 mg total) by mouth daily.     Cardiovascular: Calcium  Channel Blockers 2 Failed - 06/20/2024  3:59 PM      Failed - Valid encounter within last 6 months    Recent Outpatient Visits           8 months ago Encounter for general adult medical examination with abnormal findings   [place] [facility] [place], Angeline ORN, NP   11 months ago Pure hypercholesterolemia   [place] [facility] [place], [place] W, NP              Passed - Last BP in normal range    BP Readings from Last 1 Encounters:  10/11/23 90/60         Passed - Last Heart Rate in normal range    Pulse Readings from Last 1 Encounters:  11/22/22 69          busPIRone  (BUSPAR ) 5 MG tablet 90 tablet 0    Sig: Take 1 tablet (5 mg total) by mouth 3 (three) times daily.     Psychiatry: Anxiolytics/Hypnotics - Non-controlled Passed - 06/20/2024  3:59 PM      Passed - Valid encounter within last 12 months    Recent Outpatient Visits           8 months ago Encounter for general adult medical examination with abnormal findings   [place] [facility] [place], Angeline ORN, NP   11 months ago Pure hypercholesterolemia   [place] [facility] [place], [place], NP               memantine  (NAMENDA ) 10 MG tablet 60 tablet 0    Sig: Take 1 tablet (10 mg total) by mouth 2 (two) times daily.     Neurology:  Alzheimer's Agents 2 Failed - 06/20/2024  3:59 PM      Failed - Cr in normal range and within 360 days    Creat  Date Value Ref Range Status  10/11/2023 1.11 (H) 0.60 - 1.00 mg/dL Final         Failed - Valid encounter within last 6 months    Recent Outpatient Visits           8 months ago Encounter for general adult medical examination with abnormal findings   [facility] [place], Angeline ORN, NP    11 months ago Pure hypercholesterolemia   [place] [facility] [place], [place], NP              Passed - eGFR is 5 or above and within 360 days    GFR calc Af Amer  Date Value Ref Range Status  01/01/2020 >60 >60 mL/min Final   GFR calc non Af Amer  Date Value Ref Range Status  01/01/2020 >60 >60 mL/min Final   GFR  Date Value Ref Range Status  07/29/2020 86.13 >60.00 mL/min Final    Comment:    Calculated using the CKD-EPI Creatinine Equation (2021)   eGFR  Date Value Ref Range Status  10/11/2023 53 (L) > OR = 60 mL/min/1.30m2 Final          olmesartan -hydrochlorothiazide  (BENICAR  HCT) 40-25 MG tablet 30 tablet 0    Sig: Take 1 tablet by mouth daily.  Cardiovascular: ARB + Diuretic Combos Failed - 06/20/2024  3:59 PM      Failed - K in normal range and within 180 days    Potassium  Date Value Ref Range Status  10/11/2023 4.2 3.5 - 5.3 mmol/L Final         Failed - Na in normal range and within 180 days    Sodium  Date Value Ref Range Status  10/11/2023 133 (L) 135 - 146 mmol/L Final         Failed - Cr in normal range and within 180 days    Creat  Date Value Ref Range Status  10/11/2023 1.11 (H) 0.60 - 1.00 mg/dL Final         Failed - eGFR is 10 or above and within 180 days    GFR calc Af Amer  Date Value Ref Range Status  01/01/2020 >60 >60 mL/min Final   GFR calc non Af Amer  Date Value Ref Range Status  01/01/2020 >60 >60 mL/min Final   GFR  Date Value Ref Range Status  07/29/2020 86.13 >60.00 mL/min Final    Comment:    Calculated using the CKD-EPI Creatinine Equation (2021)   eGFR  Date Value Ref Range Status  10/11/2023 53 (L) > OR = 60 mL/min/1.58m2 Final         Failed - Valid encounter within last 6 months    Recent Outpatient Visits           8 months ago Encounter for general adult medical examination with abnormal findings   [place] [facility] [place], Angeline ORN, NP   11 months ago  Pure hypercholesterolemia   [place] [facility] [place], Angeline ORN, [place]              Passed - Patient is not pregnant      Passed - Last BP in normal range    BP Readings from Last 1 Encounters:  10/11/23 90/60          rosuvastatin  (CRESTOR ) 40 MG tablet 30 tablet 0    Sig: Take 1 tablet (40 mg total) by mouth daily.     Cardiovascular:  Antilipid - Statins 2 Failed - 06/20/2024  3:59 PM      Failed - Cr in normal range and within 360 days    Creat  Date Value Ref Range Status  10/11/2023 1.11 (H) 0.60 - 1.00 mg/dL Final         Failed - Lipid Panel in normal range within the last 12 months    Cholesterol  Date Value Ref Range Status  10/11/2023 188 <200 mg/dL Final   LDL Cholesterol (Calc)  Date Value Ref Range Status  10/11/2023 125 (H) mg/dL (calc) Final    Comment:    Reference range: <100 . Desirable range <100 mg/dL for primary prevention;   <70 mg/dL for patients with CHD or diabetic patients  with > or = 2 CHD risk factors. SABRA LDL-C is now calculated using the Martin-Hopkins  calculation, which is a validated novel method providing  better accuracy than the Friedewald equation in the  estimation of LDL-C.  Gladis APPLETHWAITE et al. SANDREA. 7986;689(80): 2061-2068  (http://education.[facility].com/faq/FAQ164)    HDL  Date Value Ref Range Status  10/11/2023 38 (L) > OR = 50 mg/dL Final   Triglycerides  Date Value Ref Range Status  10/11/2023 133 <150 mg/dL Final         Passed - Patient  is not pregnant      Passed - Valid encounter within last 12 months    Recent Outpatient Visits           8 months ago Encounter for general adult medical examination with abnormal findings   [place] [facility] [place], Angeline ORN, NP   11 months ago Pure hypercholesterolemia   [place] [facility] [place], Angeline ORN, [place]
